# Patient Record
Sex: Female | Born: 1965 | Race: White | Hispanic: No | Marital: Single | Smoking: Former smoker
Health system: Southern US, Community
[De-identification: ages and names within clinical notes are randomized; demographics above are authoritative.]

## PROBLEM LIST (undated history)

## (undated) DIAGNOSIS — F329 Major depressive disorder, single episode, unspecified: Secondary | ICD-10-CM

## (undated) DIAGNOSIS — K589 Irritable bowel syndrome without diarrhea: Secondary | ICD-10-CM

## (undated) DIAGNOSIS — E7800 Pure hypercholesterolemia, unspecified: Secondary | ICD-10-CM

## (undated) DIAGNOSIS — I219 Acute myocardial infarction, unspecified: Secondary | ICD-10-CM

## (undated) DIAGNOSIS — F410 Panic disorder [episodic paroxysmal anxiety] without agoraphobia: Secondary | ICD-10-CM

## (undated) DIAGNOSIS — G43909 Migraine, unspecified, not intractable, without status migrainosus: Secondary | ICD-10-CM

## (undated) DIAGNOSIS — F419 Anxiety disorder, unspecified: Secondary | ICD-10-CM

## (undated) DIAGNOSIS — K219 Gastro-esophageal reflux disease without esophagitis: Secondary | ICD-10-CM

## (undated) HISTORY — PX: TONSILLECTOMY: SUR1361

## (undated) HISTORY — DX: Gastro-esophageal reflux disease without esophagitis: K21.9

## (undated) HISTORY — DX: Irritable bowel syndrome, unspecified: K58.9

## (undated) HISTORY — PX: CORONARY ANGIOPLASTY WITH STENT PLACEMENT: SHX49

---

## 1996-12-16 ENCOUNTER — Encounter: Payer: Self-pay | Admitting: Gastroenterology

## 2000-09-04 ENCOUNTER — Encounter: Payer: Self-pay | Admitting: Gastroenterology

## 2000-09-04 ENCOUNTER — Other Ambulatory Visit: Admission: RE | Admit: 2000-09-04 | Discharge: 2000-09-04 | Payer: Self-pay | Admitting: Gastroenterology

## 2000-09-04 ENCOUNTER — Encounter (INDEPENDENT_AMBULATORY_CARE_PROVIDER_SITE_OTHER): Payer: Self-pay | Admitting: Specialist

## 2005-09-26 ENCOUNTER — Ambulatory Visit: Payer: Self-pay | Admitting: Gastroenterology

## 2006-11-03 ENCOUNTER — Ambulatory Visit: Payer: Self-pay | Admitting: Gastroenterology

## 2007-12-30 ENCOUNTER — Ambulatory Visit: Payer: Self-pay | Admitting: Gastroenterology

## 2008-07-17 ENCOUNTER — Emergency Department (HOSPITAL_BASED_OUTPATIENT_CLINIC_OR_DEPARTMENT_OTHER): Admission: EM | Admit: 2008-07-17 | Discharge: 2008-07-17 | Payer: Self-pay | Admitting: Emergency Medicine

## 2008-10-28 ENCOUNTER — Emergency Department (HOSPITAL_BASED_OUTPATIENT_CLINIC_OR_DEPARTMENT_OTHER): Admission: EM | Admit: 2008-10-28 | Discharge: 2008-10-28 | Payer: Self-pay | Admitting: Emergency Medicine

## 2008-12-19 ENCOUNTER — Emergency Department (HOSPITAL_BASED_OUTPATIENT_CLINIC_OR_DEPARTMENT_OTHER): Admission: EM | Admit: 2008-12-19 | Discharge: 2008-12-19 | Payer: Self-pay | Admitting: Emergency Medicine

## 2009-02-20 ENCOUNTER — Ambulatory Visit: Payer: Self-pay | Admitting: Gastroenterology

## 2009-02-20 DIAGNOSIS — K219 Gastro-esophageal reflux disease without esophagitis: Secondary | ICD-10-CM | POA: Insufficient documentation

## 2009-02-20 DIAGNOSIS — K589 Irritable bowel syndrome without diarrhea: Secondary | ICD-10-CM

## 2009-03-11 ENCOUNTER — Emergency Department (HOSPITAL_BASED_OUTPATIENT_CLINIC_OR_DEPARTMENT_OTHER): Admission: EM | Admit: 2009-03-11 | Discharge: 2009-03-11 | Payer: Self-pay | Admitting: Emergency Medicine

## 2009-06-18 ENCOUNTER — Emergency Department (HOSPITAL_BASED_OUTPATIENT_CLINIC_OR_DEPARTMENT_OTHER): Admission: EM | Admit: 2009-06-18 | Discharge: 2009-06-18 | Payer: Self-pay | Admitting: Emergency Medicine

## 2009-10-15 ENCOUNTER — Emergency Department (HOSPITAL_BASED_OUTPATIENT_CLINIC_OR_DEPARTMENT_OTHER): Admission: EM | Admit: 2009-10-15 | Discharge: 2009-10-15 | Payer: Self-pay | Admitting: Emergency Medicine

## 2010-03-10 ENCOUNTER — Emergency Department (HOSPITAL_BASED_OUTPATIENT_CLINIC_OR_DEPARTMENT_OTHER): Admission: EM | Admit: 2010-03-10 | Discharge: 2010-03-10 | Payer: Self-pay | Admitting: Emergency Medicine

## 2010-09-10 ENCOUNTER — Emergency Department (HOSPITAL_BASED_OUTPATIENT_CLINIC_OR_DEPARTMENT_OTHER): Admission: EM | Admit: 2010-09-10 | Discharge: 2010-09-10 | Payer: Self-pay | Admitting: Emergency Medicine

## 2010-10-04 ENCOUNTER — Emergency Department (HOSPITAL_BASED_OUTPATIENT_CLINIC_OR_DEPARTMENT_OTHER): Admission: EM | Admit: 2010-10-04 | Discharge: 2010-10-04 | Payer: Self-pay | Admitting: Emergency Medicine

## 2010-11-02 ENCOUNTER — Ambulatory Visit (HOSPITAL_COMMUNITY)
Admission: RE | Admit: 2010-11-02 | Discharge: 2010-11-02 | Payer: Self-pay | Source: Home / Self Care | Attending: Obstetrics and Gynecology | Admitting: Obstetrics and Gynecology

## 2010-12-09 ENCOUNTER — Encounter: Payer: Self-pay | Admitting: Obstetrics and Gynecology

## 2011-01-10 ENCOUNTER — Emergency Department (HOSPITAL_BASED_OUTPATIENT_CLINIC_OR_DEPARTMENT_OTHER)
Admission: EM | Admit: 2011-01-10 | Discharge: 2011-01-10 | Disposition: A | Discharge status: Home / Self Care | Payer: PRIVATE HEALTH INSURANCE | Attending: Emergency Medicine | Admitting: Emergency Medicine

## 2011-01-10 DIAGNOSIS — G43909 Migraine, unspecified, not intractable, without status migrainosus: Secondary | ICD-10-CM | POA: Insufficient documentation

## 2011-01-10 DIAGNOSIS — K219 Gastro-esophageal reflux disease without esophagitis: Secondary | ICD-10-CM | POA: Insufficient documentation

## 2011-01-10 DIAGNOSIS — R110 Nausea: Secondary | ICD-10-CM | POA: Insufficient documentation

## 2011-01-10 DIAGNOSIS — E860 Dehydration: Secondary | ICD-10-CM | POA: Insufficient documentation

## 2011-02-02 ENCOUNTER — Emergency Department (HOSPITAL_BASED_OUTPATIENT_CLINIC_OR_DEPARTMENT_OTHER)
Admission: EM | Admit: 2011-02-02 | Discharge: 2011-02-02 | Disposition: A | Discharge status: Home / Self Care | Payer: PRIVATE HEALTH INSURANCE | Attending: Emergency Medicine | Admitting: Emergency Medicine

## 2011-02-02 DIAGNOSIS — K219 Gastro-esophageal reflux disease without esophagitis: Secondary | ICD-10-CM | POA: Insufficient documentation

## 2011-02-02 DIAGNOSIS — G43909 Migraine, unspecified, not intractable, without status migrainosus: Secondary | ICD-10-CM | POA: Insufficient documentation

## 2011-03-11 ENCOUNTER — Emergency Department (HOSPITAL_BASED_OUTPATIENT_CLINIC_OR_DEPARTMENT_OTHER)
Admission: EM | Admit: 2011-03-11 | Discharge: 2011-03-11 | Disposition: A | Discharge status: Home / Self Care | Payer: PRIVATE HEALTH INSURANCE | Attending: Emergency Medicine | Admitting: Emergency Medicine

## 2011-03-11 DIAGNOSIS — G43809 Other migraine, not intractable, without status migrainosus: Secondary | ICD-10-CM | POA: Insufficient documentation

## 2011-03-11 DIAGNOSIS — Z79899 Other long term (current) drug therapy: Secondary | ICD-10-CM | POA: Insufficient documentation

## 2011-03-11 DIAGNOSIS — K219 Gastro-esophageal reflux disease without esophagitis: Secondary | ICD-10-CM | POA: Insufficient documentation

## 2011-04-02 NOTE — Assessment & Plan Note (Signed)
[facility]                         GASTROENTEROLOGY OFFICE NOTE   Cassidy Miles, Cassidy Miles                       MRN:          [id number]  DATE:12/30/2007                            DOB:          1966/07/30    This is a return office visit for GERD and irritable bowel syndrome.  She recently ran out of Nexium and began taking Prilosec 20 mg OTC,  which has not been effective in controlling her symptoms.  She relates  no dysphagia, odynophagia or night-time reflux symptoms.  Her irritable  bowel syndrome has been relatively inactive.  She uses Librax on the  order of two to three times a month as needed.  She notes no change in  bowel habits, melena, hematochezia or change in stool caliber.  There is  no family history of colon cancer, colon polyps or inflammatory bowel  disease.   CURRENT MEDICATIONS:  Listed on the chart, updated and reviewed.   MEDICATION ALLERGIES:  BIAXIN, KEFLEX, PENICILLIN.   EXAM:  No acute distress.  Weight 134.6 pounds, blood pressure is 98/62, pulse 84 and regular.  CHEST:  Clear to auscultation bilaterally.  CARDIAC:  Regular rate and rhythm without murmurs appreciated.  ABDOMEN:  Soft and nontender with normoactive bowel sounds, no palpable  organomegaly, masses or hernias.   ASSESSMENT AND PLAN:  1. GERD.  Renew Nexium 40 mg p.o. q.a.m. for one year.  Continue      standard anti-reflux measures.  Return office visit one year.  2. Irritable bowel syndrome.  Minimal activity.  Continue Librax      b.i.d. p.r.n.  Return office visit one year.     Venita Lick. Russella Dar, MD, [facility]  Electronically Signed    MTS/MedQ  DD: 12/30/2007  DT: 12/31/2007  Job #: [id number]   cc:   S. Kyra Manges, M.D.

## 2011-04-05 NOTE — Assessment & Plan Note (Signed)
[facility]                         GASTROENTEROLOGY OFFICE NOTE   Cassidy Miles, Cassidy Miles                       MRN:          [id number]  DATE:11/03/2006                            DOB:          [birth year]    REASON FOR VISIT:  This is a return office visit for GERD and irritable  bowel syndrome.  She also notes two small lesions near her anus that  have intermittently been irritated with diarrhea.  She states the  lesions have persisted for several years without any significant  changes. She had her annual pelvic exam by Dr. Elana Alm in September and  he noted these areas as well but she was unsure as to what he found.  She continues to use Nexium on a daily basis to control her reflux  symptoms.  When she misses a day or two of medication, her symptoms  recur.  She has no dysphagia, odynophagia, change in bowel habits,  melena, hematochezia or change in stool caliber.  She has had problems  with alternating diarrhea and constipation.  These symptoms have been  present for many years and are relatively stable.  She does note that  her diarrhea increases when she is under more stress.   CURRENT MEDICATIONS:  Current medications listed on the chart-updated  and reviewed.   ALLERGIES:  BIAXIN, KEFLEX, PENICILLIN.   PHYSICAL EXAMINATION:  GENERAL APPEARANCE:  No acute distress.  VITAL SIGNS:  Weight 139 pounds,  blood pressure 110/66, pulse is 64 and  regular.  CHEST:  Clear to auscultation bilaterally.  CARDIAC:  Regular rate and rhythm without murmurs.  ABDOMEN:  Soft, nontender with normal active bowel sounds, no palpable  organomegaly, masses or hernias.  RECTAL:  Examination reveals two very small external skin tags.  No  internal lesions.  Hemoccult negative stool in the vault.   ASSESSMENT/PLAN:  1. Gastroesophageal reflux disease.  Continue Nexium 40 mg p.o. q.a.m.      along with standard antireflux measures.  Return office visit in      one  year.  2. Irritable bowel syndrome.  Dicyclomine 10 mg t.i.d. p.r.n.      abdominal pain, bloating or diarrhea.  3. External hemorrhoidal tags.  4. Routine colorectal cancer screening at age 45.     Venita Lick. Russella Dar, MD, [facility]  Electronically Signed    MTS/MedQ  DD: 11/03/2006  DT: 11/03/2006  Job #: [id number]

## 2011-04-09 ENCOUNTER — Emergency Department (HOSPITAL_BASED_OUTPATIENT_CLINIC_OR_DEPARTMENT_OTHER)
Admission: EM | Admit: 2011-04-09 | Discharge: 2011-04-09 | Disposition: A | Discharge status: Home / Self Care | Payer: PRIVATE HEALTH INSURANCE | Attending: Emergency Medicine | Admitting: Emergency Medicine

## 2011-04-09 DIAGNOSIS — G43909 Migraine, unspecified, not intractable, without status migrainosus: Secondary | ICD-10-CM | POA: Insufficient documentation

## 2011-04-09 DIAGNOSIS — K219 Gastro-esophageal reflux disease without esophagitis: Secondary | ICD-10-CM | POA: Insufficient documentation

## 2011-04-29 ENCOUNTER — Emergency Department (HOSPITAL_BASED_OUTPATIENT_CLINIC_OR_DEPARTMENT_OTHER)
Admission: EM | Admit: 2011-04-29 | Discharge: 2011-04-29 | Disposition: A | Discharge status: Home / Self Care | Payer: PRIVATE HEALTH INSURANCE | Attending: Emergency Medicine | Admitting: Emergency Medicine

## 2011-04-29 DIAGNOSIS — R51 Headache: Secondary | ICD-10-CM | POA: Insufficient documentation

## 2011-04-29 DIAGNOSIS — K219 Gastro-esophageal reflux disease without esophagitis: Secondary | ICD-10-CM | POA: Insufficient documentation

## 2011-06-20 ENCOUNTER — Encounter: Payer: Self-pay | Admitting: Family Medicine

## 2011-07-21 ENCOUNTER — Emergency Department (HOSPITAL_BASED_OUTPATIENT_CLINIC_OR_DEPARTMENT_OTHER)
Admission: EM | Admit: 2011-07-21 | Discharge: 2011-07-21 | Disposition: A | Discharge status: Home / Self Care | Payer: BC Managed Care – PPO | Attending: Emergency Medicine | Admitting: Emergency Medicine

## 2011-07-21 ENCOUNTER — Encounter (HOSPITAL_BASED_OUTPATIENT_CLINIC_OR_DEPARTMENT_OTHER): Payer: Self-pay | Admitting: *Deleted

## 2011-07-21 DIAGNOSIS — G43909 Migraine, unspecified, not intractable, without status migrainosus: Secondary | ICD-10-CM | POA: Insufficient documentation

## 2011-07-21 DIAGNOSIS — K219 Gastro-esophageal reflux disease without esophagitis: Secondary | ICD-10-CM | POA: Insufficient documentation

## 2011-07-21 DIAGNOSIS — E7800 Pure hypercholesterolemia, unspecified: Secondary | ICD-10-CM | POA: Insufficient documentation

## 2011-07-21 DIAGNOSIS — K589 Irritable bowel syndrome without diarrhea: Secondary | ICD-10-CM | POA: Insufficient documentation

## 2011-07-21 HISTORY — DX: Pure hypercholesterolemia, unspecified: E78.00

## 2011-07-21 HISTORY — DX: Migraine, unspecified, not intractable, without status migrainosus: G43.909

## 2011-07-21 MED ORDER — DIPHENHYDRAMINE HCL 50 MG/ML IJ SOLN
25.0000 mg | Freq: Once | INTRAMUSCULAR | Status: AC
  Administered 2011-07-21: 25 mg via INTRAVENOUS
  Filled 2011-07-21: qty 1

## 2011-07-21 MED ORDER — DEXAMETHASONE SODIUM PHOSPHATE 10 MG/ML IJ SOLN
10.0000 mg | Freq: Once | INTRAMUSCULAR | Status: AC
  Administered 2011-07-21: 10 mg via INTRAVENOUS
  Filled 2011-07-21: qty 1

## 2011-07-21 MED ORDER — HYDROMORPHONE HCL 1 MG/ML IJ SOLN
1.0000 mg | Freq: Once | INTRAMUSCULAR | Status: AC
  Administered 2011-07-21: 1 mg via INTRAVENOUS
  Filled 2011-07-21: qty 1

## 2011-07-21 MED ORDER — SODIUM CHLORIDE 0.9 % IV SOLN: 1000.0000 mL | INTRAVENOUS | Status: DC

## 2011-07-21 MED ORDER — METOCLOPRAMIDE HCL 5 MG/ML IJ SOLN
10.0000 mg | Freq: Once | INTRAMUSCULAR | Status: AC
  Administered 2011-07-21: 10 mg via INTRAVENOUS
  Filled 2011-07-21: qty 2

## 2011-07-21 MED ORDER — SODIUM CHLORIDE 0.9 % IV BOLUS (SEPSIS)
1000.0000 mL | Freq: Once | INTRAVENOUS | Status: AC
  Administered 2011-07-21: 1000 mL via INTRAVENOUS

## 2011-07-21 MED ORDER — KETOROLAC TROMETHAMINE 30 MG/ML IJ SOLN
30.0000 mg | Freq: Once | INTRAMUSCULAR | Status: AC
  Administered 2011-07-21: 30 mg via INTRAVENOUS
  Filled 2011-07-21: qty 1

## 2011-07-21 NOTE — ED Provider Notes (Signed)
Scribed for Dr. Fonnie Jarvis, the patient was seen in room 10. This chart was scribed by Hillery Hunter. This patient's care was started at 20:20.   History   CSN: [id number] Arrival date & time: 07/21/2011  7:54 PM  Chief Complaint  Patient presents with  . Migraine   The history is provided by the patient.    Cassidy Miles is a 45 y.o. female who presents to the Emergency Department complaining of headache similar to those she associates with chronic migraine history. She describes this headache as starting yesterday and gradually worsening, feeling "like a jackhammer", had visual aura of flashing lights today but no decreased vision on right side as she has had with some previous symptoms. She has had concurrent nausea and vomiting today, and has not been able to keep down medications at home. She denies weakness, numbness, coordination, dizziness, fever, rash, trauma, shortness of breath, abdominal pain, hallucinations.    Past Medical History  Diagnosis Date  . GERD (gastroesophageal reflux disease)   . IBS (irritable bowel syndrome)   . Migraines   . Hypercholesteremia     History reviewed. No pertinent past surgical history.  History reviewed. No pertinent family history.  History  Substance Use Topics  . Smoking status: Never Smoker   . Smokeless tobacco: Not on file  . Alcohol Use: No     Review of Systems  Constitutional: Negative for fever.  HENT: Negative for neck pain.   Eyes: Positive for photophobia and visual disturbance (visual aura but no change in vision). Negative for pain.  Respiratory: Negative for cough and shortness of breath.   Cardiovascular: Negative for chest pain.  Gastrointestinal: Positive for nausea and vomiting. Negative for abdominal pain and diarrhea.  Skin: Negative for rash.  Neurological: Positive for headaches. Negative for dizziness, speech difficulty, weakness, light-headedness and numbness.  Psychiatric/Behavioral: Negative  for hallucinations and confusion.  All other systems reviewed and are negative.    Physical Exam  BP 143/89  Pulse 132  Temp(Src) 99.1 F (37.3 C) (Oral)  Resp 20  Ht 5\' 6"  (1.676 m)  Wt 160 lb (72.576 kg)  BMI 25.82 kg/m2  SpO2 100%  LMP 07/21/2011  Physical Exam  Nursing note and vitals reviewed. Constitutional:       Awake, alert, nontoxic appearance with baseline speech for patient.  HENT:  Head: Atraumatic.  Mouth/Throat: Oropharynx is clear and moist.  Eyes: EOM are normal. Pupils are equal, round, and reactive to light. Right eye exhibits no discharge. Left eye exhibits no discharge.  Neck: Neck supple.       Mild posterior neck tenderness typical for patient  Cardiovascular: Normal rate and regular rhythm.   No murmur heard. Pulmonary/Chest: Effort normal and breath sounds normal. No respiratory distress.  Abdominal: Soft. There is no tenderness.  Musculoskeletal:       Baseline ROM, moves extremities with no obvious new focal weakness.  Neurological:       Awake, alert, cooperative and aware of situation; motor strength bilaterally; sensation normal to light touch bilaterally; major cranial nerves appear intact; romberg negative  Skin: No rash noted.  Psychiatric: She has a normal mood and affect.  Neuro normal F-N, periph fields intact, normal gait  ED Course  Procedures  OTHER DATA REVIEWED: Nursing notes, vital signs, and past medical records reviewed. Patient has been seen for similar symptoms about once per month. Patient has given conflicting histories of narcotic use during prior visits.  DIAGNOSTIC STUDIES: Oxygen Saturation is 100%  on room air, normal by my interpretation.     ED COURSE / COORDINATION OF CARE: 20:26. Ordered: IV NS 1L bolus, Reglan 10mg , Dilaudid 1mg , Benadryl 25mg , Toradol 30mg , Decadron 10mg  for pain control. 21:35. Patient feels much improved and is resting comfortably. Her spouse is present to provide her a ride  home.   MDM: I doubt any other EMC precluding discharge at this time including, but not necessarily limited to the following:SAH, CVA, SBI.  IMPRESSION: Diagnoses that have been ruled out:  Diagnoses that are still under consideration:  Final diagnoses:  Migraine, unspecified, without mention of intractable migraine without mention of status migrainosus    PLAN:  Discharge home in care of spouse I have reviewed the discharge instructions with the patient  CONDITION ON DISCHARGE: Stable and improved  MEDICATIONS GIVEN IN THE E.D.  Medications  sodium chloride 0.9 % bolus 1,000 mL (1000 mL Intravenous Given 07/21/11 2103)  metoCLOPramide (REGLAN) injection 10 mg (10 mg Intravenous Given 07/21/11 2104)  HYDROmorphone (DILAUDID) injection 1 mg (1 mg Intravenous Given 07/21/11 2105)  diphenhydrAMINE (BENADRYL) injection 25 mg (25 mg Intravenous Given 07/21/11 2104)  ketorolac (TORADOL) injection 30 mg (30 mg Intravenous Given 07/21/11 2104)  dexamethasone (DECADRON) injection 10 mg (10 mg Intravenous Given 07/21/11 2104)    SCRIBE ATTESTATION: I personally performed the services described in this documentation, which was scribed in my presence. The recorded information has been reviewed and considered. No att. providers found    Hurman Horn, MD 07/22/11 1311

## 2011-07-21 NOTE — ED Notes (Signed)
Pt states she has a hx of migraines and has had this one for 3 days. Also c/o N/V. Sensitivity to light.

## 2011-09-01 ENCOUNTER — Encounter (HOSPITAL_BASED_OUTPATIENT_CLINIC_OR_DEPARTMENT_OTHER): Payer: Self-pay | Admitting: *Deleted

## 2011-09-01 ENCOUNTER — Emergency Department (HOSPITAL_BASED_OUTPATIENT_CLINIC_OR_DEPARTMENT_OTHER)
Admission: EM | Admit: 2011-09-01 | Discharge: 2011-09-01 | Disposition: A | Discharge status: Home / Self Care | Payer: BC Managed Care – PPO | Attending: Emergency Medicine | Admitting: Emergency Medicine

## 2011-09-01 DIAGNOSIS — K589 Irritable bowel syndrome without diarrhea: Secondary | ICD-10-CM | POA: Insufficient documentation

## 2011-09-01 DIAGNOSIS — F17200 Nicotine dependence, unspecified, uncomplicated: Secondary | ICD-10-CM | POA: Insufficient documentation

## 2011-09-01 DIAGNOSIS — R51 Headache: Secondary | ICD-10-CM

## 2011-09-01 DIAGNOSIS — W108XXA Fall (on) (from) other stairs and steps, initial encounter: Secondary | ICD-10-CM | POA: Insufficient documentation

## 2011-09-01 DIAGNOSIS — E7800 Pure hypercholesterolemia, unspecified: Secondary | ICD-10-CM | POA: Insufficient documentation

## 2011-09-01 DIAGNOSIS — R112 Nausea with vomiting, unspecified: Secondary | ICD-10-CM

## 2011-09-01 DIAGNOSIS — K219 Gastro-esophageal reflux disease without esophagitis: Secondary | ICD-10-CM | POA: Insufficient documentation

## 2011-09-01 MED ORDER — HYDROMORPHONE HCL 1 MG/ML IJ SOLN
INTRAMUSCULAR | Status: AC
  Administered 2011-09-01: 1 mg via INTRAVENOUS
  Filled 2011-09-01: qty 1

## 2011-09-01 MED ORDER — DIPHENHYDRAMINE HCL 50 MG/ML IJ SOLN
12.5000 mg | Freq: Once | INTRAMUSCULAR | Status: AC
  Administered 2011-09-01: 12.5 mg via INTRAVENOUS
  Filled 2011-09-01: qty 1

## 2011-09-01 MED ORDER — SODIUM CHLORIDE 0.9 % IV BOLUS (SEPSIS)
1000.0000 mL | Freq: Once | INTRAVENOUS | Status: AC
  Administered 2011-09-01: 1000 mL via INTRAVENOUS

## 2011-09-01 MED ORDER — HYDROMORPHONE HCL 1 MG/ML IJ SOLN
1.0000 mg | Freq: Once | INTRAMUSCULAR | Status: AC
  Administered 2011-09-01: 1 mg via INTRAVENOUS

## 2011-09-01 MED ORDER — KETOROLAC TROMETHAMINE 30 MG/ML IJ SOLN
30.0000 mg | Freq: Once | INTRAMUSCULAR | Status: AC
  Administered 2011-09-01: 30 mg via INTRAVENOUS
  Filled 2011-09-01: qty 1

## 2011-09-01 MED ORDER — METHYLPREDNISOLONE SODIUM SUCC 125 MG IJ SOLR
250.0000 mg | Freq: Once | INTRAMUSCULAR | Status: AC
  Administered 2011-09-01: 250 mg via INTRAVENOUS
  Filled 2011-09-01: qty 4

## 2011-09-01 MED ORDER — METOCLOPRAMIDE HCL 5 MG/ML IJ SOLN
10.0000 mg | Freq: Once | INTRAMUSCULAR | Status: AC
  Administered 2011-09-01: 10 mg via INTRAVENOUS
  Filled 2011-09-01: qty 2

## 2011-09-01 NOTE — ED Notes (Signed)
Migraine since Friday, states that she fell on steps and started hurting soon after fall, took ibuprofen and vicodin yesterday, but no relief

## 2011-09-01 NOTE — ED Notes (Signed)
Patient stated that HA has been resolved and plans to go home to sleep

## 2011-09-01 NOTE — ED Provider Notes (Signed)
History     CSN: [id number] Arrival date & time: 09/01/2011 10:27 AM  Chief Complaint  Patient presents with  . Migraine    (Consider location/radiation/quality/duration/timing/severity/associated sxs/prior treatment) HPI Comments: Patient presents with 3 days of migraine headache. She believes it was started when she slipped going down the stairs in her neck twisted. She did not actually impact her head on the stairs or ground. There was no loss of consciousness. She initially had neck pain which is resolved itself and now has been left with her typical migraine headache. She has associated nausea and vomiting and photophobia with it. No fevers. No weakness, numbness, changes in speech or other neurologic deficits. She normally takes ibuprofen at home but that has not relieved his headache.  Patient is a 45 y.o. female presenting with migraine. The history is provided by the patient. No language interpreter was used.  Migraine This is a recurrent problem. The current episode started more than 2 days ago. The problem occurs constantly. The problem has not changed since onset.Associated symptoms include headaches. Pertinent negatives include no chest pain, no abdominal pain and no shortness of breath.    Past Medical History  Diagnosis Date  . GERD (gastroesophageal reflux disease)   . IBS (irritable bowel syndrome)   . Migraines   . Hypercholesteremia     History reviewed. No pertinent past surgical history.  No family history on file.  History  Substance Use Topics  . Smoking status: Current Some Day Smoker  . Smokeless tobacco: Not on file  . Alcohol Use: Yes    OB History    Grav Para Term Preterm Abortions TAB SAB Ect Mult Living                  Review of Systems  Constitutional: Negative.  Negative for fever and chills.  Eyes: Positive for photophobia. Negative for discharge and redness.  Respiratory: Negative.  Negative for cough and shortness of breath.     Cardiovascular: Negative.  Negative for chest pain.  Gastrointestinal: Positive for nausea and vomiting. Negative for abdominal pain and diarrhea.  Genitourinary: Negative.  Negative for dysuria and vaginal discharge.  Musculoskeletal: Negative.  Negative for back pain.  Skin: Negative.  Negative for color change and rash.  Neurological: Positive for headaches. Negative for syncope.  Hematological: Negative.  Negative for adenopathy.  Psychiatric/Behavioral: Negative.  Negative for confusion.  All other systems reviewed and are negative.    Allergies  Cephalexin; Clarithromycin; and Penicillins  Home Medications   Current Outpatient Rx  Name Route Sig Dispense Refill  . ATENOLOL 25 MG PO TABS Oral Take 25 mg by mouth daily.      Marland Kitchen VITAMIN D 1000 UNITS PO CAPS Oral Take 1,000 Units by mouth daily.      . DULOXETINE HCL 20 MG PO CPEP Oral Take 20 mg by mouth daily.      . DULOXETINE HCL 60 MG PO CPEP Oral Take 60 mg by mouth daily.      Marland Kitchen ESOMEPRAZOLE MAGNESIUM 40 MG PO CPDR Oral Take 40 mg by mouth daily before breakfast.      . HYDROCHLOROTHIAZIDE 25 MG PO TABS Oral Take 25 mg by mouth daily.      . IBUPROFEN 200 MG PO TABS Oral Take 800 mg by mouth once.        BP 113/65  Pulse 79  Temp(Src) 99.2 F (37.3 C) (Oral)  Resp 17  SpO2 99%  Physical Exam  Constitutional: She  is oriented to person, place, and time. She appears well-developed and well-nourished.  Non-toxic appearance. She does not have a sickly appearance.  HENT:  Head: Normocephalic and atraumatic.  Eyes: Conjunctivae, EOM and lids are normal. Pupils are equal, round, and reactive to light. No scleral icterus.  Neck: Trachea normal and normal range of motion. Neck supple.  Cardiovascular: Normal rate, regular rhythm and normal heart sounds.   Pulmonary/Chest: Effort normal and breath sounds normal. No respiratory distress. She has no wheezes. She has no rales. She exhibits no tenderness.  Abdominal: Soft.  Normal appearance. There is no tenderness. There is no rebound, no guarding and no CVA tenderness.  Musculoskeletal: Normal range of motion.  Neurological: She is alert and oriented to person, place, and time. She has normal strength.  Skin: Skin is warm, dry and intact. No rash noted.  Psychiatric: She has a normal mood and affect. Her behavior is normal. Thought content normal.    ED Course  Procedures (including critical care time)  Labs Reviewed - No data to display No results found.   No diagnosis found.    MDM  Patient with headache that is similar to her past migraines. This is not her worst headache of life. Given that this is been constant over the last 3 days and like her past headaches I believe subarachnoid hemorrhage is unlikely. She has no fevers or specific neck stiffness to expect that this is meningitis. Patient's symptoms did improve with the medications here and she wishes to go home at this time. No neurologic deficits.        Nat Christen, MD 09/01/11 [phone]

## 2011-09-22 ENCOUNTER — Emergency Department (HOSPITAL_BASED_OUTPATIENT_CLINIC_OR_DEPARTMENT_OTHER)
Admission: EM | Admit: 2011-09-22 | Discharge: 2011-09-22 | Disposition: A | Discharge status: Home / Self Care | Payer: BC Managed Care – PPO | Attending: Emergency Medicine | Admitting: Emergency Medicine

## 2011-09-22 ENCOUNTER — Encounter (HOSPITAL_BASED_OUTPATIENT_CLINIC_OR_DEPARTMENT_OTHER): Payer: Self-pay

## 2011-09-22 DIAGNOSIS — G43909 Migraine, unspecified, not intractable, without status migrainosus: Secondary | ICD-10-CM | POA: Insufficient documentation

## 2011-09-22 DIAGNOSIS — Z79899 Other long term (current) drug therapy: Secondary | ICD-10-CM | POA: Insufficient documentation

## 2011-09-22 DIAGNOSIS — E7800 Pure hypercholesterolemia, unspecified: Secondary | ICD-10-CM | POA: Insufficient documentation

## 2011-09-22 DIAGNOSIS — K589 Irritable bowel syndrome without diarrhea: Secondary | ICD-10-CM | POA: Insufficient documentation

## 2011-09-22 DIAGNOSIS — K219 Gastro-esophageal reflux disease without esophagitis: Secondary | ICD-10-CM | POA: Insufficient documentation

## 2011-09-22 MED ORDER — HYDROMORPHONE HCL PF 1 MG/ML IJ SOLN
1.0000 mg | Freq: Once | INTRAMUSCULAR | Status: AC
  Administered 2011-09-22: 1 mg via INTRAVENOUS
  Filled 2011-09-22: qty 1

## 2011-09-22 MED ORDER — METOCLOPRAMIDE HCL 5 MG/ML IJ SOLN
10.0000 mg | Freq: Once | INTRAMUSCULAR | Status: AC
  Administered 2011-09-22: 10 mg via INTRAVENOUS
  Filled 2011-09-22: qty 2

## 2011-09-22 MED ORDER — DIPHENHYDRAMINE HCL 50 MG/ML IJ SOLN
25.0000 mg | Freq: Once | INTRAMUSCULAR | Status: AC
  Administered 2011-09-22: 25 mg via INTRAVENOUS
  Filled 2011-09-22: qty 1

## 2011-09-22 MED ORDER — HYDROMORPHONE HCL PF 2 MG/ML IJ SOLN
2.0000 mg | Freq: Once | INTRAMUSCULAR | Status: AC
  Administered 2011-09-22: 2 mg via INTRAVENOUS
  Filled 2011-09-22: qty 1

## 2011-09-22 MED ORDER — SODIUM CHLORIDE 0.9 % IV BOLUS (SEPSIS)
1000.0000 mL | Freq: Once | INTRAVENOUS | Status: AC
  Administered 2011-09-22: 1000 mL via INTRAVENOUS

## 2011-09-22 MED ORDER — KETOROLAC TROMETHAMINE 30 MG/ML IJ SOLN
30.0000 mg | Freq: Once | INTRAMUSCULAR | Status: AC
  Administered 2011-09-22: 30 mg via INTRAVENOUS
  Filled 2011-09-22: qty 1

## 2011-09-22 NOTE — ED Notes (Signed)
Pt states that she has had migraine since Thursday night.  Pt states that she was taking Topamax for prophylaxis but she has been experiencing side effects and stopped taking the medication.  Pt c/o photo/phonophobia.  Pt states that toradol/dilaudid/decadron/benadryl works for her migraines.

## 2011-09-22 NOTE — ED Provider Notes (Signed)
History     CSN: [id number] Arrival date & time: 09/22/2011  6:36 AM   First MD Initiated Contact with Patient 09/22/11 [phone]      Chief Complaint  Patient presents with  . Migraine    (Consider location/radiation/quality/duration/timing/severity/associated sxs/prior treatment) HPI  45 year old female history of migraines presents with headache. The patient complains of 2 days of throbbing right-sided headache. She complained of photophobia and phonophobia. She denies fever, chills, neck stiffness. She states the headache is Cassidy Miles 10. She states there is nothing atypical about her pain and that her severe migraines are usually this intense. She is having some nausea and she vomited yesterday. This is typical of her migraines also. She had been taking Topamax for prophylaxis but stopped approximately 3 months ago secondary to perioral and distal extremity paresthesias. She states that her headaches have increased in frequency since that time. She has been seen by neurology previously and is followed by headache Center in [place] for management. She's taken her ibuprofen at home without relief of her symptoms. States that her symptoms are usually controlled in the emergency department with IV fluids, Benadryl, Toradol, Dilaudid +/1 decadron.   Maryruth Hancock, RN 09/22/2011 06:31    Pt states that she has had migraine since Thursday night. Pt states that she was taking Topamax for prophylaxis but she has been experiencing side effects and stopped taking the medication. Pt c/o photo/phonophobia. Pt states that toradol/dilaudid/decadron/benadryl works for her migraines.       Past Medical History  Diagnosis Date  . GERD (gastroesophageal reflux disease)   . IBS (irritable bowel syndrome)   . Migraines   . Hypercholesteremia     History reviewed. No pertinent past surgical history.  History reviewed. No pertinent family history.  History  Substance Use Topics  . Smoking status:  Current Some Day Smoker    Types: Cigarettes  . Smokeless tobacco: Not on file  . Alcohol Use: Yes     occasional use, last drink about 1 month ago    OB History    Grav Para Term Preterm Abortions TAB SAB Ect Mult Living                  Review of Systems  All other systems reviewed and are negative.  except as noted HPI   Allergies  Cephalexin; Clarithromycin; and Penicillins  Home Medications   Current Outpatient Rx  Name Route Sig Dispense Refill  . ATENOLOL 25 MG PO TABS Oral Take 25 mg by mouth daily.      . DULOXETINE HCL 20 MG PO CPEP Oral Take 20 mg by mouth daily.      Marland Kitchen ESOMEPRAZOLE MAGNESIUM 40 MG PO CPDR Oral Take 40 mg by mouth daily before breakfast.      . HYDROCHLOROTHIAZIDE 25 MG PO TABS Oral Take 25 mg by mouth daily.      . IBUPROFEN 200 MG PO TABS Oral Take 800 mg by mouth once.      Marland Kitchen VITAMIN D 1000 UNITS PO CAPS Oral Take 1,000 Units by mouth daily.      . DULOXETINE HCL 60 MG PO CPEP Oral Take 60 mg by mouth daily.        BP 135/88  Pulse 90  Temp(Src) 98.2 F (36.8 C) (Oral)  Resp 16  Ht 5\' 6"  (1.676 m)  Wt 150 lb (68.04 kg)  BMI 24.21 kg/m2  SpO2 100%  LMP 09/08/2011  Physical Exam  Nursing note and vitals reviewed. Constitutional:  She is oriented to person, place, and time. She appears well-developed.       Patient sitting in dark room with sunglasses on  HENT:  Head: Atraumatic.  Mouth/Throat: Oropharynx is clear and moist.  Eyes: Conjunctivae and EOM are normal. Pupils are equal, round, and reactive to light.       Photophobia  Neck: Normal range of motion. Neck supple.       No meningismus  Cardiovascular: Normal rate, regular rhythm, normal heart sounds and intact distal pulses.   Pulmonary/Chest: Effort normal and breath sounds normal. No respiratory distress. She has no wheezes. She has no rales.  Abdominal: Soft. She exhibits no distension. There is no tenderness. There is no rebound and no guarding.  Musculoskeletal:  Normal range of motion.  Neurological: She is alert and oriented to person, place, and time. No cranial nerve deficit. Coordination normal.       strength 5 out of 5 all extremities  Skin: Skin is warm and dry. No rash noted.  Psychiatric: She has a normal mood and affect.    ED Course  Procedures (including critical care time)  Labs Reviewed - No data to display No results found.   No diagnosis found.    MDM  45 year old female history of migraine presents with two-day headache consistent with her typical migraine. There is no suspicion for subarachnoid hemorrhage, meningitis. Will treat initially with IVF, reglan, benadryl, toradol. Hold narcotics for now, dilaudid if headache not improved.   Stefano Gaul, MD     7:36 AM Pt continues with headache in right parietal region with associated photophobia and phonophobia, not relieved by initial migraine cocktail.  Dilaudid 2 mg IV ordered.    8:35 AM Pain now a 7. Will give 1 mg IV Dilaudid and then release pt.    Carleene Cooper III, MD 09/22/11 [phone]

## 2011-10-30 ENCOUNTER — Encounter (HOSPITAL_BASED_OUTPATIENT_CLINIC_OR_DEPARTMENT_OTHER): Payer: Self-pay | Admitting: *Deleted

## 2011-10-30 ENCOUNTER — Emergency Department (HOSPITAL_BASED_OUTPATIENT_CLINIC_OR_DEPARTMENT_OTHER)
Admission: EM | Admit: 2011-10-30 | Discharge: 2011-10-30 | Disposition: A | Discharge status: Other Acute Inpatient Hospital | Payer: BC Managed Care – PPO | Attending: Emergency Medicine | Admitting: Emergency Medicine

## 2011-10-30 DIAGNOSIS — F329 Major depressive disorder, single episode, unspecified: Secondary | ICD-10-CM

## 2011-10-30 DIAGNOSIS — K219 Gastro-esophageal reflux disease without esophagitis: Secondary | ICD-10-CM | POA: Insufficient documentation

## 2011-10-30 DIAGNOSIS — Z79899 Other long term (current) drug therapy: Secondary | ICD-10-CM | POA: Insufficient documentation

## 2011-10-30 DIAGNOSIS — E7800 Pure hypercholesterolemia, unspecified: Secondary | ICD-10-CM | POA: Insufficient documentation

## 2011-10-30 DIAGNOSIS — K589 Irritable bowel syndrome without diarrhea: Secondary | ICD-10-CM | POA: Insufficient documentation

## 2011-10-30 DIAGNOSIS — F411 Generalized anxiety disorder: Secondary | ICD-10-CM | POA: Insufficient documentation

## 2011-10-30 DIAGNOSIS — F3289 Other specified depressive episodes: Secondary | ICD-10-CM | POA: Insufficient documentation

## 2011-10-30 DIAGNOSIS — F17200 Nicotine dependence, unspecified, uncomplicated: Secondary | ICD-10-CM | POA: Insufficient documentation

## 2011-10-30 DIAGNOSIS — R45851 Suicidal ideations: Secondary | ICD-10-CM

## 2011-10-30 LAB — CBC
MCH: 30.8 pg (ref 26.0–34.0)
MCV: 89 fL (ref 78.0–100.0)
Platelets: 460 10*3/uL — ABNORMAL HIGH (ref 150–400)
RBC: 4.45 MIL/uL (ref 3.87–5.11)

## 2011-10-30 LAB — RAPID URINE DRUG SCREEN, HOSP PERFORMED
Barbiturates: NOT DETECTED
Cocaine: NOT DETECTED
Tetrahydrocannabinol: NOT DETECTED

## 2011-10-30 LAB — URINALYSIS, ROUTINE W REFLEX MICROSCOPIC
Bilirubin Urine: NEGATIVE
Ketones, ur: NEGATIVE mg/dL
Protein, ur: NEGATIVE mg/dL
Urobilinogen, UA: 0.2 mg/dL (ref 0.0–1.0)

## 2011-10-30 LAB — URINE MICROSCOPIC-ADD ON

## 2011-10-30 LAB — BASIC METABOLIC PANEL
BUN: 14 mg/dL (ref 6–23)
Calcium: 9.9 mg/dL (ref 8.4–10.5)
Creatinine, Ser: 0.6 mg/dL (ref 0.50–1.10)
GFR calc non Af Amer: >90 mL/min (ref >90)
Glucose, Bld: 147 mg/dL — ABNORMAL HIGH (ref 70–99)
Sodium: 136 mEq/L (ref 135–145)

## 2011-10-30 LAB — DIFFERENTIAL
Eosinophils Absolute: 0 10*3/uL (ref 0.0–0.7)
Eosinophils Relative: 0 % (ref 0–5)
Lymphs Abs: 1.1 10*3/uL (ref 0.7–4.0)
Monocytes Absolute: 0.5 10*3/uL (ref 0.1–1.0)

## 2011-10-30 MED ORDER — LORAZEPAM 2 MG/ML IJ SOLN
1.0000 mg | Freq: Once | INTRAMUSCULAR | Status: AC
  Administered 2011-10-30: 2 mg via INTRAMUSCULAR

## 2011-10-30 MED ORDER — LORAZEPAM 1 MG PO TABS
1.0000 mg | ORAL_TABLET | Freq: Three times a day (TID) | ORAL | Status: DC | PRN
  Administered 2011-10-30: 1 mg via ORAL
  Filled 2011-10-30: qty 1

## 2011-10-30 MED ORDER — IBUPROFEN 800 MG PO TABS
ORAL_TABLET | ORAL | Status: AC
  Administered 2011-10-30: 800 mg
  Filled 2011-10-30: qty 1

## 2011-10-30 MED ORDER — ONDANSETRON HCL 8 MG PO TABS: 4.0000 mg | ORAL_TABLET | Freq: Three times a day (TID) | ORAL | Status: DC | PRN

## 2011-10-30 MED ORDER — LORAZEPAM 2 MG/ML IJ SOLN
INTRAMUSCULAR | Status: AC
  Administered 2011-10-30: 2 mg via INTRAMUSCULAR
  Filled 2011-10-30: qty 1

## 2011-10-30 MED ORDER — IBUPROFEN 400 MG PO TABS: 600.0000 mg | ORAL_TABLET | Freq: Three times a day (TID) | ORAL | Status: DC | PRN

## 2011-10-30 MED ORDER — TETANUS-DIPHTH-ACELL PERTUSSIS 5-2.5-18.5 LF-MCG/0.5 IM SUSP
0.5000 mL | Freq: Once | INTRAMUSCULAR | Status: AC
  Administered 2011-10-30: 0.5 mL via INTRAMUSCULAR
  Filled 2011-10-30: qty 0.5

## 2011-10-30 MED ORDER — ACETAMINOPHEN 325 MG PO TABS
650.0000 mg | ORAL_TABLET | ORAL | Status: DC | PRN
  Filled 2011-10-30: qty 2

## 2011-10-30 MED ORDER — NICOTINE 21 MG/24HR TD PT24
21.0000 mg | MEDICATED_PATCH | Freq: Every day | TRANSDERMAL | Status: DC
  Filled 2011-10-30: qty 1

## 2011-10-30 MED ORDER — LORAZEPAM 1 MG PO TABS
2.0000 mg | ORAL_TABLET | Freq: Once | ORAL | Status: AC
  Administered 2011-10-30: 2 mg via ORAL
  Filled 2011-10-30: qty 2

## 2011-10-30 NOTE — ED Notes (Signed)
MD at bedside. 

## 2011-10-30 NOTE — ED Notes (Signed)
Called [employer] to verify transport, and they stated they would send an officer from third shift out. States the next shift starts in the next half hour, and they will try to send someone out shortly after that.

## 2011-10-30 NOTE — ED Notes (Signed)
Dawn, [facility] at bedside.

## 2011-10-30 NOTE — ED Notes (Signed)
Pt. came  in ED with complaint of severe anxiety , claimed that she  attempted to cut right anterior wrist with regular kitchen knife , site noted with a non penetration laceration , no active bleeding at this time. Pt. Claimed of severe depression for the past months and is being medicated with anti-anxiety medications. Anxiety and depression worsen today with  With fa,ily  Issues as pt. also reported.

## 2011-10-30 NOTE — ED Notes (Signed)
Pt sleeping and awakens to voice.  Respiration unlabored and skin warm and dry.  Dawn with [facility] in ED and speaking with spouse.

## 2011-10-30 NOTE — ED Notes (Signed)
Pt transported by [employer] to [facility]. Pt leaving now. No change in pt condition.

## 2011-10-30 NOTE — ED Notes (Signed)
Sitter at bedside. Warm blanket provided.

## 2011-10-30 NOTE — ED Notes (Signed)
No change in pt assessment.  Awaiting transportation to [facility]. Sitter remains at bedside.

## 2011-10-30 NOTE — ED Notes (Signed)
Notified staffing , shauna, of need for sitter for this pt and spoke with candy AC at [place] to notified of need of sitter

## 2011-10-30 NOTE — ED Notes (Signed)
Pt did not have a IV in her left AC upon arrival.

## 2011-10-30 NOTE — ED Notes (Addendum)
Upon arrival to room pt removed her clothing.  Blue Jeans, black gap shirt, unc tarheel coat, white shoes and she removed her rings and ear rings which was placed in a cup and sealed. Pt placed in blue scrubs. I took the above belongings and pink purse to her mother n law in the lobby which was who the patient request I give them to.  Witnessed by Earnie Larsson, nursing security.

## 2011-10-30 NOTE — ED Notes (Signed)
[employer] at bedside to transport pt to [facility]

## 2011-10-30 NOTE — ED Notes (Signed)
Pt sleeping on left lateral side.  Respirations unlabored, skin warm and dry.  Pt declines Nicotine patch or tylenol.  Sitter remains at bedside.

## 2011-10-30 NOTE — ED Notes (Signed)
Pt remains anxious, rocking back and forth in bed and hyperventilating.  Reassurance provided and spouse at bedside.

## 2011-10-30 NOTE — ED Notes (Signed)
Demographics and H&P provided to [facility].

## 2011-10-30 NOTE — ED Provider Notes (Addendum)
History     CSN: [id number] Arrival date & time: 10/30/2011  9:25 AM   First MD Initiated Contact with Patient 10/30/11 [phone]      Chief Complaint  Patient presents with  . Panic Attack  . Suicidal    (Consider location/radiation/quality/duration/timing/severity/associated sxs/prior treatment) HPI Comments: Patiently recently saw her doctor last week and was switched from her Cymbalta to Paxil due to worsening depression anxiety. However since starting the Paxil she has gotten worsening panic attacks. Today the panic attacks got so bad that she was out of Clonopin and she felt she could not go on so she started to cut her wrist however she decided with a bad idea and only superficially scratched her wrist. She denies ever being suicidal before but has been abusing the Klonopin and Percocet and Vicodin.  Patient is a 45 y.o. female presenting with anxiety. The history is provided by the patient.  Anxiety This is a recurrent problem. Episode onset: Worsened over the last week. The problem occurs constantly. The problem has been gradually worsening. Pertinent negatives include no chest pain, no abdominal pain, no headaches and no shortness of breath. The symptoms are aggravated by stress. The symptoms are relieved by nothing. Treatments tried: Antidepressants. The treatment provided no relief.    Past Medical History  Diagnosis Date  . GERD (gastroesophageal reflux disease)   . IBS (irritable bowel syndrome)   . Migraines   . Hypercholesteremia     No past surgical history on file.  No family history on file.  History  Substance Use Topics  . Smoking status: Current Some Day Smoker    Types: Cigarettes  . Smokeless tobacco: Not on file  . Alcohol Use: Yes     occasional use, last drink about 1 month ago    OB History    Grav Para Term Preterm Abortions TAB SAB Ect Mult Living                  Review of Systems  Respiratory: Negative for shortness of breath.     Cardiovascular: Negative for chest pain.  Gastrointestinal: Negative for abdominal pain.  Neurological: Negative for headaches.  All other systems reviewed and are negative.    Allergies  Cephalexin; Clarithromycin; and Penicillins  Home Medications   Current Outpatient Rx  Name Route Sig Dispense Refill  . ATENOLOL 25 MG PO TABS Oral Take 25 mg by mouth daily.      Marland Kitchen VITAMIN D 1000 UNITS PO CAPS Oral Take 1,000 Units by mouth daily.      . DULOXETINE HCL 20 MG PO CPEP Oral Take 20 mg by mouth daily.      . DULOXETINE HCL 60 MG PO CPEP Oral Take 60 mg by mouth daily.      Marland Kitchen ESOMEPRAZOLE MAGNESIUM 40 MG PO CPDR Oral Take 40 mg by mouth daily before breakfast.      . HYDROCHLOROTHIAZIDE 25 MG PO TABS Oral Take 25 mg by mouth daily.      . IBUPROFEN 200 MG PO TABS Oral Take 800 mg by mouth once.        There were no vitals taken for this visit.  Physical Exam  Nursing note and vitals reviewed. Constitutional: She is oriented to person, place, and time. She appears well-developed and well-nourished. She appears distressed.       Anxious and trembling  HENT:  Head: Normocephalic and atraumatic.  Eyes: EOM are normal. Pupils are equal, round, and reactive to light.  Cardiovascular: Normal rate, regular rhythm, normal heart sounds and intact distal pulses.  Exam reveals no friction rub.   No murmur heard. Pulmonary/Chest: Effort normal and breath sounds normal. She has no wheezes. She has no rales.  Abdominal: Soft. Bowel sounds are normal. She exhibits no distension. There is no tenderness. There is no rebound and no guarding.  Musculoskeletal: Normal range of motion. She exhibits no tenderness.       Arms:      No edema. Superficial abrasion to the right volar wrist  Neurological: She is alert and oriented to person, place, and time. No cranial nerve deficit.  Skin: Skin is warm and dry. No rash noted.  Psychiatric: Her behavior is normal. Her mood appears anxious. She exhibits  a depressed mood. She expresses suicidal ideation.       Trembling on exam    ED Course  Procedures (including critical care time)  Results for orders placed during the hospital encounter of 10/30/11  CBC      Component Value Range   WBC 13.8 (*) 4.0 - 10.5 (K/uL)   RBC 4.45  3.87 - 5.11 (MIL/uL)   Hemoglobin 13.7  12.0 - 15.0 (g/dL)   HCT 46.9  62.9 - 52.8 (%)   MCV 89.0  78.0 - 100.0 (fL)   MCH 30.8  26.0 - 34.0 (pg)   MCHC 34.6  30.0 - 36.0 (g/dL)   RDW 41.3 (*) 24.4 - 15.5 (%)   Platelets 460 (*) 150 - 400 (K/uL)  DIFFERENTIAL      Component Value Range   Neutrophils Relative 88 (*) 43 - 77 (%)   Neutro Abs 12.2 (*) 1.7 - 7.7 (K/uL)   Lymphocytes Relative 8 (*) 12 - 46 (%)   Lymphs Abs 1.1  0.7 - 4.0 (K/uL)   Monocytes Relative 3  3 - 12 (%)   Monocytes Absolute 0.5  0.1 - 1.0 (K/uL)   Eosinophils Relative 0  0 - 5 (%)   Eosinophils Absolute 0.0  0.0 - 0.7 (K/uL)   Basophils Relative 0  0 - 1 (%)   Basophils Absolute 0.0  0.0 - 0.1 (K/uL)  BASIC METABOLIC PANEL      Component Value Range   Sodium 136  135 - 145 (mEq/L)   Potassium 3.7  3.5 - 5.1 (mEq/L)   Chloride 98  96 - 112 (mEq/L)   CO2 25  19 - 32 (mEq/L)   Glucose, Bld 147 (*) 70 - 99 (mg/dL)   BUN 14  6 - 23 (mg/dL)   Creatinine, Ser 0.10  0.50 - 1.10 (mg/dL)   Calcium 9.9  8.4 - 27.2 (mg/dL)   GFR calc non Af Amer >90  >90 (mL/min)   GFR calc Af Amer >90  >90 (mL/min)  URINALYSIS, ROUTINE W REFLEX MICROSCOPIC      Component Value Range   Color, Urine YELLOW  YELLOW    APPearance CLEAR  CLEAR    Specific Gravity, Urine 1.031 (*) 1.005 - 1.030    pH 5.5  5.0 - 8.0    Glucose, UA NEGATIVE  NEGATIVE (mg/dL)   Hgb urine dipstick TRACE (*) NEGATIVE    Bilirubin Urine NEGATIVE  NEGATIVE    Ketones, ur NEGATIVE  NEGATIVE (mg/dL)   Protein, ur NEGATIVE  NEGATIVE (mg/dL)   Urobilinogen, UA 0.2  0.0 - 1.0 (mg/dL)   Nitrite NEGATIVE  NEGATIVE    Leukocytes, UA NEGATIVE  NEGATIVE   ETHANOL      Component Value  Range   Alcohol, Ethyl (B) <11  0 - 11 (mg/dL)  URINE RAPID DRUG SCREEN (HOSP PERFORMED)      Component Value Range   Opiates POSITIVE (*) NONE DETECTED    Cocaine NONE DETECTED  NONE DETECTED    Benzodiazepines POSITIVE (*) NONE DETECTED    Amphetamines NONE DETECTED  NONE DETECTED    Tetrahydrocannabinol NONE DETECTED  NONE DETECTED    Barbiturates NONE DETECTED  NONE DETECTED   URINE MICROSCOPIC-ADD ON      Component Value Range   Squamous Epithelial / LPF RARE  RARE    WBC, UA 0-2  <3 (WBC/hpf)   RBC / HPF 0-2  <3 (RBC/hpf)   Bacteria, UA RARE  RARE   PREGNANCY, URINE      Component Value Range   Preg Test, Ur NEGATIVE     No results found.    No diagnosis found.    MDM     patient with a history of anxiety and recently was changed from her Cymbalta to Paxil due to worsening symptoms on the Cymbalta and over the last week has had worsening panic attacks and has used all the Klonopin she was given. Today she developed a severe panic attack and got to the point where she thought that there is nothing that she could do other than kill herself and she superficially cut her wrist. On exam here she is shaky and anxious but denies other symptoms. She states she just can't on my chest. She denies ever being hospitalized for anxiety or depression in the past and denies ever attempting suicide in the past.  Medically clear with normal labs patient given by mouth Ativan here to help her calm down and will have a marked, and assess patient for further recommendations.  [facility] evaluated and felt the patient needed hospitalization which I agree. Will IVC patient and currently looking for placement.  Gwyneth Sprout, MD 10/30/11 1331  Gwyneth Sprout, MD 10/30/11 1538

## 2011-10-30 NOTE — ED Notes (Signed)
Spoke with Annabelle Harman from [facility] and she will be in ED within the hour.  Pt is resting quietly, anxiety is improving however she reports a headache.

## 2011-10-30 NOTE — ED Notes (Signed)
Labs collected and sent to lab

## 2011-10-30 NOTE — ED Notes (Signed)
Dawn with [facility] remains at beside.

## 2011-10-30 NOTE — ED Notes (Signed)
Bed acceptance to [facility] received. [facility] department to be called for transport.

## 2011-11-21 ENCOUNTER — Emergency Department (HOSPITAL_BASED_OUTPATIENT_CLINIC_OR_DEPARTMENT_OTHER)
Admission: EM | Admit: 2011-11-21 | Discharge: 2011-11-22 | Disposition: A | Discharge status: Home / Self Care | Payer: BC Managed Care – PPO | Attending: Emergency Medicine | Admitting: Emergency Medicine

## 2011-11-21 ENCOUNTER — Encounter (HOSPITAL_BASED_OUTPATIENT_CLINIC_OR_DEPARTMENT_OTHER): Payer: Self-pay | Admitting: *Deleted

## 2011-11-21 DIAGNOSIS — R51 Headache: Secondary | ICD-10-CM

## 2011-11-21 DIAGNOSIS — K589 Irritable bowel syndrome without diarrhea: Secondary | ICD-10-CM | POA: Insufficient documentation

## 2011-11-21 DIAGNOSIS — K219 Gastro-esophageal reflux disease without esophagitis: Secondary | ICD-10-CM | POA: Insufficient documentation

## 2011-11-21 DIAGNOSIS — F341 Dysthymic disorder: Secondary | ICD-10-CM | POA: Insufficient documentation

## 2011-11-21 DIAGNOSIS — E7800 Pure hypercholesterolemia, unspecified: Secondary | ICD-10-CM | POA: Insufficient documentation

## 2011-11-21 HISTORY — DX: Anxiety disorder, unspecified: F41.9

## 2011-11-21 HISTORY — DX: Major depressive disorder, single episode, unspecified: F32.9

## 2011-11-21 NOTE — ED Notes (Signed)
Pt. Reports she was seen 6 wks ago here at med center and received Dilaudid, decadron, phenergan for pain.

## 2011-11-21 NOTE — ED Notes (Signed)
C/o severe migraine since Tuesday. Photosensitivity. N/V. Hx of same.

## 2011-11-22 ENCOUNTER — Encounter (HOSPITAL_COMMUNITY): Payer: Self-pay

## 2011-11-22 ENCOUNTER — Emergency Department (INDEPENDENT_AMBULATORY_CARE_PROVIDER_SITE_OTHER)
Admission: EM | Admit: 2011-11-22 | Discharge: 2011-11-22 | Discharge status: Against Medical Advice | Payer: BC Managed Care – PPO | Source: Home / Self Care | Attending: Family Medicine | Admitting: Family Medicine

## 2011-11-22 DIAGNOSIS — S139XXA Sprain of joints and ligaments of unspecified parts of neck, initial encounter: Secondary | ICD-10-CM

## 2011-11-22 MED ORDER — HYDROMORPHONE HCL PF 1 MG/ML IJ SOLN
1.0000 mg | Freq: Once | INTRAMUSCULAR | Status: AC
  Administered 2011-11-22: 1 mg via INTRAVENOUS
  Filled 2011-11-22: qty 1

## 2011-11-22 MED ORDER — DIPHENHYDRAMINE HCL 50 MG/ML IJ SOLN
25.0000 mg | Freq: Once | INTRAMUSCULAR | Status: AC
  Administered 2011-11-22: 25 mg via INTRAVENOUS
  Filled 2011-11-22: qty 1

## 2011-11-22 MED ORDER — SODIUM CHLORIDE 0.9 % IV BOLUS (SEPSIS)
1000.0000 mL | Freq: Once | INTRAVENOUS | Status: AC
  Administered 2011-11-22: 1000 mL via INTRAVENOUS

## 2011-11-22 MED ORDER — PROMETHAZINE HCL 25 MG/ML IJ SOLN
25.0000 mg | INTRAMUSCULAR | Status: AC
  Administered 2011-11-22: 25 mg via INTRAVENOUS
  Filled 2011-11-22: qty 1

## 2011-11-22 MED ORDER — NAPROXEN 500 MG PO TABS: 500.0000 mg | ORAL_TABLET | Freq: Two times a day (BID) | ORAL | Status: AC

## 2011-11-22 NOTE — ED Notes (Signed)
When I called this pt from the waiting area, she got out of the wheelchair in which she was sitting, and ambulated to the treatment area/exam room.

## 2011-11-22 NOTE — ED Notes (Signed)
Family at bedside. 

## 2011-11-22 NOTE — ED Notes (Signed)
MD at bedside. 

## 2011-11-22 NOTE — ED Provider Notes (Signed)
History     CSN: [id number]  Arrival date & time 11/21/11  2236   First MD Initiated Contact with Patient 11/22/11 0002      Chief Complaint  Patient presents with  . Migraine    (Consider location/radiation/quality/duration/timing/severity/associated sxs/prior treatment) HPI Comments: Patient is a 46 year old female with a history of anxiety and depression and migraine disorder. She presents with a complaint of a headache. Onset was Tuesday, symptoms are constant, feels like a "jackhammer" on the right parietal part of the scalp. She states that this is a classic migraine for her, associated with nausea vomiting and photophobia. She denies recent antibiotics, fevers chills cough or diarrhea. She endorses similar headaches almost weekly and usually clears with ibuprofen and rest. Today she had no improvement with ibuprofen. She denies fever, stiff neck, rash, weakness, numbness, change in vision.  Patient admits to being treated at a headache wellness [facility] in the past, was given narcotic medications with some improvement but denies current narcotic prescriptions at home.  Husband presents after patient received medications and states that she has had a severe headache today, she did not want to come to the hospital but he insisted.  Husband and endorses that she takes only Tylenol and ibuprofen at this time  Patient is a 46 y.o. female presenting with migraine. The history is provided by the patient and medical records.  Migraine    Past Medical History  Diagnosis Date  . GERD (gastroesophageal reflux disease)   . IBS (irritable bowel syndrome)   . Migraines   . Hypercholesteremia   . Anxiety and depression     History reviewed. No pertinent past surgical history.  No family history on file.  History  Substance Use Topics  . Smoking status: Current Some Day Smoker    Types: Cigarettes  . Smokeless tobacco: Not on file  . Alcohol Use: Yes     occasional use, last drink  about 1 month ago    OB History    Grav Para Term Preterm Abortions TAB SAB Ect Mult Living                  Review of Systems  All other systems reviewed and are negative.    Allergies  Cephalexin; Clarithromycin; Dilaudid; and Penicillins  Home Medications   Current Outpatient Rx  Name Route Sig Dispense Refill  . ATENOLOL 25 MG PO TABS Oral Take 25 mg by mouth 2 (two) times daily.     Marland Kitchen CLONAZEPAM 2 MG PO TABS Oral Take 0.5 mg by mouth 3 (three) times daily.     Marland Kitchen ESOMEPRAZOLE MAGNESIUM 40 MG PO CPDR Oral Take 40 mg by mouth daily as needed. For acid reflux    . HYDROCHLOROTHIAZIDE 25 MG PO TABS Oral Take 25 mg by mouth daily.      . IBUPROFEN 200 MG PO TABS Oral Take 800 mg by mouth every 6 (six) hours as needed. For pain    . PAROXETINE HCL ER 37.5 MG PO TB24 Oral Take 50 mg by mouth every morning.     Marland Kitchen NAPROXEN 500 MG PO TABS Oral Take 1 tablet (500 mg total) by mouth 2 (two) times daily with a meal. 30 tablet 0    BP 101/67  Pulse 78  Temp(Src) 98.3 F (36.8 C) (Oral)  Resp 18  Ht 5\' 6"  (1.676 m)  Wt 150 lb (68.04 kg)  BMI 24.21 kg/m2  SpO2 100%  LMP 11/07/2011  Physical Exam  Nursing note  and vitals reviewed. Constitutional: She appears well-developed and well-nourished. No distress.  HENT:  Head: Normocephalic and atraumatic.  Mouth/Throat: Oropharynx is clear and moist. No oropharyngeal exudate.  Eyes: Conjunctivae and EOM are normal. Pupils are equal, round, and reactive to light. Right eye exhibits no discharge. Left eye exhibits no discharge. No scleral icterus.  Neck: Normal range of motion. Neck supple. No JVD present. No thyromegaly present.  Cardiovascular: Normal rate, regular rhythm, normal heart sounds and intact distal pulses.  Exam reveals no gallop and no friction rub.   No murmur heard. Pulmonary/Chest: Effort normal and breath sounds normal. No respiratory distress. She has no wheezes. She has no rales.  Abdominal: Soft. Bowel sounds are  normal. She exhibits no distension and no mass. There is no tenderness.  Musculoskeletal: Normal range of motion. She exhibits no edema and no tenderness.  Lymphadenopathy:    She has no cervical adenopathy.  Neurological: She is alert. Coordination normal.       Neurologic exam:  Speech clear, pupils equal round reactive to light, extraocular movements intact  Normal peripheral visual fields Cranial nerves III through XII normal including no facial droop Follows commands, moves all extremities x4, normal strength to bilateral upper and lower extremities at all major muscle groups including grip Sensation normal to light touch and pinprick Coordination intact, no limb ataxia, finger-nose-finger normal Rapid alternating movements normal No pronator drift Gait normal   Skin: Skin is warm and dry. No rash noted. No erythema.  Psychiatric: She has a normal mood and affect. Her behavior is normal.    ED Course  Procedures (including critical care time)  Labs Reviewed - No data to display No results found.   1. Headache       MDM  On initial exam, patient appeared to be in mild discomfort with headache, normal neurologic exam and states that migraine is similar to past headaches. No focal neurologic deficits seen, medications including hydromorphone, Phenergan, Benadryl given as patient states this has worked for her in the past.  1:00 AM, patient reevaluated and found to have some abnormal behavior, had undressed and was thinking that the computer on the wall was her computer and was trying to plug it in. She has since improved and now that the husband is here he states that she has had similar reactions to hydromorphone in the past. I will add this to her allergy list, although her mental status to clear so that she is safe for discharge. Her headache has completely cleared  2:10 AM, patient reevaluated and found to have normal behavior, headache still remains gone, vital signs  normal. Patient's spouse states that he feels comfortable taking her home with observation through the night.  Discharge prescriptions  #1 Naprosyn   Vida Roller, MD 11/22/11 [phone]
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
## 2011-11-22 NOTE — ED Notes (Signed)
Pt. Asking when she can leave after meds were given to her.  Explained to pt. She needs to wait till she is discharged by EDP.  Pt. In no distress and has become very sleepy after meds given to her.
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
## 2011-11-22 NOTE — ED Notes (Signed)
Patient was examined by MD, and after exam and discussion of plan of care, patient  And spouse came to desk asking to sign out of the clinic. Form was completed, and although pt was offered chance to stay and compete tests, she declined to do so, she wanted to go home and lay down and rest. Agreeable, NAD, ambulating w/o observable difficulty. Was inst to return if she changes her mind and wishes to be evaluated, and can return at any time.  Was observed to be exiting the clinic parking lot at a brisk, unencumbered pace
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
## 2011-11-22 NOTE — ED Notes (Signed)
Into Pt. Room to reassess Pt.   The Pt. Was out of the bed at the wall computer with her clothes off and the gown off.  Pt. Did have her pants on.  Pt. Reported to RN "I am trying to get the lap top off the wall."  Pt. Husband called to room to watch the pt.   Pt. Also grabbing in the air.  Pt. Pulled out the IV she had in place.  Pt. Placed back into bed by RNs.  No distress noted in Pt.

## 2011-11-22 NOTE — ED Notes (Signed)
RN went back into Pt. Room approx. 10 mins ago to reassess Pt. And let her know the Dr. Laury Axon to see her before she will be discharged.  Pt. Husband at bedside

## 2012-12-19 ENCOUNTER — Emergency Department (HOSPITAL_COMMUNITY)
Admission: EM | Admit: 2012-12-19 | Discharge: 2012-12-19 | Disposition: A | Discharge status: Home / Self Care | Payer: Medicaid Other | Attending: Emergency Medicine | Admitting: Emergency Medicine

## 2012-12-19 ENCOUNTER — Encounter (HOSPITAL_COMMUNITY): Payer: Self-pay | Admitting: Emergency Medicine

## 2012-12-19 DIAGNOSIS — G43909 Migraine, unspecified, not intractable, without status migrainosus: Secondary | ICD-10-CM | POA: Insufficient documentation

## 2012-12-19 DIAGNOSIS — Y929 Unspecified place or not applicable: Secondary | ICD-10-CM | POA: Insufficient documentation

## 2012-12-19 DIAGNOSIS — K589 Irritable bowel syndrome without diarrhea: Secondary | ICD-10-CM | POA: Insufficient documentation

## 2012-12-19 DIAGNOSIS — F341 Dysthymic disorder: Secondary | ICD-10-CM | POA: Insufficient documentation

## 2012-12-19 DIAGNOSIS — E7800 Pure hypercholesterolemia, unspecified: Secondary | ICD-10-CM | POA: Insufficient documentation

## 2012-12-19 DIAGNOSIS — Y9389 Activity, other specified: Secondary | ICD-10-CM | POA: Insufficient documentation

## 2012-12-19 DIAGNOSIS — H5500 Unspecified nystagmus: Secondary | ICD-10-CM | POA: Insufficient documentation

## 2012-12-19 DIAGNOSIS — R269 Unspecified abnormalities of gait and mobility: Secondary | ICD-10-CM | POA: Insufficient documentation

## 2012-12-19 DIAGNOSIS — Z79899 Other long term (current) drug therapy: Secondary | ICD-10-CM | POA: Insufficient documentation

## 2012-12-19 DIAGNOSIS — F17200 Nicotine dependence, unspecified, uncomplicated: Secondary | ICD-10-CM | POA: Insufficient documentation

## 2012-12-19 DIAGNOSIS — R51 Headache: Secondary | ICD-10-CM

## 2012-12-19 DIAGNOSIS — K219 Gastro-esophageal reflux disease without esophagitis: Secondary | ICD-10-CM | POA: Insufficient documentation

## 2012-12-19 DIAGNOSIS — R112 Nausea with vomiting, unspecified: Secondary | ICD-10-CM | POA: Insufficient documentation

## 2012-12-19 MED ORDER — DEXAMETHASONE SODIUM PHOSPHATE 10 MG/ML IJ SOLN
10.0000 mg | Freq: Once | INTRAMUSCULAR | Status: AC
  Administered 2012-12-19: 10 mg via INTRAVENOUS
  Filled 2012-12-19: qty 1

## 2012-12-19 MED ORDER — SODIUM CHLORIDE 0.9 % IV SOLN
INTRAVENOUS | Status: DC
  Administered 2012-12-19: 18:00:00 via INTRAVENOUS

## 2012-12-19 MED ORDER — ONDANSETRON 8 MG PO TBDP: ORAL_TABLET | ORAL | Status: DC

## 2012-12-19 MED ORDER — HYDROMORPHONE HCL PF 1 MG/ML IJ SOLN
1.0000 mg | Freq: Once | INTRAMUSCULAR | Status: AC
  Administered 2012-12-19: 1 mg via INTRAVENOUS
  Filled 2012-12-19: qty 1

## 2012-12-19 MED ORDER — METOCLOPRAMIDE HCL 5 MG/ML IJ SOLN
10.0000 mg | Freq: Once | INTRAMUSCULAR | Status: AC
  Administered 2012-12-19: 10 mg via INTRAVENOUS
  Filled 2012-12-19: qty 2

## 2012-12-19 MED ORDER — SODIUM CHLORIDE 0.9 % IV BOLUS (SEPSIS)
1000.0000 mL | Freq: Once | INTRAVENOUS | Status: AC
  Administered 2012-12-19: 1000 mL via INTRAVENOUS

## 2012-12-19 MED ORDER — METOCLOPRAMIDE HCL 10 MG PO TABS
10.0000 mg | ORAL_TABLET | Freq: Four times a day (QID) | ORAL | Status: DC | PRN
Start: 2012-12-19 — End: 2013-01-09

## 2012-12-19 MED ORDER — DIPHENHYDRAMINE HCL 50 MG/ML IJ SOLN
25.0000 mg | Freq: Once | INTRAMUSCULAR | Status: AC
  Administered 2012-12-19: 25 mg via INTRAVENOUS
  Filled 2012-12-19: qty 1
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
## 2012-12-19 NOTE — ED Notes (Signed)
HQI:ON62<XB> Expected date:<BR> Expected time:<BR> Means of arrival:<BR> Comments:<BR> [facility] triage 4
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
## 2012-12-19 NOTE — ED Notes (Signed)
Dr. Fonnie Jarvis in to assess pt for d/c home

## 2013-01-09 ENCOUNTER — Encounter (HOSPITAL_COMMUNITY): Payer: Self-pay | Admitting: Emergency Medicine

## 2013-01-09 ENCOUNTER — Emergency Department (HOSPITAL_COMMUNITY)
Admission: EM | Admit: 2013-01-09 | Discharge: 2013-01-09 | Disposition: A | Discharge status: Home / Self Care | Payer: Medicaid Other | Attending: Emergency Medicine | Admitting: Emergency Medicine

## 2013-01-09 ENCOUNTER — Emergency Department (HOSPITAL_COMMUNITY): Payer: Medicaid Other

## 2013-01-09 DIAGNOSIS — Z79899 Other long term (current) drug therapy: Secondary | ICD-10-CM | POA: Insufficient documentation

## 2013-01-09 DIAGNOSIS — Z8719 Personal history of other diseases of the digestive system: Secondary | ICD-10-CM | POA: Insufficient documentation

## 2013-01-09 DIAGNOSIS — G43909 Migraine, unspecified, not intractable, without status migrainosus: Secondary | ICD-10-CM | POA: Insufficient documentation

## 2013-01-09 DIAGNOSIS — F329 Major depressive disorder, single episode, unspecified: Secondary | ICD-10-CM | POA: Insufficient documentation

## 2013-01-09 DIAGNOSIS — R0789 Other chest pain: Secondary | ICD-10-CM | POA: Insufficient documentation

## 2013-01-09 DIAGNOSIS — F17200 Nicotine dependence, unspecified, uncomplicated: Secondary | ICD-10-CM | POA: Insufficient documentation

## 2013-01-09 DIAGNOSIS — Z3202 Encounter for pregnancy test, result negative: Secondary | ICD-10-CM | POA: Insufficient documentation

## 2013-01-09 DIAGNOSIS — K219 Gastro-esophageal reflux disease without esophagitis: Secondary | ICD-10-CM | POA: Insufficient documentation

## 2013-01-09 DIAGNOSIS — F410 Panic disorder [episodic paroxysmal anxiety] without agoraphobia: Secondary | ICD-10-CM | POA: Insufficient documentation

## 2013-01-09 DIAGNOSIS — R110 Nausea: Secondary | ICD-10-CM | POA: Insufficient documentation

## 2013-01-09 DIAGNOSIS — F3289 Other specified depressive episodes: Secondary | ICD-10-CM | POA: Insufficient documentation

## 2013-01-09 DIAGNOSIS — E7800 Pure hypercholesterolemia, unspecified: Secondary | ICD-10-CM | POA: Insufficient documentation

## 2013-01-09 DIAGNOSIS — R0602 Shortness of breath: Secondary | ICD-10-CM | POA: Insufficient documentation

## 2013-01-09 LAB — URINALYSIS, ROUTINE W REFLEX MICROSCOPIC
Leukocytes, UA: NEGATIVE
Nitrite: NEGATIVE
Specific Gravity, Urine: 1.035 — ABNORMAL HIGH (ref 1.005–1.030)
Urobilinogen, UA: 0.2 mg/dL (ref 0.0–1.0)
pH: 6 (ref 5.0–8.0)

## 2013-01-09 LAB — CBC
Hemoglobin: 13.5 g/dL (ref 12.0–15.0)
MCH: 30.2 pg (ref 26.0–34.0)
MCHC: 35.2 g/dL (ref 30.0–36.0)
MCV: 85.9 fL (ref 78.0–100.0)
RBC: 4.47 MIL/uL (ref 3.87–5.11)

## 2013-01-09 LAB — BASIC METABOLIC PANEL
BUN: 13 mg/dL (ref 6–23)
CO2: 23 mEq/L (ref 19–32)
Calcium: 8.8 mg/dL (ref 8.4–10.5)
Creatinine, Ser: 0.66 mg/dL (ref 0.50–1.10)
GFR calc non Af Amer: >90 mL/min (ref >90)
Glucose, Bld: 126 mg/dL — ABNORMAL HIGH (ref 70–99)

## 2013-01-09 LAB — POCT PREGNANCY, URINE: Preg Test, Ur: NEGATIVE

## 2013-01-09 LAB — URINE MICROSCOPIC-ADD ON

## 2013-01-09 LAB — POCT I-STAT TROPONIN I: Troponin i, poc: 0.01 ng/mL (ref 0.00–0.08)

## 2013-01-09 LAB — POCT I-STAT, CHEM 8
BUN: 12 mg/dL (ref 6–23)
Calcium, Ion: 1.12 mmol/L (ref 1.12–1.23)
Glucose, Bld: 108 mg/dL — ABNORMAL HIGH (ref 70–99)
HCT: 39 % (ref 36.0–46.0)
TCO2: 22 mmol/L (ref 0–100)

## 2013-01-09 MED ORDER — DEXAMETHASONE SODIUM PHOSPHATE 10 MG/ML IJ SOLN
10.0000 mg | Freq: Once | INTRAMUSCULAR | Status: AC
  Administered 2013-01-09: 10 mg via INTRAVENOUS
  Filled 2013-01-09: qty 1

## 2013-01-09 MED ORDER — DIPHENHYDRAMINE HCL 50 MG/ML IJ SOLN
25.0000 mg | Freq: Once | INTRAMUSCULAR | Status: AC
  Administered 2013-01-09: 25 mg via INTRAVENOUS
  Filled 2013-01-09: qty 1

## 2013-01-09 MED ORDER — LORAZEPAM 1 MG PO TABS: 1.0000 mg | ORAL_TABLET | Freq: Three times a day (TID) | ORAL | Status: DC | PRN

## 2013-01-09 MED ORDER — METOCLOPRAMIDE HCL 5 MG/ML IJ SOLN
10.0000 mg | Freq: Once | INTRAMUSCULAR | Status: AC
  Administered 2013-01-09: 10 mg via INTRAVENOUS
  Filled 2013-01-09: qty 2

## 2013-01-09 MED ORDER — HYDROMORPHONE HCL PF 1 MG/ML IJ SOLN
1.0000 mg | Freq: Once | INTRAMUSCULAR | Status: AC
  Administered 2013-01-09: 1 mg via INTRAVENOUS
  Filled 2013-01-09: qty 1

## 2013-01-09 MED ORDER — PROMETHAZINE HCL 25 MG PO TABS: 25.0000 mg | ORAL_TABLET | Freq: Four times a day (QID) | ORAL | Status: DC | PRN

## 2013-01-09 MED ORDER — LORAZEPAM 2 MG/ML IJ SOLN
1.0000 mg | Freq: Once | INTRAMUSCULAR | Status: AC
  Administered 2013-01-09: 1 mg via INTRAVENOUS
  Filled 2013-01-09: qty 1

## 2013-01-09 NOTE — ED Provider Notes (Addendum)
History     CSN: [id number]  Arrival date & time 01/09/13  4098   First MD Initiated Contact with Patient 01/09/13 [phone]      Chief Complaint  Patient presents with  . Chest Pain    (Consider location/radiation/quality/duration/timing/severity/associated sxs/prior treatment) Patient is a 47 y.o. female presenting with chest pain. The history is provided by the patient.  Chest Pain Pain location:  Substernal area Pain quality: pressure, radiating and tightness   Pain radiates to:  L arm Pain radiates to the back: no   Pain severity:  Moderate Onset quality:  Sudden (woke her up at 2am) Duration:  5 hours Timing:  Constant Progression:  Unchanged Chronicity:  Recurrent Context: breathing and stress   Context comment:  States this occured in the past with panic attack but worse today than normal.  Pt states also out of clonipin Relieved by:  Nothing Worsened by:  Nothing tried Ineffective treatments:  None tried Associated symptoms: anxiety, headache, nausea and shortness of breath   Associated symptoms: no abdominal pain, no cough, no fever, no numbness, no syncope, not vomiting and no weakness   Risk factors: smoking   Risk factors: no birth control, no coronary artery disease, no diabetes mellitus, no high cholesterol, no hypertension and not pregnant     Past Medical History  Diagnosis Date  . GERD (gastroesophageal reflux disease)   . IBS (irritable bowel syndrome)   . Migraines   . Hypercholesteremia   . Anxiety and depression     History reviewed. No pertinent past surgical history.  No family history on file.  History  Substance Use Topics  . Smoking status: Current Some Day Smoker    Types: Cigarettes  . Smokeless tobacco: Not on file  . Alcohol Use: Yes     Comment: occasional use, last drink about 1 month ago    OB History   Grav Para Term Preterm Abortions TAB SAB Ect Mult Living                  Review of Systems  Constitutional: Negative  for fever.  Respiratory: Positive for shortness of breath. Negative for cough.   Cardiovascular: Positive for chest pain. Negative for syncope.  Gastrointestinal: Positive for nausea. Negative for vomiting and abdominal pain.  Genitourinary: Negative for dysuria and vaginal discharge.  Neurological: Positive for headaches. Negative for weakness and numbness.  All other systems reviewed and are negative.    Allergies  Cephalexin; Clarithromycin; and Penicillins  Home Medications   Current Outpatient Rx  Name  Route  Sig  Dispense  Refill  . atenolol (TENORMIN) 25 MG tablet   Oral   Take 25 mg by mouth 2 (two) times daily.          . clonazePAM (KLONOPIN) 2 MG tablet   Oral   Take 0.5 mg by mouth 3 (three) times daily.          Marland Kitchen esomeprazole (NEXIUM) 40 MG capsule   Oral   Take 40 mg by mouth daily as needed. For acid reflux         . hydrochlorothiazide 25 MG tablet   Oral   Take 25 mg by mouth daily.           Marland Kitchen ibuprofen (ADVIL,MOTRIN) 200 MG tablet   Oral   Take 800 mg by mouth every 6 (six) hours as needed. For pain         . metoCLOPramide (REGLAN) 10 MG tablet  Oral   Take 1 tablet (10 mg total) by mouth every 6 (six) hours as needed (nausea/headache).   6 tablet   0   . ondansetron (ZOFRAN ODT) 8 MG disintegrating tablet      8mg  ODT q4 hours prn nausea   4 tablet   0   . PARoxetine (PAXIL-CR) 37.5 MG 24 hr tablet   Oral   Take 50 mg by mouth every morning.            BP 126/48  Pulse 101  Resp 24  SpO2 97%  Physical Exam  Nursing note and vitals reviewed. Constitutional: She is oriented to person, place, and time. She appears well-developed and well-nourished. She appears distressed.  HENT:  Head: Normocephalic and atraumatic.  Mouth/Throat: Oropharynx is clear and moist.  Eyes: Conjunctivae and EOM are normal. Pupils are equal, round, and reactive to light.  Neck: Normal range of motion. Neck supple.  Cardiovascular: Normal  rate, regular rhythm and intact distal pulses.   No murmur heard. Pulmonary/Chest: Breath sounds normal. Tachypnea noted. No respiratory distress. She has no wheezes. She has no rales. She exhibits no tenderness.  hyperventilating  Abdominal: Soft. She exhibits no distension. There is no tenderness. There is no rebound and no guarding.  Musculoskeletal: Normal range of motion. She exhibits no edema and no tenderness.  Neurological: She is alert and oriented to person, place, and time.  Skin: Skin is warm and dry. No rash noted. No erythema.  Psychiatric: Her behavior is normal. Her mood appears anxious.    ED Course  Procedures (including critical care time)  Labs Reviewed  CBC - Abnormal; Notable for the following:    WBC 11.4 (*)    Platelets 473 (*)    All other components within normal limits  BASIC METABOLIC PANEL - Abnormal; Notable for the following:    Glucose, Bld 126 (*)    All other components within normal limits  URINALYSIS, ROUTINE W REFLEX MICROSCOPIC - Abnormal; Notable for the following:    APPearance CLOUDY (*)    Specific Gravity, Urine 1.035 (*)    Hgb urine dipstick SMALL (*)    Bilirubin Urine SMALL (*)    Ketones, ur 15 (*)    Protein, ur 30 (*)    All other components within normal limits  URINE MICROSCOPIC-ADD ON - Abnormal; Notable for the following:    Squamous Epithelial / LPF MANY (*)    Bacteria, UA FEW (*)    All other components within normal limits  POCT I-STAT, CHEM 8 - Abnormal; Notable for the following:    Glucose, Bld 108 (*)    All other components within normal limits  POCT I-STAT TROPONIN I  POCT PREGNANCY, URINE  POCT I-STAT TROPONIN I   Dg Chest 2 View  01/09/2013  *RADIOLOGY REPORT*  Clinical Data:  Chest pain and shortness of breath.  CHEST - 2 VIEW  Comparison: None  Findings: The heart size and mediastinal contours are within normal limits.  Both lungs are clear.  The visualized skeletal structures are unremarkable.  IMPRESSION:  No active disease.   Original Report Authenticated By: Irish Lack, M.D.      Date: 01/09/2013  Rate: 96  Rhythm: normal sinus rhythm  QRS Axis: normal  Intervals: normal  ST/T Wave abnormalities: normal  Conduction Disutrbances:none  Narrative Interpretation:   Old EKG Reviewed: none available    1. Migraine   2. Panic attack       MDM  Pt with atypical story for CP that started at 2 am.  States feels like anxiety but worse than normal.  Out of her clonipin.  Also states having her typical migraine HA for the last 2 days.  TIMI 0 and no risk factors.  PERC neg.  EKG wnl.  CXR, CBC, i-stat, trop pending.  7:50 AM Labs and CXR wnl.  EKG wnl.  AFter migraine cocktail HA persists and pt requesting dilaudid.  10:38 AM HA improved and d/ced home.     Gwyneth Sprout, MD 01/09/13 1038  Gwyneth Sprout, MD 01/09/13 1040

## 2013-01-09 NOTE — ED Notes (Signed)
Pt. Ambulated to the bathroom,  Gait steady. Urine specimen obtained

## 2013-01-09 NOTE — ED Notes (Signed)
Pt complaining of headache during nurse assessment. Pt has hx of migraines. States this feels like on.

## 2013-01-09 NOTE — ED Notes (Signed)
Per EMS, pt started having Left sided CP this AM approx 02000. Pt received 325asa and nitro x3. Pt cp went from 9 to 8. Pt is reporting SOB, but seems to be hyperventilating. Pt does have a hx of anxiety, and states that the pain is similar, but hurts more. Pt is a/o x4. Pt is having nausea, but no reports of vomiting.

## 2013-01-14 ENCOUNTER — Encounter (HOSPITAL_COMMUNITY): Payer: Self-pay

## 2013-01-14 ENCOUNTER — Emergency Department (HOSPITAL_COMMUNITY)
Admission: EM | Admit: 2013-01-14 | Discharge: 2013-01-14 | Disposition: A | Discharge status: Home / Self Care | Payer: Medicaid Other | Attending: Emergency Medicine | Admitting: Emergency Medicine

## 2013-01-14 DIAGNOSIS — Z79899 Other long term (current) drug therapy: Secondary | ICD-10-CM | POA: Insufficient documentation

## 2013-01-14 DIAGNOSIS — F419 Anxiety disorder, unspecified: Secondary | ICD-10-CM

## 2013-01-14 DIAGNOSIS — F411 Generalized anxiety disorder: Secondary | ICD-10-CM | POA: Insufficient documentation

## 2013-01-14 DIAGNOSIS — Z8719 Personal history of other diseases of the digestive system: Secondary | ICD-10-CM | POA: Insufficient documentation

## 2013-01-14 DIAGNOSIS — R0602 Shortness of breath: Secondary | ICD-10-CM | POA: Insufficient documentation

## 2013-01-14 DIAGNOSIS — F3289 Other specified depressive episodes: Secondary | ICD-10-CM | POA: Insufficient documentation

## 2013-01-14 DIAGNOSIS — F17200 Nicotine dependence, unspecified, uncomplicated: Secondary | ICD-10-CM | POA: Insufficient documentation

## 2013-01-14 DIAGNOSIS — R110 Nausea: Secondary | ICD-10-CM | POA: Insufficient documentation

## 2013-01-14 DIAGNOSIS — R51 Headache: Secondary | ICD-10-CM | POA: Insufficient documentation

## 2013-01-14 DIAGNOSIS — E7800 Pure hypercholesterolemia, unspecified: Secondary | ICD-10-CM | POA: Insufficient documentation

## 2013-01-14 HISTORY — DX: Panic disorder (episodic paroxysmal anxiety): F41.0

## 2013-01-14 MED ORDER — SODIUM CHLORIDE 0.9 % IV BOLUS (SEPSIS)
1000.0000 mL | Freq: Once | INTRAVENOUS | Status: AC
  Administered 2013-01-14: 1000 mL via INTRAVENOUS

## 2013-01-14 MED ORDER — CLONAZEPAM 0.5 MG PO TABS: 1.0000 mg | ORAL_TABLET | Freq: Three times a day (TID) | ORAL | Status: DC | PRN

## 2013-01-14 MED ORDER — HYDROMORPHONE HCL PF 1 MG/ML IJ SOLN
1.0000 mg | Freq: Once | INTRAMUSCULAR | Status: AC
  Administered 2013-01-14: 1 mg via INTRAVENOUS
  Filled 2013-01-14: qty 1

## 2013-01-14 MED ORDER — ONDANSETRON HCL 4 MG/2ML IJ SOLN
4.0000 mg | Freq: Once | INTRAMUSCULAR | Status: AC
  Administered 2013-01-14: 4 mg via INTRAVENOUS
  Filled 2013-01-14: qty 2

## 2013-01-14 MED ORDER — MELOXICAM 7.5 MG PO TABS: 7.5000 mg | ORAL_TABLET | Freq: Every day | ORAL | Status: DC

## 2013-01-14 MED ORDER — ONDANSETRON 4 MG PO TBDP: 8.0000 mg | ORAL_TABLET | Freq: Once | ORAL | Status: DC

## 2013-01-14 MED ORDER — LORAZEPAM 1 MG PO TABS: 2.0000 mg | ORAL_TABLET | Freq: Once | ORAL | Status: DC

## 2013-01-14 MED ORDER — KETOROLAC TROMETHAMINE 30 MG/ML IJ SOLN
30.0000 mg | Freq: Once | INTRAMUSCULAR | Status: AC
  Administered 2013-01-14: 30 mg via INTRAVENOUS
  Filled 2013-01-14: qty 1

## 2013-01-14 MED ORDER — HYDROMORPHONE HCL PF 1 MG/ML IJ SOLN: 1.0000 mg | Freq: Once | INTRAMUSCULAR | Status: DC

## 2013-01-14 MED ORDER — LORAZEPAM 2 MG/ML IJ SOLN
1.0000 mg | Freq: Once | INTRAMUSCULAR | Status: AC
  Administered 2013-01-14: 1 mg via INTRAVENOUS
  Filled 2013-01-14: qty 1

## 2013-01-14 NOTE — ED Notes (Signed)
The patient states that she has been unable to sleep since she woke up at 0100.  She says that she advanced to a full-blown panic attack at 0400.  The patient was seen earlier this week for the same complaint, and she was prescribed Xanax 1 mg, TID, prn for anxiety.  She has been taking this medicine, but she says it is not working.

## 2013-01-14 NOTE — ED Provider Notes (Signed)
History     CSN: [id number]  Arrival date & time 01/14/13  4098   First MD Initiated Contact with Patient 01/14/13 [phone]      Chief Complaint  Patient presents with  . Panic Attack    (Consider location/radiation/quality/duration/timing/severity/associated sxs/prior treatment) HPI Comments: 47 yo F cc anxiety. Patient has long history of generalized anxiety and depression. Was seen here earlier given Ativan, had been on Klonopin, is not working at home. She is having no suicidal ideations and has no attempts. States also has headache from 3 days ago. Long history of migraines and this is a typical migraine. Right side, throbbing. Denies any neurologic deficits, and no fever, chills, neck stiffness. She has lost her job and her house and is now living with a friend which has triggered her anxiety. She also reports her migraine headache is making her anxiety worse. She wishes for symptomatic treatment.  The history is provided by the patient. No language interpreter was used.    Past Medical History  Diagnosis Date  . GERD (gastroesophageal reflux disease)   . IBS (irritable bowel syndrome)   . Migraines   . Hypercholesteremia   . Anxiety and depression   . Panic attack     No past surgical history on file.  No family history on file.  History  Substance Use Topics  . Smoking status: Current Some Day Smoker    Types: Cigarettes  . Smokeless tobacco: Not on file  . Alcohol Use: Yes     Comment: occasional use, last drink about 1 month ago    OB History   Grav Para Term Preterm Abortions TAB SAB Ect Mult Living                  Review of Systems  Constitutional: Negative for fever and chills.  HENT: Negative for sore throat and neck pain.   Eyes: Negative for visual disturbance.  Respiratory: Positive for shortness of breath. Negative for cough.        States has had some hyperventilation.  Cardiovascular: Negative for chest pain.  Gastrointestinal: Positive for  nausea. Negative for vomiting, abdominal pain and diarrhea.  Genitourinary: Negative for dysuria and frequency.  Musculoskeletal: Negative for back pain.  Skin: Negative for rash.  Neurological: Positive for headaches. Negative for weakness and numbness.  Hematological: Negative for adenopathy.  Psychiatric/Behavioral: Negative for suicidal ideas, behavioral problems and self-injury. The patient is nervous/anxious.     Allergies  Cephalexin; Clarithromycin; and Penicillins  Home Medications   Current Outpatient Rx  Name  Route  Sig  Dispense  Refill  . aspirin 325 MG tablet   Oral   Take 325 mg by mouth daily.         . clonazePAM (KLONOPIN) 2 MG tablet   Oral   Take 1 mg by mouth 3 (three) times daily as needed.          Marland Kitchen FLUoxetine (PROZAC) 40 MG capsule   Oral   Take 40 mg by mouth daily.         Marland Kitchen gabapentin (NEURONTIN) 400 MG capsule   Oral   Take 400 mg by mouth 4 (four) times daily.         Marland Kitchen ibuprofen (ADVIL,MOTRIN) 200 MG tablet   Oral   Take 800 mg by mouth every 6 (six) hours as needed. For pain         . LORazepam (ATIVAN) 1 MG tablet   Oral   Take 1 tablet (  1 mg total) by mouth 3 (three) times daily as needed for anxiety.   15 tablet   0   . ondansetron (ZOFRAN ODT) 8 MG disintegrating tablet      8mg  ODT q4 hours prn nausea   4 tablet   0   . promethazine (PHENERGAN) 25 MG tablet   Oral   Take 1 tablet (25 mg total) by mouth every 6 (six) hours as needed for nausea.   30 tablet   0   . atenolol (TENORMIN) 25 MG tablet   Oral   Take 25 mg by mouth 2 (two) times daily.          . clonazePAM (KLONOPIN) 0.5 MG tablet   Oral   Take 2 tablets (1 mg total) by mouth 3 (three) times daily as needed for anxiety.   21 tablet   0   . meloxicam (MOBIC) 7.5 MG tablet   Oral   Take 1 tablet (7.5 mg total) by mouth daily.   30 tablet   0     BP 120/76  Pulse 79  Temp(Src) 98.5 F (36.9 C) (Oral)  Resp 30  Ht 5' 5.5" (1.664 m)   Wt 150 lb (68.04 kg)  BMI 24.57 kg/m2  SpO2 93%  LMP 12/10/2012  Physical Exam  Nursing note and vitals reviewed. Constitutional: She appears well-developed and well-nourished. No distress.  WD/WN female in no distress. Pulse for me was 70's.  HENT:  Head: Normocephalic and atraumatic.  Mouth/Throat: Oropharynx is clear and moist. No oropharyngeal exudate.  Eyes: Conjunctivae and EOM are normal. Pupils are equal, round, and reactive to light. Right eye exhibits no discharge. Left eye exhibits no discharge. No scleral icterus.  Neck: Normal range of motion. Neck supple. No JVD present. No thyromegaly present.  Cardiovascular: Normal rate, regular rhythm, normal heart sounds and intact distal pulses.  Exam reveals no gallop and no friction rub.   No murmur heard. Pulmonary/Chest: Effort normal and breath sounds normal. No respiratory distress. She has no wheezes. She has no rales.  Abdominal: Soft. Bowel sounds are normal. She exhibits no distension and no mass. There is no tenderness.  Musculoskeletal: Normal range of motion. She exhibits no edema and no tenderness.  Lymphadenopathy:    She has no cervical adenopathy.  Neurological: She is alert. Coordination normal.  GCS 15, A/O x 3 CN intact Motor 5/5 in 4 Sensation to light touch intact in 4 FTN nml Gait nml, no ataxia Patient has generalized shakiness  Skin: Skin is warm and dry. No rash noted. No erythema.  Psychiatric:  Mood: dysphoric Affect: anxious     ED Course  Procedures (including critical care time)  Labs Reviewed - No data to display No results found.   1. Anxiety       MDM  47 yo female with anxiety, long history of such, has triggers, requesting symptomatic relief. Headache is typical migraine, long history again of such, and no red flag signs/symptoms to suggest need for neuroimaging or lumbar puncture. Patient wishes for symptomatic relief in ED. She is requesting Dilaudid as that normally helps her  headache. Will give Hydromorphone, Ativan, and reassess patient.  Pt improved after meds - told she needs f/u with PMD / Lucita Ferrara / Psych for her       Vida Roller, MD 01/14/13 1432

## 2013-01-21 ENCOUNTER — Emergency Department (HOSPITAL_COMMUNITY): Payer: Medicaid Other

## 2013-01-21 ENCOUNTER — Emergency Department (HOSPITAL_COMMUNITY)
Admission: EM | Admit: 2013-01-21 | Discharge: 2013-01-21 | Disposition: A | Discharge status: Home / Self Care | Payer: Medicaid Other | Attending: Emergency Medicine | Admitting: Emergency Medicine

## 2013-01-21 ENCOUNTER — Encounter (HOSPITAL_COMMUNITY): Payer: Self-pay | Admitting: Emergency Medicine

## 2013-01-21 DIAGNOSIS — R112 Nausea with vomiting, unspecified: Secondary | ICD-10-CM | POA: Insufficient documentation

## 2013-01-21 DIAGNOSIS — F341 Dysthymic disorder: Secondary | ICD-10-CM | POA: Insufficient documentation

## 2013-01-21 DIAGNOSIS — R079 Chest pain, unspecified: Secondary | ICD-10-CM

## 2013-01-21 DIAGNOSIS — Z8719 Personal history of other diseases of the digestive system: Secondary | ICD-10-CM | POA: Insufficient documentation

## 2013-01-21 DIAGNOSIS — F419 Anxiety disorder, unspecified: Secondary | ICD-10-CM

## 2013-01-21 DIAGNOSIS — Z862 Personal history of diseases of the blood and blood-forming organs and certain disorders involving the immune mechanism: Secondary | ICD-10-CM | POA: Insufficient documentation

## 2013-01-21 DIAGNOSIS — R9431 Abnormal electrocardiogram [ECG] [EKG]: Secondary | ICD-10-CM

## 2013-01-21 DIAGNOSIS — R0789 Other chest pain: Secondary | ICD-10-CM | POA: Insufficient documentation

## 2013-01-21 DIAGNOSIS — Z8639 Personal history of other endocrine, nutritional and metabolic disease: Secondary | ICD-10-CM | POA: Insufficient documentation

## 2013-01-21 DIAGNOSIS — Z79899 Other long term (current) drug therapy: Secondary | ICD-10-CM | POA: Insufficient documentation

## 2013-01-21 DIAGNOSIS — F410 Panic disorder [episodic paroxysmal anxiety] without agoraphobia: Secondary | ICD-10-CM | POA: Insufficient documentation

## 2013-01-21 DIAGNOSIS — R51 Headache: Secondary | ICD-10-CM | POA: Insufficient documentation

## 2013-01-21 DIAGNOSIS — Z7982 Long term (current) use of aspirin: Secondary | ICD-10-CM | POA: Insufficient documentation

## 2013-01-21 DIAGNOSIS — F411 Generalized anxiety disorder: Secondary | ICD-10-CM | POA: Insufficient documentation

## 2013-01-21 DIAGNOSIS — F17200 Nicotine dependence, unspecified, uncomplicated: Secondary | ICD-10-CM | POA: Insufficient documentation

## 2013-01-21 DIAGNOSIS — R42 Dizziness and giddiness: Secondary | ICD-10-CM | POA: Insufficient documentation

## 2013-01-21 LAB — POCT I-STAT TROPONIN I
Troponin i, poc: 0 ng/mL (ref 0.00–0.08)
Troponin i, poc: 0.01 ng/mL (ref 0.00–0.08)

## 2013-01-21 LAB — CBC WITH DIFFERENTIAL/PLATELET
Basophils Absolute: 0 10*3/uL (ref 0.0–0.1)
Basophils Relative: 0 % (ref 0–1)
Hemoglobin: 13.6 g/dL (ref 12.0–15.0)
Lymphocytes Relative: 20 % (ref 12–46)
MCHC: 35.1 g/dL (ref 30.0–36.0)
Monocytes Relative: 4 % (ref 3–12)
Neutro Abs: 7.3 10*3/uL (ref 1.7–7.7)
Neutrophils Relative %: 75 % (ref 43–77)
RBC: 4.53 MIL/uL (ref 3.87–5.11)
WBC: 9.7 10*3/uL (ref 4.0–10.5)

## 2013-01-21 LAB — BASIC METABOLIC PANEL
BUN: 14 mg/dL (ref 6–23)
CO2: 25 mEq/L (ref 19–32)
Chloride: 105 mEq/L (ref 96–112)
GFR calc Af Amer: >90 mL/min (ref >90)
Potassium: 3.7 mEq/L (ref 3.5–5.1)

## 2013-01-21 MED ORDER — DIPHENHYDRAMINE HCL 50 MG/ML IJ SOLN
25.0000 mg | Freq: Once | INTRAMUSCULAR | Status: AC
  Administered 2013-01-21: 25 mg via INTRAVENOUS
  Filled 2013-01-21: qty 1

## 2013-01-21 MED ORDER — LORAZEPAM 2 MG/ML IJ SOLN
1.0000 mg | Freq: Once | INTRAMUSCULAR | Status: AC
  Administered 2013-01-21: 1 mg via INTRAVENOUS
  Filled 2013-01-21: qty 1

## 2013-01-21 MED ORDER — METOCLOPRAMIDE HCL 5 MG/ML IJ SOLN
10.0000 mg | Freq: Once | INTRAMUSCULAR | Status: AC
  Administered 2013-01-21: 10 mg via INTRAVENOUS
  Filled 2013-01-21: qty 2

## 2013-01-21 MED ORDER — HYDROMORPHONE HCL PF 1 MG/ML IJ SOLN
1.0000 mg | Freq: Once | INTRAMUSCULAR | Status: AC
  Administered 2013-01-21: 1 mg via INTRAVENOUS
  Filled 2013-01-21: qty 1

## 2013-01-21 MED ORDER — KETOROLAC TROMETHAMINE 30 MG/ML IJ SOLN
30.0000 mg | Freq: Once | INTRAMUSCULAR | Status: AC
  Administered 2013-01-21: 30 mg via INTRAVENOUS
  Filled 2013-01-21: qty 1

## 2013-01-21 MED ORDER — ONDANSETRON HCL 4 MG/2ML IJ SOLN
4.0000 mg | Freq: Once | INTRAMUSCULAR | Status: AC
  Administered 2013-01-21: 4 mg via INTRAVENOUS
  Filled 2013-01-21: qty 2

## 2013-01-21 NOTE — ED Provider Notes (Signed)
Medical screening examination/treatment/procedure(s) were performed by non-physician practitioner and as supervising physician I was immediately available for consultation/collaboration.  Hurman Horn, MD 01/21/13 2153

## 2013-01-21 NOTE — ED Notes (Signed)
Pt BIB EMS with c/o migraine that started this AM followed by CP. Has been seen for same recently. Has cardiologist. Pt reports hx of anxiety. 12 lead EKG by EMS unremarkable.

## 2013-01-21 NOTE — ED Notes (Signed)
PA informed of pt's leaving without getting discharge instructions.

## 2013-01-21 NOTE — ED Notes (Signed)
States having chest pain-- squeezing felling-- and migraine still present-- no pain on palpation, no change in pain with resp. No resp distress

## 2013-01-21 NOTE — ED Notes (Signed)
When discharge papers were brought to room-- pt was gone.
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
## 2013-01-21 NOTE — ED Provider Notes (Signed)
History     CSN: [id number]  Arrival date & time 01/21/13  7829   First MD Initiated Contact with Patient 01/21/13 [phone]      Chief Complaint  Patient presents with  . Chest Pain  . Migraine    (Consider location/radiation/quality/duration/timing/severity/associated sxs/prior treatment) HPI Cassidy Miles is a 47 y.o. female who presents to ED with complaint of migraine headache for last 3 days, and waking up this morning with chest pressure and panic attack. Pt states she has hx of migraines, followed by neurologist. States this headache is typical of her common migraines, with pain on the right side of the head, aura, and sensitivity to light and sound. States took imitrex and another migraine medication as well as klonopin yesterday with no relief. States nausea, vomiting x1 this morning. States pressure in the chest was there when she woke up, it has been constant since then. Denies SOB, however states she feels anxious, tearful. States hx of anxiety. States going through a lot of stress which she believes is what causing her migraines and panic attacks. States her house and car was reposessed, and she got divorce papers yesterday. Pt denies prior cardiac hx. Took 325mg  asa.   Past Medical History  Diagnosis Date  . GERD (gastroesophageal reflux disease)   . IBS (irritable bowel syndrome)   . Migraines   . Hypercholesteremia   . Anxiety and depression   . Panic attack     History reviewed. No pertinent past surgical history.  No family history on file.  History  Substance Use Topics  . Smoking status: Current Some Day Smoker    Types: Cigarettes  . Smokeless tobacco: Not on file  . Alcohol Use: Yes     Comment: occasional use, last drink about 1 month ago    OB History   Grav Para Term Preterm Abortions TAB SAB Ect Mult Living                  Review of Systems  Constitutional: Negative for fever and chills.  Respiratory: Positive for chest tightness. Negative for  cough and shortness of breath.   Cardiovascular: Positive for chest pain. Negative for palpitations and leg swelling.  Gastrointestinal: Positive for nausea and vomiting.  Genitourinary: Negative for flank pain.  Neurological: Positive for dizziness.  Psychiatric/Behavioral: The patient is nervous/anxious.     Allergies  Cephalexin; Clarithromycin; and Penicillins  Home Medications   Current Outpatient Rx  Name  Route  Sig  Dispense  Refill  . aspirin 325 MG tablet   Oral   Take 325 mg by mouth daily.         Marland Kitchen atenolol (TENORMIN) 25 MG tablet   Oral   Take 25 mg by mouth 2 (two) times daily.          . clonazePAM (KLONOPIN) 0.5 MG tablet   Oral   Take 2 tablets (1 mg total) by mouth 3 (three) times daily as needed for anxiety.   21 tablet   0   . clonazePAM (KLONOPIN) 2 MG tablet   Oral   Take 1 mg by mouth 3 (three) times daily as needed.          Marland Kitchen FLUoxetine (PROZAC) 40 MG capsule   Oral   Take 40 mg by mouth daily.         Marland Kitchen gabapentin (NEURONTIN) 400 MG capsule   Oral   Take 400 mg by mouth 4 (four) times daily.         Marland Kitchen  ibuprofen (ADVIL,MOTRIN) 200 MG tablet   Oral   Take 800 mg by mouth every 6 (six) hours as needed. For pain         . LORazepam (ATIVAN) 1 MG tablet   Oral   Take 1 tablet (1 mg total) by mouth 3 (three) times daily as needed for anxiety.   15 tablet   0   . meloxicam (MOBIC) 7.5 MG tablet   Oral   Take 1 tablet (7.5 mg total) by mouth daily.   30 tablet   0   . ondansetron (ZOFRAN ODT) 8 MG disintegrating tablet      8mg  ODT q4 hours prn nausea   4 tablet   0   . promethazine (PHENERGAN) 25 MG tablet   Oral   Take 1 tablet (25 mg total) by mouth every 6 (six) hours as needed for nausea.   30 tablet   0     BP 131/71  Pulse 83  Temp(Src) 98.8 F (37.1 C) (Oral)  SpO2 100%  LMP 01/10/2013  Physical Exam  Nursing note and vitals reviewed. Constitutional: She is oriented to person, place, and time. She  appears well-developed and well-nourished.  Tearful, anxious  Eyes: Conjunctivae are normal.  Cardiovascular: Normal rate, regular rhythm and normal heart sounds.   Pulmonary/Chest: Effort normal and breath sounds normal. No respiratory distress. She has no wheezes. She has no rales.  Abdominal: Soft. Bowel sounds are normal. She exhibits no distension. There is no tenderness. There is no rebound.  Musculoskeletal: She exhibits no edema.  Neurological: She is alert and oriented to person, place, and time.  Skin: Skin is warm and dry.    ED Course  Procedures (including critical care time)   Date: 01/21/2013  Rate: 76  Rhythm: normal sinus rhythm  QRS Axis: normal  Intervals: PR shortened  ST/T Wave abnormalities: normal  Conduction Disutrbances:none  Narrative Interpretation:   Old EKG Reviewed: unchanged  Pt with atypical chest pain, migraine, anxious, crying. Will get labs.   Results for orders placed during the hospital encounter of 01/21/13  CBC WITH DIFFERENTIAL      Result Value Range   WBC 9.7  4.0 - 10.5 K/uL   RBC 4.53  3.87 - 5.11 MIL/uL   Hemoglobin 13.6  12.0 - 15.0 g/dL   HCT 40.9  81.1 - 91.4 %   MCV 85.4  78.0 - 100.0 fL   MCH 30.0  26.0 - 34.0 pg   MCHC 35.1  30.0 - 36.0 g/dL   RDW 78.2  95.6 - 21.3 %   Platelets 343  150 - 400 K/uL   Neutrophils Relative 75  43 - 77 %   Neutro Abs 7.3  1.7 - 7.7 K/uL   Lymphocytes Relative 20  12 - 46 %   Lymphs Abs 1.9  0.7 - 4.0 K/uL   Monocytes Relative 4  3 - 12 %   Monocytes Absolute 0.4  0.1 - 1.0 K/uL   Eosinophils Relative 0  0 - 5 %   Eosinophils Absolute 0.0  0.0 - 0.7 K/uL   Basophils Relative 0  0 - 1 %   Basophils Absolute 0.0  0.0 - 0.1 K/uL  BASIC METABOLIC PANEL      Result Value Range   Sodium 141  135 - 145 mEq/L   Potassium 3.7  3.5 - 5.1 mEq/L   Chloride 105  96 - 112 mEq/L   CO2 25  19 - 32 mEq/L  Glucose, Bld 90  70 - 99 mg/dL   BUN 14  6 - 23 mg/dL   Creatinine, Ser 9.56  0.50 - 1.10  mg/dL   Calcium 9.4  8.4 - 21.3 mg/dL   GFR calc non Af Amer >90  >90 mL/min   GFR calc Af Amer >90  >90 mL/min  POCT I-STAT TROPONIN I      Result Value Range   Troponin i, poc 0.00  0.00 - 0.08 ng/mL   Comment 3           POCT I-STAT TROPONIN I      Result Value Range   Troponin i, poc 0.01  0.00 - 0.08 ng/mL   Comment 3            Dg Chest 2 View  01/21/2013  *RADIOLOGY REPORT*  Clinical Data:  Chest pain, severe migraine, slight shortness of breath, smoker  CHEST - 2 VIEW  Comparison: 01/09/2039  Findings: Upper normal heart size. Normal mediastinal contours and pulmonary vascularity. Lungs clear. No pleural effusion or pneumothorax. Irregular calcification within the proximal right humerus, 3.1 x 1.9 cm in size, question enchondroma versus old bone infarct, unchanged. No definite fractures identified.  IMPRESSION: No radiographic evidence of acute injury.   Original Report Authenticated By: Ulyses Southward, M.D.       1. Headache   2. Anxiety   3. Chest pain   4. Abnormal ECG       MDM  Pt's labs, ecg, CXR, troponin x2 negative. She was hydrated with IVF, she was given originally ativan, toradol for her headache, which did not help. She was then given reglan and benadryl IV, with no improvement as well. Pt then stated "dilaudid usually works well." given pt dilaudid IV, which improved her headache. Pt's CP resolved at this time, she is no longer anxious. She has no risk factors for CAD. VS normal, doubt PE. pt was told she was going to be going home, with outpatient follow up and referal to cardiology due to her short PR interval, which she had on prior ECG as well. Pt left without informing staff before papers were given to her.         Lottie Mussel, PA-C 01/21/13 1639

## 2013-01-21 NOTE — ED Notes (Signed)
Pt states "that cocktail won't help, I need Dilaudid"

## 2013-02-11 ENCOUNTER — Telehealth: Payer: Self-pay | Admitting: Neurology

## 2013-02-11 NOTE — Telephone Encounter (Addendum)
Pt called after hours, and Dr. Pearlean Brownie brought message to me.  I called pt who stated she has called several time yesterday for level 10 headache, knife pain, lightning flashes, vision loss, nausea.   These are typical migraine headache pains for her.  She has taken her meds and these have not helped.   Offered urgent care and she stated they use toradol and these have not helped in the past.  I tried to get Dr.Yan, not at desk this evening.  Will send message and may need to contact tomorrow.  She verbalized understanding.  Had seen C. Daphine Deutscher NP last ofv visit.  She asked about nasal medication.  I told her that had been stopped 12/13.

## 2013-02-12 ENCOUNTER — Other Ambulatory Visit: Payer: Self-pay

## 2013-02-12 MED ORDER — OXYCODONE-ACETAMINOPHEN 5-325 MG PO TABS: 1.0000 | ORAL_TABLET | Freq: Every day | ORAL | Status: DC | PRN

## 2013-02-12 MED ORDER — BUTORPHANOL TARTRATE 10 MG/ML NA SOLN: NASAL | Status: DC

## 2013-02-12 NOTE — Telephone Encounter (Signed)
Patient left message with clinic asking for a refill on Percocet.

## 2013-02-12 NOTE — Telephone Encounter (Signed)
Spoke with patient will call in Stadol. Rx printed.

## 2013-02-17 ENCOUNTER — Telehealth: Payer: Self-pay

## 2013-02-17 NOTE — Telephone Encounter (Signed)
Patient requesting a drug change from Percocet to Vicodin.  Says she cannot pick up rx here and would like it to be called into [facility].

## 2013-02-17 NOTE — Telephone Encounter (Signed)
Patient calling and states she still has a headache and asking for vico din or percocet to be called in.. I have tried to call patient back three times to get more information voice mail is full and you can't  Leave message.

## 2013-02-17 NOTE — Telephone Encounter (Signed)
I could not find her chart in centricity please verify

## 2013-02-18 ENCOUNTER — Telehealth: Payer: Self-pay | Admitting: Neurology

## 2013-02-18 NOTE — Telephone Encounter (Signed)
Patient calling she had a migraine since last week please call in something else for pain. Patient states she can't come to office for apt. She does not have transpiration.

## 2013-02-19 ENCOUNTER — Encounter: Payer: Self-pay | Admitting: Neurology

## 2013-02-19 ENCOUNTER — Ambulatory Visit (INDEPENDENT_AMBULATORY_CARE_PROVIDER_SITE_OTHER): Payer: Medicaid Other | Admitting: Neurology

## 2013-02-19 VITALS — BP 103/71 | HR 78 | Ht 65.0 in | Wt 161.0 lb

## 2013-02-19 DIAGNOSIS — E7800 Pure hypercholesterolemia, unspecified: Secondary | ICD-10-CM

## 2013-02-19 DIAGNOSIS — K589 Irritable bowel syndrome without diarrhea: Secondary | ICD-10-CM

## 2013-02-19 DIAGNOSIS — F419 Anxiety disorder, unspecified: Secondary | ICD-10-CM

## 2013-02-19 DIAGNOSIS — F410 Panic disorder [episodic paroxysmal anxiety] without agoraphobia: Secondary | ICD-10-CM

## 2013-02-19 DIAGNOSIS — G43909 Migraine, unspecified, not intractable, without status migrainosus: Secondary | ICD-10-CM

## 2013-02-19 DIAGNOSIS — F329 Major depressive disorder, single episode, unspecified: Secondary | ICD-10-CM | POA: Insufficient documentation

## 2013-02-19 DIAGNOSIS — F341 Dysthymic disorder: Secondary | ICD-10-CM

## 2013-02-19 DIAGNOSIS — K219 Gastro-esophageal reflux disease without esophagitis: Secondary | ICD-10-CM

## 2013-02-19 MED ORDER — LAMOTRIGINE 100 MG PO TABS: 100.0000 mg | ORAL_TABLET | Freq: Two times a day (BID) | ORAL | Status: DC

## 2013-02-19 MED ORDER — ZOLMITRIPTAN 5 MG PO TABS: 5.0000 mg | ORAL_TABLET | ORAL | Status: DC | PRN

## 2013-02-19 MED ORDER — LAMOTRIGINE 25 MG PO TBDP: 25.0000 mg | ORAL_TABLET | Freq: Two times a day (BID) | ORAL | Status: DC

## 2013-02-19 MED ORDER — KETOROLAC TROMETHAMINE 30 MG/ML IM SOLN: 30.0000 mg | Freq: Once | INTRAMUSCULAR | Status: DC

## 2013-02-19 MED ORDER — PROCHLORPERAZINE EDISYLATE 5 MG/ML IJ SOLN
10.0000 mg | Freq: Once | INTRAMUSCULAR | Status: AC
  Administered 2013-02-19: 10 mg via INTRAMUSCULAR

## 2013-02-19 MED ORDER — KETOROLAC TROMETHAMINE 30 MG/ML IM SOLN
30.0000 mg | Freq: Once | INTRAMUSCULAR | Status: AC
  Administered 2013-02-19: 30 mg via INTRAMUSCULAR

## 2013-02-19 NOTE — Telephone Encounter (Signed)
Patient cam e to office for apt today for migraine.

## 2013-02-19 NOTE — Patient Instructions (Signed)
Pt had driver to take her home.

## 2013-02-19 NOTE — Progress Notes (Signed)
HPI:   47 year old left-handed white separated female with a past history of depression, anxiety, and migraine headaches.  She is followed by a psychiatrist, Dr. Loralie Champagne [place] [facility] for the last 6 months and states her depression is currently under good control.She is been hospitalized at old [facility] in [place] 06/2012 and [facility] 08/2012 for depression.   She has a many year history of headaches thought to be migraine treated with Imitrex, Maxalt,Relpax,Imitrex injections, ODT Maxalt, cyclobenzaprine, nonsteroidal anti-inflammatory medicines,   For preventive medications, she has tried topiramate, Zonegran, Depakote, amitriptyline, nortriptyline, and steroids. She has not been on verapamil, propranolol, Botox, Ergomar, or Migranal.  Her headaches begin in the right parietal region and migrate to her head. She did awaken at night with headaches. She sleeps on 2 pillows. Her headaches are associated with nausea 100% of the time and vomiting 70% of the time.   She takes oxycodone-acetaminophen 5/325 10 per week for headaches without benefit. She last received 20 tablets approximately one month ago. She was treated with IV Depacon in our office twice over the past 3 months without relief, and  received a course of by mouth prednisone without relief. She received Compazine and IM DHE 1 milligram without relief.     She states that she has had CAT scans and MRIs  In the past, that was normal.  UPDATE April 4th 2014:  She complains of headache x one week, right retrobital pounding severe headaches, with light, noise sensitivity, she has tried ibuprofen, stadol nasal spray which only helped her temporarily. She had run out of her Percocet, which only helped her temporarily, I will give her intramuscular Toradol, and Compazine today, she is taking propanolol ER 80 mg every day, tolerating it well, no significant improvement in her headaches,  however started lamotrigine, titrating dose,   Review of Systems  Out of a complete 14 system review, the patient complains of only the following symptoms, and all other reviewed systems are negative.   Constitutional:   N/A Cardiovascular:  N/A Ear/Nose/Throat:  N/A Skin: N/A Eyes: Loss of vision Respiratory: N/A Gastroitestinal: N/A    Hematology/Lymphatic:  N/A Endocrine:  N/A Musculoskeletal:N/A Allergy/Immunology: N/A Neurological: Headaches, insomnia Psychiatric:    Depression  PHYSICAL EXAMINATOINS:  Generalized: sitting in dark room, depressed looking. Neck: Supple, no carotid bruits   Cardiac: Regular rate rhythm  Pulmonary: Clear to auscultation bilaterally  Musculoskeletal: No deformity  Neurological examination  Mentation: Alert oriented to time, place, history taking, and causual conversation  Cranial nerve II-XII: Pupils were equal round reactive to light extraocular movements were full, visual field were full on confrontational test. facial sensation and strength were normal. hearing was intact to finger rubbing bilaterally. Uvula tongue midline.  head turning and shoulder shrug and were normal and symmetric.Tongue protrusion into cheek strength was normal.  Motor: normal tone, bulk and strength.  Sensory: Intact to fine touch, pinprick, preserved vibratory sensation, and proprioception at toes.  Coordination: Normal finger to nose, heel-to-shin bilaterally there was no truncal ataxia  Gait: Rising up from seated position without assistance, normal stance, without trunk ataxia, moderate stride, good arm swing, smooth turning, able to perform tiptoe, and heel walking without difficulty.   Romberg signs: Negative  Deep tendon reflexes: Brachioradialis 2/2, biceps 2/2, triceps 2/2, patellar 2/2, Achilles 2/2, plantar responses were flexor bilaterally.  Assessment and plan:  47 years old Caucasian female, with past medical history of depression, anxiety,  presenting with frequent migraine headaches,  failed multiple preventive medication, and abortive treatment in the past  1. Will add on lamotrigine gradually titrating dose, as preventive medications new prescription was provided 2. Zomig as nee for abortive treatment, 3. Narcortic medication is not a good choice for chronic daily headaches 4. RTC with Eber Jones in 4 months

## 2013-02-19 NOTE — Addendum Note (Signed)
Addended by: Guy Begin on: 02/19/2013 10:43 AM   Modules accepted: Orders, Medications

## 2013-02-19 NOTE — Telephone Encounter (Signed)
Patient came in for apt. Today to see Dr.yan for her migraines.

## 2013-02-26 ENCOUNTER — Emergency Department (HOSPITAL_COMMUNITY)
Admission: EM | Admit: 2013-02-26 | Discharge: 2013-02-26 | Disposition: A | Discharge status: Home / Self Care | Payer: Medicaid Other | Attending: Emergency Medicine | Admitting: Emergency Medicine

## 2013-02-26 ENCOUNTER — Encounter (HOSPITAL_COMMUNITY): Payer: Self-pay | Admitting: Cardiology

## 2013-02-26 DIAGNOSIS — Z8719 Personal history of other diseases of the digestive system: Secondary | ICD-10-CM | POA: Insufficient documentation

## 2013-02-26 DIAGNOSIS — R112 Nausea with vomiting, unspecified: Secondary | ICD-10-CM | POA: Insufficient documentation

## 2013-02-26 DIAGNOSIS — R197 Diarrhea, unspecified: Secondary | ICD-10-CM | POA: Insufficient documentation

## 2013-02-26 DIAGNOSIS — F17200 Nicotine dependence, unspecified, uncomplicated: Secondary | ICD-10-CM | POA: Insufficient documentation

## 2013-02-26 DIAGNOSIS — F419 Anxiety disorder, unspecified: Secondary | ICD-10-CM

## 2013-02-26 DIAGNOSIS — G43909 Migraine, unspecified, not intractable, without status migrainosus: Secondary | ICD-10-CM | POA: Insufficient documentation

## 2013-02-26 DIAGNOSIS — F329 Major depressive disorder, single episode, unspecified: Secondary | ICD-10-CM | POA: Insufficient documentation

## 2013-02-26 DIAGNOSIS — F411 Generalized anxiety disorder: Secondary | ICD-10-CM | POA: Insufficient documentation

## 2013-02-26 DIAGNOSIS — Z8659 Personal history of other mental and behavioral disorders: Secondary | ICD-10-CM | POA: Insufficient documentation

## 2013-02-26 DIAGNOSIS — F3289 Other specified depressive episodes: Secondary | ICD-10-CM | POA: Insufficient documentation

## 2013-02-26 DIAGNOSIS — E7800 Pure hypercholesterolemia, unspecified: Secondary | ICD-10-CM | POA: Insufficient documentation

## 2013-02-26 MED ORDER — METOCLOPRAMIDE HCL 5 MG/ML IJ SOLN
10.0000 mg | Freq: Once | INTRAMUSCULAR | Status: AC
  Administered 2013-02-26: 10 mg via INTRAVENOUS
  Filled 2013-02-26: qty 2

## 2013-02-26 MED ORDER — ONDANSETRON HCL 4 MG/2ML IJ SOLN
4.0000 mg | Freq: Once | INTRAMUSCULAR | Status: AC
  Administered 2013-02-26: 4 mg via INTRAVENOUS
  Filled 2013-02-26: qty 2

## 2013-02-26 MED ORDER — SODIUM CHLORIDE 0.9 % IV BOLUS (SEPSIS)
1000.0000 mL | Freq: Once | INTRAVENOUS | Status: AC
  Administered 2013-02-26: 1000 mL via INTRAVENOUS

## 2013-02-26 MED ORDER — DIPHENHYDRAMINE HCL 50 MG/ML IJ SOLN
25.0000 mg | Freq: Once | INTRAMUSCULAR | Status: AC
  Administered 2013-02-26: 25 mg via INTRAVENOUS
  Filled 2013-02-26: qty 1

## 2013-02-26 MED ORDER — DEXAMETHASONE SODIUM PHOSPHATE 10 MG/ML IJ SOLN
10.0000 mg | Freq: Once | INTRAMUSCULAR | Status: AC
  Administered 2013-02-26: 10 mg via INTRAMUSCULAR
  Filled 2013-02-26: qty 1

## 2013-02-26 MED ORDER — LORAZEPAM 1 MG PO TABS
1.0000 mg | ORAL_TABLET | Freq: Once | ORAL | Status: AC
Start: 2013-02-26 — End: 2013-02-26
  Administered 2013-02-26: 1 mg via ORAL
  Filled 2013-02-26: qty 1

## 2013-02-26 MED ORDER — KETOROLAC TROMETHAMINE 30 MG/ML IJ SOLN
30.0000 mg | Freq: Once | INTRAMUSCULAR | Status: AC
  Administered 2013-02-26: 30 mg via INTRAVENOUS
  Filled 2013-02-26: qty 1

## 2013-02-26 MED ORDER — ONDANSETRON HCL 4 MG PO TABS: 4.0000 mg | ORAL_TABLET | Freq: Three times a day (TID) | ORAL | Status: DC | PRN

## 2013-02-26 MED ORDER — MORPHINE SULFATE 4 MG/ML IJ SOLN
4.0000 mg | Freq: Once | INTRAMUSCULAR | Status: AC
  Administered 2013-02-26: 4 mg via INTRAVENOUS
  Filled 2013-02-26: qty 1

## 2013-02-26 MED ORDER — LORAZEPAM 1 MG PO TABS
1.0000 mg | ORAL_TABLET | Freq: Once | ORAL | Status: AC
  Administered 2013-02-26: 1 mg via ORAL
  Filled 2013-02-26: qty 1

## 2013-02-26 NOTE — ED Notes (Signed)
Primary RN unsuccessful with IV start. 2nd RN attempting access.

## 2013-02-26 NOTE — ED Notes (Signed)
Pt approached RN and asked RN for ativan because she is anxious and shaking. RN told pt he would inform provider of request.

## 2013-02-26 NOTE — ED Provider Notes (Signed)
11:11 PM BP 129/84  Pulse 94  Temp(Src) 97.9 F (36.6 C) (Oral)  Resp 16  SpO2 98% Patient pick up from shift change from PA [place].  Patient given Migraine cocktail with miniimal effect.  Redosed and improved. Pt HA treated and improved while in ED.  Presentation is like pts typical HA and non concerning for [facility], ICH, Meningitis, or temporal arteritis. Pt is afebrile with no focal neuro deficits, nuchal rigidity, or change in vision. Pt is to follow up with PCP to discuss prophylactic medication. Pt verbalizes understanding and is agreeable with plan to dc.    Arthor Captain, PA-C 02/26/13 2314

## 2013-02-26 NOTE — ED Notes (Signed)
2nd RN unsuccessful with IV start. IV team paged.

## 2013-02-26 NOTE — ED Notes (Signed)
Pt reports 3 day hx of migraine HA and nausea/ diarrhea at home. EMS reports anxiety on arrival. Reports light sensitivity and sounds. No vision changes. No neuro deficits noted. Bp-121/79 Hr-100

## 2013-02-26 NOTE — ED Notes (Signed)
Pt no longer nauseous.

## 2013-02-26 NOTE — ED Notes (Signed)
Pt has ride home.

## 2013-02-26 NOTE — ED Notes (Signed)
Pt continues to be nauseated at this time.

## 2013-02-26 NOTE — ED Notes (Signed)
IV team at bedside 

## 2013-02-26 NOTE — ED Provider Notes (Signed)
History     CSN: [id number]  Arrival date & time 02/26/13  0160   First MD Initiated Contact with Patient 02/26/13 1906      Chief Complaint  Patient presents with  . Headache  . Anxiety    (Consider location/radiation/quality/duration/timing/severity/associated sxs/prior treatment) HPI  47 year old female with history of chronic headache presents complaining of worsening headache. Patient reports for the past 3 days she has had persistent migraine headache. Described as a sharp and throbbing sensation to her forehead with associated light and sound sensitivity. She also endorsed nausea, vomiting, and diarrhea. She has tried taking her medication without any relief. She endorsed chills and anxiety which is typical for headache. She denies fever, vision changes, neck stiffness, chest pain, shortness of breath, back pain, abdominal pain, dysuria, or rash. She has followup with her neurologist on the fourth and was given several different types of medication which has tried but it has provided no relief. Patient is seeking for treatment of a headache. She reports her headache usually improves after receiving Dilaudid and antinausea medication in the past.  Past Medical History  Diagnosis Date  . GERD (gastroesophageal reflux disease)   . IBS (irritable bowel syndrome)   . Migraines   . Hypercholesteremia   . Anxiety and depression   . Panic attack     History reviewed. No pertinent past surgical history.  Family History  Problem Relation Age of Onset  . Alzheimer's disease Mother     History  Substance Use Topics  . Smoking status: Current Some Day Smoker    Types: Cigarettes  . Smokeless tobacco: Not on file  . Alcohol Use: 0.0 oz/week     Comment: occasional use, last drink about 1 month ago    OB History   Grav Para Term Preterm Abortions TAB SAB Ect Mult Living                  Review of Systems  Constitutional:       10 Systems reviewed and all are negative for  acute change except as noted in the HPI.     Allergies  Cephalexin; Clarithromycin; and Penicillins  Home Medications   Current Outpatient Rx  Name  Route  Sig  Dispense  Refill  . butorphanol (STADOL) 10 MG/ML nasal spray      1 spray each nostril every 6 hrs, no more than twice daily   2.5 mL   0   . FLUoxetine (PROZAC) 40 MG capsule   Oral   Take 40 mg by mouth daily.         Marland Kitchen ibuprofen (ADVIL,MOTRIN) 200 MG tablet   Oral   Take 800 mg by mouth every 6 (six) hours as needed. For pain         . lamoTRIgine (LAMICTAL) 100 MG tablet   Oral   Take 1 tablet (100 mg total) by mouth 2 (two) times daily.   60 tablet   12   . lamotrigine (LAMICTAL) 25 MG disintegrating tablet   Oral   Take 1 tablet (25 mg total) by mouth 2 (two) times daily. One po bid xone week, then 2 tabs bid xone week, then 3 tabs bid. Then new Rx 100mg  bid.   200 tablet   0   . ondansetron (ZOFRAN-ODT) 8 MG disintegrating tablet   Oral   Take 8 mg by mouth every 6 (six) hours as needed for nausea.         Marland Kitchen oxyCODONE-acetaminophen (PERCOCET/ROXICET)  5-325 MG per tablet   Oral   Take 1 tablet by mouth daily as needed (headache). P/U   20 tablet   0   . propranolol ER (INDERAL LA) 80 MG 24 hr capsule   Oral   Take 80 mg by mouth daily.         Marland Kitchen zolmitriptan (ZOMIG) 5 MG tablet   Oral   Take 1 tablet (5 mg total) by mouth as needed for migraine. May repeat one in 2 hours, maximum 2 tabs in 24 hours.   10 tablet   0     BP 133/98  Pulse 112  Temp(Src) 97.4 F (36.3 C) (Oral)  Resp 16  SpO2 99%  Physical Exam  Nursing note and vitals reviewed. Constitutional: She is oriented to person, place, and time. She appears well-developed and well-nourished. No distress.  Awake, alert, nontoxic appearance  HENT:  Head: Atraumatic.  Mouth/Throat: Oropharynx is clear and moist.  Eyes: Conjunctivae and EOM are normal. Pupils are equal, round, and reactive to light. Right eye exhibits no  discharge. Left eye exhibits no discharge.  Neck: Neck supple.  No nuchal rigidity, no meningismal sign.  Cardiovascular: Normal rate and regular rhythm.   Pulmonary/Chest: Effort normal. No respiratory distress. She exhibits no tenderness.  Abdominal: Soft. There is no tenderness. There is no rebound.  Musculoskeletal: She exhibits no tenderness.  ROM appears intact, no obvious focal weakness  Lymphadenopathy:    She has no cervical adenopathy.  Neurological: She is alert and oriented to person, place, and time. She has normal strength. No cranial nerve deficit or sensory deficit. She displays a negative Romberg sign. Coordination and gait normal. GCS eye subscore is 4. GCS verbal subscore is 5. GCS motor subscore is 6.  Mental status and motor strength appears intact  Skin: No rash noted.  Psychiatric: She has a normal mood and affect.    ED Course  Procedures (including critical care time)  7:20 PM  patient presents complaining of headache along with nausea, vomiting, and diarrhea. She has a nonsurgical abdomen. She has a significant history of migraine headache with and multiple different types of headache treatment. She has been evaluated by her neurologist and to be on the past. Per Dr. Terrace Arabia (neurologist) note patient should not be given narcotic pain medication for her chronic headache. I explained this with the patient and will offer her a migraine cocktail. Patient agrees. Medication given.  8:07 PM Care discussed with oncoming PA, who will continue further care.    Labs Reviewed - No data to display No results found.   1. Migraines   2. Anxiety       MDM          Fayrene Helper, PA-C 02/26/13 2008

## 2013-02-27 NOTE — ED Provider Notes (Signed)
Medical screening examination/treatment/procedure(s) were performed by non-physician practitioner and as supervising physician I was immediately available for consultation/collaboration.   Shelda Jakes, MD 02/27/13 2004

## 2013-02-27 NOTE — ED Provider Notes (Signed)
Medical screening examination/treatment/procedure(s) were performed by non-physician practitioner and as supervising physician I was immediately available for consultation/collaboration.   Charles B. Sheldon, MD 02/27/13 2239 

## 2013-03-06 ENCOUNTER — Other Ambulatory Visit: Payer: Self-pay | Admitting: Physician Assistant

## 2013-03-08 ENCOUNTER — Encounter: Payer: Self-pay | Admitting: Family Medicine

## 2013-03-08 NOTE — Telephone Encounter (Signed)
Last refill per pharmacy 12/12/12.  Clonazepam 0.5mg   BID prn  #60 Need approval for controlled medication.

## 2013-03-08 NOTE — Telephone Encounter (Signed)
Due for OV. Last OV was 09/07/12

## 2013-03-08 NOTE — Telephone Encounter (Signed)
Pt called follow up appt made for 4/28

## 2013-03-09 ENCOUNTER — Other Ambulatory Visit: Payer: Self-pay

## 2013-03-09 MED ORDER — LAMOTRIGINE 25 MG PO TABS: ORAL_TABLET | ORAL | Status: DC

## 2013-03-12 ENCOUNTER — Encounter: Payer: Self-pay | Admitting: Family Medicine

## 2013-03-12 DIAGNOSIS — F4001 Agoraphobia with panic disorder: Secondary | ICD-10-CM | POA: Insufficient documentation

## 2013-03-12 DIAGNOSIS — F609 Personality disorder, unspecified: Secondary | ICD-10-CM | POA: Insufficient documentation

## 2013-03-15 ENCOUNTER — Ambulatory Visit: Payer: Self-pay | Admitting: Physician Assistant

## 2013-03-19 ENCOUNTER — Ambulatory Visit (INDEPENDENT_AMBULATORY_CARE_PROVIDER_SITE_OTHER): Payer: Self-pay | Admitting: Physician Assistant

## 2013-03-19 ENCOUNTER — Encounter: Payer: Self-pay | Admitting: Physician Assistant

## 2013-03-19 VITALS — BP 96/60 | HR 88 | Temp 97.8°F | Resp 18 | Ht 64.25 in | Wt 158.0 lb

## 2013-03-19 DIAGNOSIS — F609 Personality disorder, unspecified: Secondary | ICD-10-CM

## 2013-03-19 DIAGNOSIS — F329 Major depressive disorder, single episode, unspecified: Secondary | ICD-10-CM

## 2013-03-19 DIAGNOSIS — F341 Dysthymic disorder: Secondary | ICD-10-CM

## 2013-03-19 DIAGNOSIS — F410 Panic disorder [episodic paroxysmal anxiety] without agoraphobia: Secondary | ICD-10-CM

## 2013-03-19 DIAGNOSIS — G43909 Migraine, unspecified, not intractable, without status migrainosus: Secondary | ICD-10-CM

## 2013-03-19 MED ORDER — CLONAZEPAM 0.5 MG PO TABS: 0.5000 mg | ORAL_TABLET | Freq: Every day | ORAL | Status: DC | PRN

## 2013-03-19 NOTE — Progress Notes (Signed)
Patient ID: Cassidy Miles MRN: [id number], DOB: 1966-06-17, 47 y.o. Date of Encounter: 03/19/2013, 2:43 PM    Chief Complaint:  Chief Complaint  Patient presents with  . routine follow up  . Headache    has had migraine for past three days     HPI: 47 y.o. year old female here for f/u.   Needs refill on clonazepam to have available to use as needed. Says last bottle I gave her lasted a long time.   Says "Psych sees her for Prazac and that's all they follow her for." Goes to [facility]. LOV there was around December.   Went to [facility] for multiple OVs for her migraines. They added Lamictal. They changed Atenolol to Propranolol. Is on Neurontin. She has noticed no differenc in h/as. Also gave Zomig but this does not help. They "told her there was nothing else they could do". "They offered to refer to H/A center but she had already gone there in past-did trigger point injections, etc-no relief."  House was foreclosed. Car was reposessed. She has no car so cannot get a job b/c cannot get there. Living in a hotel.    Home Meds: Current Outpatient Prescriptions on File Prior to Visit  Medication Sig Dispense Refill  . FLUoxetine (PROZAC) 40 MG capsule Take 40 mg by mouth daily.      Marland Kitchen ibuprofen (ADVIL,MOTRIN) 200 MG tablet Take 800 mg by mouth every 6 (six) hours as needed. For pain      . lamoTRIgine (LAMICTAL) 100 MG tablet Take 1 tablet (100 mg total) by mouth 2 (two) times daily.  60 tablet  12  . lamoTRIgine (LAMICTAL) 25 MG tablet One po bid xone week, then 2 tabs bid xone week, then 3 tabs bid x one week . Then new Rx 100mg  bid.  200 tablet  0  . ondansetron (ZOFRAN) 4 MG tablet Take 1 tablet (4 mg total) by mouth every 8 (eight) hours as needed for nausea.  10 tablet  0  . propranolol ER (INDERAL LA) 80 MG 24 hr capsule Take 80 mg by mouth daily.      . butorphanol (STADOL) 10 MG/ML nasal spray 1 spray each nostril every 6 hrs, no more than twice daily  2.5 mL  0  .  zolmitriptan (ZOMIG) 5 MG tablet Take 1 tablet (5 mg total) by mouth as needed for migraine. May repeat one in 2 hours, maximum 2 tabs in 24 hours.  10 tablet  0   No current facility-administered medications on file prior to visit.    Allergies:  Allergies  Allergen Reactions  . Cephalexin Hives  . Clarithromycin Hives  . Penicillins Hives  . Tramadol Nausea Only      Review of Systems: See HPI. Other ROS negative. No suicidal/homicidal ideation.   Physical Exam: Blood pressure 96/60, pulse 88, temperature 97.8 F (36.6 C), temperature source Oral, resp. rate 18, height 5' 4.25" (1.632 m), weight 158 lb (71.668 kg), last menstrual period 03/12/2013., Body mass index is 26.91 kg/(m^2). General: Well developed, well nourished,WF in no acute distress. Neck: Supple. No thyromegaly. Full ROM. No lymphadenopathy. Lungs: Clear bilaterally to auscultation without wheezes, rales, or rhonchi. Breathing is unlabored. Heart: Regular rhythm. No murmurs, rubs, or gallops. Msk:  Strength and tone normal for age. Extremities/Skin: Warm and dry. No clubbing or cyanosis. No edema. No rashes or suspicious lesions. Neuro: Alert and oriented X 3. Moves all extremities spontaneously. Gait is normal. CNII-XII grossly in tact.  Psych:  Responds to questions appropriately with a normal affect.Very calm, appropriate.     ASSESSMENT AND PLAN:  47 y.o. year old female with  1. Migraines Cont current treatment, per [facility]  2. Anxiety and depression - clonazePAM (KLONOPIN) 0.5 MG tablet; Take 1 tablet (0.5 mg total) by mouth daily as needed for anxiety.  Dispense: 30 tablet; Refill: 0  3. Panic attack - clonazePAM (KLONOPIN) 0.5 MG tablet; Take 1 tablet (0.5 mg total) by mouth daily as needed for anxiety.  Dispense: 30 tablet; Refill: 0  4. Personality disorder   Signed, Shon Hale [place], [place], [facility] 03/19/2013 2:43 PM

## 2013-03-28 ENCOUNTER — Encounter (HOSPITAL_BASED_OUTPATIENT_CLINIC_OR_DEPARTMENT_OTHER): Payer: Self-pay

## 2013-03-28 ENCOUNTER — Emergency Department (HOSPITAL_BASED_OUTPATIENT_CLINIC_OR_DEPARTMENT_OTHER)
Admission: EM | Admit: 2013-03-28 | Discharge: 2013-03-28 | Disposition: A | Discharge status: Home / Self Care | Payer: Medicaid Other | Attending: Emergency Medicine | Admitting: Emergency Medicine

## 2013-03-28 DIAGNOSIS — R110 Nausea: Secondary | ICD-10-CM | POA: Insufficient documentation

## 2013-03-28 DIAGNOSIS — F341 Dysthymic disorder: Secondary | ICD-10-CM | POA: Insufficient documentation

## 2013-03-28 DIAGNOSIS — Z862 Personal history of diseases of the blood and blood-forming organs and certain disorders involving the immune mechanism: Secondary | ICD-10-CM | POA: Insufficient documentation

## 2013-03-28 DIAGNOSIS — F17200 Nicotine dependence, unspecified, uncomplicated: Secondary | ICD-10-CM | POA: Insufficient documentation

## 2013-03-28 DIAGNOSIS — Z8639 Personal history of other endocrine, nutritional and metabolic disease: Secondary | ICD-10-CM | POA: Insufficient documentation

## 2013-03-28 DIAGNOSIS — Z79899 Other long term (current) drug therapy: Secondary | ICD-10-CM | POA: Insufficient documentation

## 2013-03-28 DIAGNOSIS — G43909 Migraine, unspecified, not intractable, without status migrainosus: Secondary | ICD-10-CM | POA: Insufficient documentation

## 2013-03-28 DIAGNOSIS — Z8719 Personal history of other diseases of the digestive system: Secondary | ICD-10-CM | POA: Insufficient documentation

## 2013-03-28 DIAGNOSIS — Z880 Allergy status to penicillin: Secondary | ICD-10-CM | POA: Insufficient documentation

## 2013-03-28 MED ORDER — LORAZEPAM 2 MG/ML IJ SOLN
1.0000 mg | Freq: Once | INTRAMUSCULAR | Status: AC
  Administered 2013-03-28: 1 mg via INTRAVENOUS
  Filled 2013-03-28: qty 1

## 2013-03-28 MED ORDER — METOCLOPRAMIDE HCL 5 MG/ML IJ SOLN
10.0000 mg | Freq: Once | INTRAMUSCULAR | Status: AC
  Administered 2013-03-28: 10 mg via INTRAVENOUS
  Filled 2013-03-28: qty 2

## 2013-03-28 MED ORDER — DEXAMETHASONE SODIUM PHOSPHATE 10 MG/ML IJ SOLN
10.0000 mg | Freq: Once | INTRAMUSCULAR | Status: AC
  Administered 2013-03-28: 10 mg via INTRAVENOUS
  Filled 2013-03-28: qty 1

## 2013-03-28 MED ORDER — DIPHENHYDRAMINE HCL 50 MG/ML IJ SOLN
25.0000 mg | Freq: Once | INTRAMUSCULAR | Status: AC
  Administered 2013-03-28: 25 mg via INTRAVENOUS
  Filled 2013-03-28: qty 1

## 2013-03-28 MED ORDER — SODIUM CHLORIDE 0.9 % IV BOLUS (SEPSIS)
1000.0000 mL | Freq: Once | INTRAVENOUS | Status: AC
  Administered 2013-03-28: 1000 mL via INTRAVENOUS

## 2013-03-28 MED ORDER — KETOROLAC TROMETHAMINE 30 MG/ML IJ SOLN
30.0000 mg | Freq: Once | INTRAMUSCULAR | Status: AC
  Administered 2013-03-28: 30 mg via INTRAVENOUS
  Filled 2013-03-28: qty 1

## 2013-03-28 MED ORDER — MORPHINE SULFATE 4 MG/ML IJ SOLN
4.0000 mg | Freq: Once | INTRAMUSCULAR | Status: AC
  Administered 2013-03-28: 4 mg via INTRAVENOUS
  Filled 2013-03-28: qty 1

## 2013-03-28 NOTE — ED Notes (Signed)
Pt states that she has had migraine x4 days, went to clinic and was given shot of toradol with no relief.  Pt states that she has photo/phonophobia and n/v.  No other symptoms.  Presents to triage in sunglasses.

## 2013-03-28 NOTE — ED Provider Notes (Signed)
History     CSN: [id number]  Arrival date & time 03/28/13  0944   First MD Initiated Contact with Patient 03/28/13 1129      Chief Complaint  Patient presents with  . Migraine    (Consider location/radiation/quality/duration/timing/severity/associated sxs/prior treatment) HPI Pt presents with c/o migraine x 4 days. She has a hx of migraines and this feels similar to her prior migraines.  Pain is right sided and then spreads to be generalized headache.  No fever, no neck pain.  No vomiting, but has been nauseated. Pt tried zomig at home without much relief.  No changes in vision, but states she did have an aura when headache began.  Pain is constant and throbbing.  There are no other associated systemic symptoms, there are no other alleviating or modifying factors.   Past Medical History  Diagnosis Date  . GERD (gastroesophageal reflux disease)   . IBS (irritable bowel syndrome)   . Migraines   . Hypercholesteremia   . Anxiety and depression   . Panic attack     History reviewed. No pertinent past surgical history.  Family History  Problem Relation Age of Onset  . Alzheimer's disease Mother     History  Substance Use Topics  . Smoking status: Current Some Day Smoker    Types: Cigarettes  . Smokeless tobacco: Not on file  . Alcohol Use: 0.0 oz/week     Comment: occasional use, last drink about 1 month ago    OB History   Grav Para Term Preterm Abortions TAB SAB Ect Mult Living                  Review of Systems ROS reviewed and all otherwise negative except for mentioned in HPI  Allergies  Cephalexin; Clarithromycin; Penicillins; and Tramadol  Home Medications   Current Outpatient Rx  Name  Route  Sig  Dispense  Refill  . clonazePAM (KLONOPIN) 0.5 MG tablet   Oral   Take 1 tablet (0.5 mg total) by mouth daily as needed for anxiety.   30 tablet   0   . FLUoxetine (PROZAC) 40 MG capsule   Oral   Take 40 mg by mouth daily.         Marland Kitchen gabapentin  (NEURONTIN) 100 MG capsule   Oral   Take 100 mg by mouth 4 (four) times daily.         Marland Kitchen ibuprofen (ADVIL,MOTRIN) 200 MG tablet   Oral   Take 800 mg by mouth every 6 (six) hours as needed. For pain         . lamoTRIgine (LAMICTAL) 100 MG tablet   Oral   Take 1 tablet (100 mg total) by mouth 2 (two) times daily.   60 tablet   12   . lamoTRIgine (LAMICTAL) 25 MG tablet      One po bid xone week, then 2 tabs bid xone week, then 3 tabs bid x one week . Then new Rx 100mg  bid.   200 tablet   0   . ondansetron (ZOFRAN) 4 MG tablet   Oral   Take 1 tablet (4 mg total) by mouth every 8 (eight) hours as needed for nausea.   10 tablet   0   . propranolol ER (INDERAL LA) 80 MG 24 hr capsule   Oral   Take 80 mg by mouth daily.         . butorphanol (STADOL) 10 MG/ML nasal spray      1  spray each nostril every 6 hrs, no more than twice daily   2.5 mL   0   . zolmitriptan (ZOMIG) 5 MG tablet   Oral   Take 1 tablet (5 mg total) by mouth as needed for migraine. May repeat one in 2 hours, maximum 2 tabs in 24 hours.   10 tablet   0     BP 112/68  Pulse 100  Temp(Src) 98.2 F (36.8 C) (Oral)  Resp 18  Ht 5\' 5"  (1.651 m)  Wt 150 lb (68.04 kg)  BMI 24.96 kg/m2  SpO2 98%  LMP 03/12/2013 Vitals reviewed Physical Exam Physical Examination: General appearance - alert, uncomfortable appearing, and in no distress Mental status - alert, oriented to person, place, and time Eyes - pupils equal and reactive, extraocular eye movements intact Mouth - mucous membranes moist, pharynx normal without lesions Neck - supple, no significant adenopathy Chest - clear to auscultation, no wheezes, rales or rhonchi, symmetric air entry Heart - normal rate, regular rhythm, normal S1, S2, no murmurs, rubs, clicks or gallops Abdomen - soft, nontender, nondistended, no masses or organomegaly Neurological - alert, oriented x 3, cranial nerves 2-12 tested and intact, strength 5/5 in extremities x  4, sensation intact Extremities - peripheral pulses normal, no pedal edema, no clubbing or cyanosis Skin - normal coloration and turgor, no rashes  ED Course  Procedures (including critical care time)  1:11 PM pts heart rate has improved, however she states no relief from meds/migraine cocktail.  Further meds ordered.   Labs Reviewed - No data to display No results found.   1. Migraine       MDM  Pt with hx of migraines presenting with headache similar to her prior migraines.  No signs or sx of SAH or other emergent cause of headache.  Pt tachycardic upon arrival.  Treated with IV meds, IV hydration, meds re-dosed after no relief- then she felt some improvement and was requesting discharge.  Discharged with strict return precautions.  Pt agreeable with plan.        Ethelda Chick, MD 03/28/13 [phone]

## 2013-03-28 NOTE — ED Notes (Signed)
States that she is feeling a little better and wants to be discharged.  Dr. Karma Ganja notified.

## 2013-04-07 ENCOUNTER — Telehealth: Payer: Self-pay | Admitting: Physician Assistant

## 2013-04-07 MED ORDER — NAPROXEN 500 MG PO TABS: 500.0000 mg | ORAL_TABLET | Freq: Two times a day (BID) | ORAL | Status: DC

## 2013-04-07 MED ORDER — CETIRIZINE HCL 10 MG PO TABS: 10.0000 mg | ORAL_TABLET | Freq: Every day | ORAL | Status: DC

## 2013-04-07 MED ORDER — RANITIDINE HCL 150 MG PO TABS: 150.0000 mg | ORAL_TABLET | Freq: Every day | ORAL | Status: DC

## 2013-04-07 NOTE — Telephone Encounter (Signed)
Zantac 150 mg one po QD prn # 30/ 11 Zyrtec 10 mg one po QD prn # 30/ 5 Naprosyn 500 mg one po BID with food # 60 / 1 --Tell her not to take this for long time-will irritate stomach.

## 2013-04-07 NOTE — Telephone Encounter (Signed)
Meds ordered

## 2013-04-09 ENCOUNTER — Telehealth: Payer: Self-pay | Admitting: Physician Assistant

## 2013-04-09 MED ORDER — IBUPROFEN 200 MG PO TABS: 800.0000 mg | ORAL_TABLET | Freq: Four times a day (QID) | ORAL | Status: DC | PRN

## 2013-04-09 NOTE — Telephone Encounter (Signed)
Med called out to pharmacy,pt aware

## 2013-04-09 NOTE — Telephone Encounter (Signed)
Approved. Medicaid cover naprosyn. Naprosyn 500 mg one po bid with food-prn pain. # 60 / 5 refills

## 2013-04-09 NOTE — Telephone Encounter (Signed)
Pt called and wanted to know if you can prescribe her some ibuprofen 800mg ?

## 2013-04-19 ENCOUNTER — Other Ambulatory Visit: Payer: Self-pay | Admitting: Physician Assistant

## 2013-04-20 NOTE — Telephone Encounter (Signed)
Ok. Approved/Agree.

## 2013-04-20 NOTE — Telephone Encounter (Signed)
Pt just seen.  Refill appropriate.  #30 called in 0 refills

## 2013-04-30 ENCOUNTER — Emergency Department (HOSPITAL_BASED_OUTPATIENT_CLINIC_OR_DEPARTMENT_OTHER)
Admission: EM | Admit: 2013-04-30 | Discharge: 2013-04-30 | Disposition: A | Discharge status: Home / Self Care | Payer: Self-pay | Attending: Emergency Medicine | Admitting: Emergency Medicine

## 2013-04-30 ENCOUNTER — Encounter (HOSPITAL_BASED_OUTPATIENT_CLINIC_OR_DEPARTMENT_OTHER): Payer: Self-pay

## 2013-04-30 DIAGNOSIS — Z791 Long term (current) use of non-steroidal anti-inflammatories (NSAID): Secondary | ICD-10-CM | POA: Insufficient documentation

## 2013-04-30 DIAGNOSIS — G43909 Migraine, unspecified, not intractable, without status migrainosus: Secondary | ICD-10-CM | POA: Insufficient documentation

## 2013-04-30 DIAGNOSIS — Z880 Allergy status to penicillin: Secondary | ICD-10-CM | POA: Insufficient documentation

## 2013-04-30 DIAGNOSIS — F341 Dysthymic disorder: Secondary | ICD-10-CM | POA: Insufficient documentation

## 2013-04-30 DIAGNOSIS — Z862 Personal history of diseases of the blood and blood-forming organs and certain disorders involving the immune mechanism: Secondary | ICD-10-CM | POA: Insufficient documentation

## 2013-04-30 DIAGNOSIS — Z8639 Personal history of other endocrine, nutritional and metabolic disease: Secondary | ICD-10-CM | POA: Insufficient documentation

## 2013-04-30 DIAGNOSIS — Z8719 Personal history of other diseases of the digestive system: Secondary | ICD-10-CM | POA: Insufficient documentation

## 2013-04-30 DIAGNOSIS — Z79899 Other long term (current) drug therapy: Secondary | ICD-10-CM | POA: Insufficient documentation

## 2013-04-30 DIAGNOSIS — F17200 Nicotine dependence, unspecified, uncomplicated: Secondary | ICD-10-CM | POA: Insufficient documentation

## 2013-04-30 DIAGNOSIS — R110 Nausea: Secondary | ICD-10-CM | POA: Insufficient documentation

## 2013-04-30 DIAGNOSIS — K219 Gastro-esophageal reflux disease without esophagitis: Secondary | ICD-10-CM | POA: Insufficient documentation

## 2013-04-30 MED ORDER — DEXAMETHASONE SODIUM PHOSPHATE 10 MG/ML IJ SOLN
10.0000 mg | Freq: Once | INTRAMUSCULAR | Status: AC
  Administered 2013-04-30: 10 mg via INTRAVENOUS
  Filled 2013-04-30: qty 1

## 2013-04-30 MED ORDER — SODIUM CHLORIDE 0.9 % IV BOLUS (SEPSIS)
1000.0000 mL | Freq: Once | INTRAVENOUS | Status: AC
  Administered 2013-04-30: 1000 mL via INTRAVENOUS

## 2013-04-30 MED ORDER — PROMETHAZINE HCL 25 MG/ML IJ SOLN
25.0000 mg | Freq: Once | INTRAMUSCULAR | Status: AC
  Administered 2013-04-30: 25 mg via INTRAVENOUS
  Filled 2013-04-30: qty 1

## 2013-04-30 MED ORDER — KETOROLAC TROMETHAMINE 30 MG/ML IJ SOLN
30.0000 mg | Freq: Once | INTRAMUSCULAR | Status: AC
  Administered 2013-04-30: 30 mg via INTRAVENOUS
  Filled 2013-04-30: qty 1

## 2013-04-30 MED ORDER — DIPHENHYDRAMINE HCL 50 MG/ML IJ SOLN
25.0000 mg | Freq: Once | INTRAMUSCULAR | Status: AC
  Administered 2013-04-30: 25 mg via INTRAVENOUS
  Filled 2013-04-30: qty 1

## 2013-04-30 MED ORDER — METOCLOPRAMIDE HCL 5 MG/ML IJ SOLN
10.0000 mg | Freq: Once | INTRAMUSCULAR | Status: AC
  Administered 2013-04-30: 10 mg via INTRAVENOUS
  Filled 2013-04-30: qty 2

## 2013-04-30 MED ORDER — HYDROMORPHONE HCL PF 1 MG/ML IJ SOLN
1.0000 mg | Freq: Once | INTRAMUSCULAR | Status: AC
  Administered 2013-04-30: 1 mg via INTRAVENOUS
  Filled 2013-04-30: qty 1

## 2013-04-30 MED ORDER — ONDANSETRON 4 MG PO TBDP: 4.0000 mg | ORAL_TABLET | Freq: Three times a day (TID) | ORAL | Status: DC | PRN

## 2013-04-30 MED ORDER — OXYCODONE-ACETAMINOPHEN 5-325 MG PO TABS: 2.0000 | ORAL_TABLET | ORAL | Status: DC | PRN

## 2013-04-30 NOTE — ED Provider Notes (Signed)
History/physical exam/procedure(s) were performed by non-physician practitioner and as supervising physician I was immediately available for consultation/collaboration. I have reviewed all notes and am in agreement with care and plan.   Lolamae Voisin S Eulalia Ellerman, MD 04/30/13 2201 

## 2013-04-30 NOTE — ED Provider Notes (Signed)
History     CSN: [id number]  Arrival date & time 04/30/13  [name]   First MD Initiated Contact with Patient 04/30/13 1905      Chief Complaint  Patient presents with  . Migraine    (Consider location/radiation/quality/duration/timing/severity/associated sxs/prior treatment) HPI Comments: Patient is a 47 year old female with a past medical history of chronic migraines who presents with a headache for 4 days. Patient reports a gradual onset and progressive worsening of the headache. The pain is sharp, constant and is located in generalized head without radiation. Patient has tried nothing for symptoms without relief. No alleviating/aggravating factors. Patient reports associated nausea and photophobia. Patient denies fever, vomiting, diarrhea, numbness/tingling, weakness, visual changes, congestion, chest pain, SOB, abdominal pain.     Patient is a 47 y.o. female presenting with migraines.  Migraine Associated symptoms include headaches and nausea.    Past Medical History  Diagnosis Date  . GERD (gastroesophageal reflux disease)   . IBS (irritable bowel syndrome)   . Migraines   . Hypercholesteremia   . Anxiety and depression   . Panic attack     History reviewed. No pertinent past surgical history.  Family History  Problem Relation Age of Onset  . Alzheimer's disease Mother     History  Substance Use Topics  . Smoking status: Current Some Day Smoker    Types: Cigarettes  . Smokeless tobacco: Not on file  . Alcohol Use: No    OB History   Grav Para Term Preterm Abortions TAB SAB Ect Mult Living                  Review of Systems  Gastrointestinal: Positive for nausea.  Neurological: Positive for headaches.  All other systems reviewed and are negative.    Allergies  Cephalexin; Clarithromycin; Penicillins; and Tramadol  Home Medications   Current Outpatient Rx  Name  Route  Sig  Dispense  Refill  . butorphanol (STADOL) 10 MG/ML nasal spray      1  spray each nostril every 6 hrs, no more than twice daily   2.5 mL   0   . cetirizine (ZYRTEC) 10 MG tablet   Oral   Take 1 tablet (10 mg total) by mouth daily.   30 tablet   11   . clonazePAM (KLONOPIN) 0.5 MG tablet      TAKE ONE TABLET BY MOUTH ONCE DAILY AS NEEDED FOR ANXIETY   30 tablet   0   . FLUoxetine (PROZAC) 40 MG capsule   Oral   Take 40 mg by mouth daily.         Marland Kitchen gabapentin (NEURONTIN) 100 MG capsule   Oral   Take 100 mg by mouth 4 (four) times daily.         Marland Kitchen ibuprofen (ADVIL,MOTRIN) 200 MG tablet   Oral   Take 4 tablets (800 mg total) by mouth every 6 (six) hours as needed for pain. For pain   30 tablet   0   . lamoTRIgine (LAMICTAL) 100 MG tablet   Oral   Take 1 tablet (100 mg total) by mouth 2 (two) times daily.   60 tablet   12   . lamoTRIgine (LAMICTAL) 25 MG tablet      One po bid xone week, then 2 tabs bid xone week, then 3 tabs bid x one week . Then new Rx 100mg  bid.   200 tablet   0   . naproxen (NAPROSYN) 500 MG tablet  Oral   Take 1 tablet (500 mg total) by mouth 2 (two) times daily with a meal.   60 tablet   1   . ondansetron (ZOFRAN) 4 MG tablet   Oral   Take 1 tablet (4 mg total) by mouth every 8 (eight) hours as needed for nausea.   10 tablet   0   . propranolol ER (INDERAL LA) 80 MG 24 hr capsule   Oral   Take 80 mg by mouth daily.         . ranitidine (ZANTAC) 150 MG tablet   Oral   Take 1 tablet (150 mg total) by mouth at bedtime.   30 tablet   11   . zolmitriptan (ZOMIG) 5 MG tablet   Oral   Take 1 tablet (5 mg total) by mouth as needed for migraine. May repeat one in 2 hours, maximum 2 tabs in 24 hours.   10 tablet   0     BP 135/71  Pulse 66  Temp(Src) 99.2 F (37.3 C) (Oral)  Resp 20  Ht 5\' 6"  (1.676 m)  Wt 150 lb (68.04 kg)  BMI 24.22 kg/m2  SpO2 100%  LMP 03/15/2013  Physical Exam  Nursing note and vitals reviewed. Constitutional: She appears well-developed and well-nourished. No  distress.  HENT:  Head: Normocephalic and atraumatic.  Eyes: Conjunctivae and EOM are normal.  Neck: Normal range of motion.  Cardiovascular: Normal rate and regular rhythm.  Exam reveals no gallop and no friction rub.   No murmur heard. Pulmonary/Chest: Effort normal and breath sounds normal. She has no wheezes. She has no rales. She exhibits no tenderness.  Abdominal: Soft. There is no tenderness.  Musculoskeletal: Normal range of motion.  Neurological: She is alert.  Speech is goal-oriented. Moves limbs without ataxia.   Skin: Skin is warm and dry.  Psychiatric: She has a normal mood and affect. Her behavior is normal.    ED Course  Procedures (including critical care time)  Labs Reviewed - No data to display No results found.   1. Migraine       MDM  8:19 PM Patient given migraine cocktail of toradol, reglan, and benadryl which did not provide much relief. Vitals stable and patient afebrile. Patient will have phenergan and decadron.   9:35 PM Patient will have dilaudid and be discharged. Vitals stable and patient afebrile. Patient instructed to return with worsening or concerning symptoms.       Emilia Beck, PA-C 04/30/13 2135

## 2013-04-30 NOTE — ED Notes (Signed)
Migraine HA, nausea x 4 days-pt with steady gait into triage with sunglasses-

## 2013-05-20 ENCOUNTER — Emergency Department (HOSPITAL_BASED_OUTPATIENT_CLINIC_OR_DEPARTMENT_OTHER)
Admission: EM | Admit: 2013-05-20 | Discharge: 2013-05-20 | Disposition: A | Discharge status: Home / Self Care | Payer: Self-pay | Attending: Emergency Medicine | Admitting: Emergency Medicine

## 2013-05-20 ENCOUNTER — Encounter (HOSPITAL_BASED_OUTPATIENT_CLINIC_OR_DEPARTMENT_OTHER): Payer: Self-pay

## 2013-05-20 DIAGNOSIS — Z8719 Personal history of other diseases of the digestive system: Secondary | ICD-10-CM | POA: Insufficient documentation

## 2013-05-20 DIAGNOSIS — G43909 Migraine, unspecified, not intractable, without status migrainosus: Secondary | ICD-10-CM | POA: Insufficient documentation

## 2013-05-20 DIAGNOSIS — Z79899 Other long term (current) drug therapy: Secondary | ICD-10-CM | POA: Insufficient documentation

## 2013-05-20 DIAGNOSIS — Z880 Allergy status to penicillin: Secondary | ICD-10-CM | POA: Insufficient documentation

## 2013-05-20 DIAGNOSIS — E7800 Pure hypercholesterolemia, unspecified: Secondary | ICD-10-CM | POA: Insufficient documentation

## 2013-05-20 DIAGNOSIS — K219 Gastro-esophageal reflux disease without esophagitis: Secondary | ICD-10-CM | POA: Insufficient documentation

## 2013-05-20 DIAGNOSIS — F341 Dysthymic disorder: Secondary | ICD-10-CM | POA: Insufficient documentation

## 2013-05-20 DIAGNOSIS — F17200 Nicotine dependence, unspecified, uncomplicated: Secondary | ICD-10-CM | POA: Insufficient documentation

## 2013-05-20 MED ORDER — KETOROLAC TROMETHAMINE 30 MG/ML IJ SOLN
30.0000 mg | Freq: Once | INTRAMUSCULAR | Status: AC
  Administered 2013-05-20: 30 mg via INTRAVENOUS
  Filled 2013-05-20: qty 1

## 2013-05-20 MED ORDER — DIPHENHYDRAMINE HCL 50 MG/ML IJ SOLN
12.5000 mg | Freq: Once | INTRAMUSCULAR | Status: AC
  Administered 2013-05-20: 12.5 mg via INTRAVENOUS
  Filled 2013-05-20: qty 1

## 2013-05-20 MED ORDER — HYDROMORPHONE HCL PF 1 MG/ML IJ SOLN
1.0000 mg | Freq: Once | INTRAMUSCULAR | Status: AC
  Administered 2013-05-20: 1 mg via INTRAVENOUS
  Filled 2013-05-20: qty 1

## 2013-05-20 MED ORDER — SODIUM CHLORIDE 0.9 % IV SOLN
Freq: Once | INTRAVENOUS | Status: AC
  Administered 2013-05-20: 18:00:00 via INTRAVENOUS

## 2013-05-20 MED ORDER — METOCLOPRAMIDE HCL 5 MG/ML IJ SOLN
10.0000 mg | Freq: Once | INTRAMUSCULAR | Status: AC
  Administered 2013-05-20: 10 mg via INTRAVENOUS
  Filled 2013-05-20 (×2): qty 2

## 2013-05-20 NOTE — ED Notes (Signed)
Pt ambulating independently w/ steady gait on d/c in no acute distress, A&Ox4.D/c instructions reviewed w/ pt and family - pt and family deny any further questions or concerns at present.  

## 2013-05-20 NOTE — ED Provider Notes (Signed)
History    CSN: [id number] Arrival date & time 05/20/13  1711  First MD Initiated Contact with Patient 05/20/13 1723     Chief Complaint  Patient presents with  . Migraine   (Consider location/radiation/quality/duration/timing/severity/associated sxs/prior Treatment) Patient is a 47 y.o. female presenting with migraines. The history is provided by the patient.  Migraine The current episode started in the past 7 days. The problem occurs constantly. The problem has been gradually worsening. Nothing aggravates the symptoms. She has tried nothing for the symptoms.  Pt reports she has had multiple migranes in the past.  Pt has been treated for similiar Past Medical History  Diagnosis Date  . GERD (gastroesophageal reflux disease)   . IBS (irritable bowel syndrome)   . Migraines   . Hypercholesteremia   . Anxiety and depression   . Panic attack    History reviewed. No pertinent past surgical history. Family History  Problem Relation Age of Onset  . Alzheimer's disease Mother    History  Substance Use Topics  . Smoking status: Current Some Day Smoker    Types: Cigarettes  . Smokeless tobacco: Not on file  . Alcohol Use: No   OB History   Grav Para Term Preterm Abortions TAB SAB Ect Mult Living                 Review of Systems  All other systems reviewed and are negative.    Allergies  Cephalexin; Clarithromycin; Penicillins; and Tramadol  Home Medications   Current Outpatient Rx  Name  Route  Sig  Dispense  Refill  . cetirizine (ZYRTEC) 10 MG tablet   Oral   Take 1 tablet (10 mg total) by mouth daily.   30 tablet   11   . clonazePAM (KLONOPIN) 0.5 MG tablet      TAKE ONE TABLET BY MOUTH ONCE DAILY AS NEEDED FOR ANXIETY   30 tablet   0   . FLUoxetine (PROZAC) 40 MG capsule   Oral   Take 40 mg by mouth daily.         Marland Kitchen gabapentin (NEURONTIN) 100 MG capsule   Oral   Take 100 mg by mouth 4 (four) times daily.         Marland Kitchen ibuprofen (ADVIL,MOTRIN) 200  MG tablet   Oral   Take 4 tablets (800 mg total) by mouth every 6 (six) hours as needed for pain. For pain   30 tablet   0   . lamoTRIgine (LAMICTAL) 100 MG tablet   Oral   Take 1 tablet (100 mg total) by mouth 2 (two) times daily.   60 tablet   12   . lamoTRIgine (LAMICTAL) 25 MG tablet      One po bid xone week, then 2 tabs bid xone week, then 3 tabs bid x one week . Then new Rx 100mg  bid.   200 tablet   0   . propranolol ER (INDERAL LA) 80 MG 24 hr capsule   Oral   Take 80 mg by mouth daily.         . ranitidine (ZANTAC) 150 MG tablet   Oral   Take 1 tablet (150 mg total) by mouth at bedtime.   30 tablet   11   . zolmitriptan (ZOMIG) 5 MG tablet   Oral   Take 1 tablet (5 mg total) by mouth as needed for migraine. May repeat one in 2 hours, maximum 2 tabs in 24 hours.   10 tablet  0    BP 140/86  Pulse 100  Temp(Src) 99.1 F (37.3 C) (Oral)  Resp 16  Ht 5\' 5"  (1.651 m)  Wt 150 lb (68.04 kg)  BMI 24.96 kg/m2  SpO2 100%  LMP 05/08/2013 Physical Exam  Nursing note and vitals reviewed. Constitutional: She is oriented to person, place, and time. She appears well-developed and well-nourished.  HENT:  Head: Normocephalic and atraumatic.  Eyes: Pupils are equal, round, and reactive to light.  Neck: Normal range of motion.  Cardiovascular: Normal rate.   Pulmonary/Chest: Effort normal.  Musculoskeletal: Normal range of motion.  Neurological: She is alert and oriented to person, place, and time. She has normal reflexes.  Skin: Skin is warm.  Psychiatric: She has a normal mood and affect.    ED Course  Procedures (including critical care time) Labs Reviewed - No data to display No results found. No diagnosis found.  MDM  Pt given Iv NS and migrane coccktail.  No relief  Pt given IV dilaudid.   Pt advised to see her MD ofr recheck  Elson Areas, PA-C 05/20/13 1910

## 2013-05-20 NOTE — ED Notes (Signed)
Karen Sofia, PA-C at bedside 

## 2013-05-20 NOTE — ED Notes (Signed)
Pt refused to take pants off. States "im here for a migraine"

## 2013-05-20 NOTE — ED Notes (Signed)
Pt reports a 4 day history of a migraine and nausea/vomiting

## 2013-05-21 NOTE — ED Provider Notes (Signed)
History/physical exam/procedure(s) were performed by non-physician practitioner and as supervising physician I was immediately available for consultation/collaboration. I have reviewed all notes and am in agreement with care and plan.   Hilario Quarry, MD 05/21/13 2231

## 2013-06-21 ENCOUNTER — Telehealth: Payer: Self-pay | Admitting: Family Medicine

## 2013-06-21 MED ORDER — CLONAZEPAM 0.5 MG PO TABS: 0.5000 mg | ORAL_TABLET | Freq: Every day | ORAL | Status: DC | PRN

## 2013-06-21 NOTE — Telephone Encounter (Signed)
OV 03/19/13  Last refill 04/19/13  Refill appropriate  #30 called no refills.

## 2013-06-28 ENCOUNTER — Encounter (HOSPITAL_BASED_OUTPATIENT_CLINIC_OR_DEPARTMENT_OTHER): Payer: Self-pay | Admitting: *Deleted

## 2013-06-28 ENCOUNTER — Emergency Department (HOSPITAL_BASED_OUTPATIENT_CLINIC_OR_DEPARTMENT_OTHER)
Admission: EM | Admit: 2013-06-28 | Discharge: 2013-06-28 | Disposition: A | Discharge status: Home / Self Care | Payer: Self-pay | Attending: Emergency Medicine | Admitting: Emergency Medicine

## 2013-06-28 DIAGNOSIS — H53149 Visual discomfort, unspecified: Secondary | ICD-10-CM | POA: Insufficient documentation

## 2013-06-28 DIAGNOSIS — F341 Dysthymic disorder: Secondary | ICD-10-CM | POA: Insufficient documentation

## 2013-06-28 DIAGNOSIS — Z880 Allergy status to penicillin: Secondary | ICD-10-CM | POA: Insufficient documentation

## 2013-06-28 DIAGNOSIS — F17200 Nicotine dependence, unspecified, uncomplicated: Secondary | ICD-10-CM | POA: Insufficient documentation

## 2013-06-28 DIAGNOSIS — R112 Nausea with vomiting, unspecified: Secondary | ICD-10-CM | POA: Insufficient documentation

## 2013-06-28 DIAGNOSIS — Z79899 Other long term (current) drug therapy: Secondary | ICD-10-CM | POA: Insufficient documentation

## 2013-06-28 DIAGNOSIS — K219 Gastro-esophageal reflux disease without esophagitis: Secondary | ICD-10-CM | POA: Insufficient documentation

## 2013-06-28 DIAGNOSIS — G43909 Migraine, unspecified, not intractable, without status migrainosus: Secondary | ICD-10-CM | POA: Insufficient documentation

## 2013-06-28 DIAGNOSIS — Z8719 Personal history of other diseases of the digestive system: Secondary | ICD-10-CM | POA: Insufficient documentation

## 2013-06-28 DIAGNOSIS — J3489 Other specified disorders of nose and nasal sinuses: Secondary | ICD-10-CM | POA: Insufficient documentation

## 2013-06-28 DIAGNOSIS — R059 Cough, unspecified: Secondary | ICD-10-CM | POA: Insufficient documentation

## 2013-06-28 DIAGNOSIS — R05 Cough: Secondary | ICD-10-CM | POA: Insufficient documentation

## 2013-06-28 DIAGNOSIS — Z8639 Personal history of other endocrine, nutritional and metabolic disease: Secondary | ICD-10-CM | POA: Insufficient documentation

## 2013-06-28 DIAGNOSIS — Z862 Personal history of diseases of the blood and blood-forming organs and certain disorders involving the immune mechanism: Secondary | ICD-10-CM | POA: Insufficient documentation

## 2013-06-28 MED ORDER — PROMETHAZINE HCL 25 MG/ML IJ SOLN
25.0000 mg | Freq: Once | INTRAMUSCULAR | Status: AC
  Administered 2013-06-28: 25 mg via INTRAVENOUS
  Filled 2013-06-28: qty 1

## 2013-06-28 MED ORDER — HYDROMORPHONE HCL PF 1 MG/ML IJ SOLN
1.0000 mg | Freq: Once | INTRAMUSCULAR | Status: AC
  Administered 2013-06-28: 1 mg via INTRAVENOUS
  Filled 2013-06-28: qty 1

## 2013-06-28 MED ORDER — KETOROLAC TROMETHAMINE 30 MG/ML IJ SOLN
30.0000 mg | Freq: Once | INTRAMUSCULAR | Status: AC
  Administered 2013-06-28: 30 mg via INTRAVENOUS
  Filled 2013-06-28: qty 1

## 2013-06-28 MED ORDER — SODIUM CHLORIDE 0.9 % IV BOLUS (SEPSIS)
1000.0000 mL | Freq: Once | INTRAVENOUS | Status: AC
  Administered 2013-06-28: 1000 mL via INTRAVENOUS

## 2013-06-28 MED ORDER — ONDANSETRON HCL 4 MG/2ML IJ SOLN
4.0000 mg | Freq: Once | INTRAMUSCULAR | Status: AC
  Administered 2013-06-28: 4 mg via INTRAVENOUS
  Filled 2013-06-28: qty 2

## 2013-06-28 MED ORDER — DEXAMETHASONE SODIUM PHOSPHATE 10 MG/ML IJ SOLN
10.0000 mg | Freq: Once | INTRAMUSCULAR | Status: AC
  Administered 2013-06-28: 10 mg via INTRAVENOUS
  Filled 2013-06-28: qty 1

## 2013-06-28 MED ORDER — METOCLOPRAMIDE HCL 5 MG/ML IJ SOLN
10.0000 mg | Freq: Once | INTRAMUSCULAR | Status: AC
  Administered 2013-06-28: 10 mg via INTRAVENOUS
  Filled 2013-06-28: qty 2

## 2013-06-28 MED ORDER — DIPHENHYDRAMINE HCL 50 MG/ML IJ SOLN
25.0000 mg | Freq: Once | INTRAMUSCULAR | Status: AC
  Administered 2013-06-28: 25 mg via INTRAVENOUS
  Filled 2013-06-28: qty 1

## 2013-06-28 NOTE — ED Notes (Signed)
Pt c/o migraine x4 days

## 2013-06-28 NOTE — ED Provider Notes (Signed)
CSN: [id number]     Arrival date & time 06/28/13  1847 History  This chart was scribed for Cassidy Octave, MD by Ladona Ridgel Day, ED scribe. This patient was seen in room MH03/MH03 and the patient's care was started at 1950.   First MD Initiated Contact with Patient 06/28/13 1950     Chief Complaint  Patient presents with  . Migraine   The history is provided by the patient. No language interpreter was used.   HPI Comments: Cassidy Miles is a 47 y.o. female who presents to the Emergency Department w/hx of migraines complaining of constant, moderate to severe migraine diffusely throughout her head, onset 5 days ago and constant since onset. She states it feels similar to previous migraines without any different symptoms. She states associated photophobia, nausea, emesis (3 episodes today, 3 yesterday). She denies dizziness, light headiness. She states tried taking zomig which she usually takes for her migraines but had no relief. She states last previous migraine was 8 weeks ago.   She states also w/cough and congestion since onset of her migraine which she states precipitated her migraine. She denies fever/chills.   Neurologist - Dr. Debarah Crape She denies any other medical problems. She is allergic to PCN and keflex.  Past Medical History  Diagnosis Date  . GERD (gastroesophageal reflux disease)   . IBS (irritable bowel syndrome)   . Migraines   . Hypercholesteremia   . Anxiety and depression   . Panic attack    History reviewed. No pertinent past surgical history. Family History  Problem Relation Age of Onset  . Alzheimer's disease Mother    History  Substance Use Topics  . Smoking status: Current Some Day Smoker    Types: Cigarettes  . Smokeless tobacco: Not on file  . Alcohol Use: No   OB History   Grav Para Term Preterm Abortions TAB SAB Ect Mult Living                 Review of Systems  Constitutional: Negative for fever and chills.  HENT: Positive for congestion. Negative  for neck stiffness.   Respiratory: Positive for cough. Negative for shortness of breath.   Cardiovascular: Negative for chest pain.  Gastrointestinal: Negative for nausea, vomiting and abdominal pain.  Musculoskeletal: Negative for back pain.  Skin: Negative for color change and pallor.  Neurological: Positive for headaches (migraine HA). Negative for weakness.  All other systems reviewed and are negative.  A complete 10 system review of systems was obtained and all systems are negative except as noted in the HPI and PMH.    Allergies  Cephalexin; Clarithromycin; Penicillins; and Tramadol  Home Medications   Current Outpatient Rx  Name  Route  Sig  Dispense  Refill  . cetirizine (ZYRTEC) 10 MG tablet   Oral   Take 1 tablet (10 mg total) by mouth daily.   30 tablet   11   . clonazePAM (KLONOPIN) 0.5 MG tablet   Oral   Take 1 tablet (0.5 mg total) by mouth daily as needed for anxiety.   30 tablet   0   . FLUoxetine (PROZAC) 40 MG capsule   Oral   Take 40 mg by mouth daily.         Marland Kitchen gabapentin (NEURONTIN) 100 MG capsule   Oral   Take 100 mg by mouth 4 (four) times daily.         Marland Kitchen ibuprofen (ADVIL,MOTRIN) 200 MG tablet   Oral   Take 4  tablets (800 mg total) by mouth every 6 (six) hours as needed for pain. For pain   30 tablet   0   . lamoTRIgine (LAMICTAL) 100 MG tablet   Oral   Take 1 tablet (100 mg total) by mouth 2 (two) times daily.   60 tablet   12   . lamoTRIgine (LAMICTAL) 25 MG tablet      One po bid xone week, then 2 tabs bid xone week, then 3 tabs bid x one week . Then new Rx 100mg  bid.   200 tablet   0   . propranolol ER (INDERAL LA) 80 MG 24 hr capsule   Oral   Take 80 mg by mouth daily.         . ranitidine (ZANTAC) 150 MG tablet   Oral   Take 1 tablet (150 mg total) by mouth at bedtime.   30 tablet   11   . zolmitriptan (ZOMIG) 5 MG tablet   Oral   Take 1 tablet (5 mg total) by mouth as needed for migraine. May repeat one in 2  hours, maximum 2 tabs in 24 hours.   10 tablet   0    Triage Vitals: BP 132/76  Pulse 84  Temp(Src) 98.8 F (37.1 C) (Oral)  Resp 16  Ht 5\' 6"  (1.676 m)  Wt 150 lb (68.04 kg)  BMI 24.22 kg/m2  SpO2 100%  LMP 06/14/2013 Physical Exam  Nursing note and vitals reviewed. Constitutional: She is oriented to person, place, and time. She appears well-developed and well-nourished. No distress.  HENT:  Head: Normocephalic and atraumatic.  Eyes: EOM are normal. Pupils are equal, round, and reactive to light.  Neck: Neck supple. No tracheal deviation present.  No meningismus   Cardiovascular: Normal rate, regular rhythm and normal heart sounds.   No murmur heard. Pulmonary/Chest: Effort normal. No respiratory distress. She has no wheezes. She has no rales.  Abdominal: Soft. She exhibits no distension. There is no tenderness.  Musculoskeletal: Normal range of motion.  Neurological: She is alert and oriented to person, place, and time.  5/5 strength throughout, no ataxia finger to nose, no pronator drift.  CN 2-12 intact, no ataxia on finger to nose, no nystagmus, 5/5 strength throughout, no pronator drift, Romberg negative, normal gait.   Skin: Skin is warm and dry.  Psychiatric: She has a normal mood and affect. Her behavior is normal.    ED Course   Procedures (including critical care time) DIAGNOSTIC STUDIES: Oxygen Saturation is 100% on room air, normal by my interpretation.    COORDINATION OF CARE: At 800 PM Discussed treatment plan with patient which includes IV fluids, zofran, toradol, reglan, benadryl. Patient agrees.   Labs Reviewed - No data to display No results found. 1. Headache     MDM  Gradual onset headache x 4 days that is similar to previous migraines. Associated with nausea, photophobia..  Denies thunderclap onset.   Neuro exam intact. No meningismus.  IVF, headache cocktail given with some improvement. No evidence of SAH or meningitis. Stable for  followup with neurology.  I personally performed the services described in this documentation, which was scribed in my presence. The recorded information has been reviewed and is accurate.   Cassidy Octave, MD 06/28/13 [phone]

## 2013-06-29 ENCOUNTER — Ambulatory Visit: Payer: Self-pay | Admitting: Nurse Practitioner

## 2013-07-05 ENCOUNTER — Encounter: Payer: Self-pay | Admitting: Physician Assistant

## 2013-07-05 ENCOUNTER — Ambulatory Visit (INDEPENDENT_AMBULATORY_CARE_PROVIDER_SITE_OTHER): Payer: Self-pay | Admitting: Physician Assistant

## 2013-07-05 VITALS — BP 120/80 | HR 100 | Temp 98.7°F | Resp 20 | Ht 64.0 in | Wt 159.0 lb

## 2013-07-05 DIAGNOSIS — J988 Other specified respiratory disorders: Secondary | ICD-10-CM

## 2013-07-05 DIAGNOSIS — A499 Bacterial infection, unspecified: Secondary | ICD-10-CM

## 2013-07-05 MED ORDER — AZITHROMYCIN 250 MG PO TABS: ORAL_TABLET | ORAL | Status: DC

## 2013-07-05 MED ORDER — HYDROCODONE-HOMATROPINE 5-1.5 MG/5ML PO SYRP: 5.0000 mL | ORAL_SOLUTION | Freq: Three times a day (TID) | ORAL | Status: DC | PRN

## 2013-07-05 NOTE — Progress Notes (Signed)
Patient ID: Cassidy Miles MRN: [id number], DOB: 11-18-1966, 47 y.o. Date of Encounter: 07/05/2013, 3:41 PM    Chief Complaint:  Chief Complaint  Patient presents with  . c/o harsh cough, sinus congestion    OTC no help     HPI: 47 y.o. year old white female reports that she has been sick for 3 weeks. She has been having some pressure in her sinuses. Drainage down her throat. And now with significant cough productive of thick dark phlegm. No significant sore throat ear ache or fevers or chills. She has used multiple over-the-counter medications without relief. She is getting minimal sleep secondary to recurrent cough at night.  Home Meds: See attached medication section for any medications that were entered at today's visit. The computer does not put those onto this list.The following list is a list of meds entered prior to today's visit.   Current Outpatient Prescriptions on File Prior to Visit  Medication Sig Dispense Refill  . cetirizine (ZYRTEC) 10 MG tablet Take 1 tablet (10 mg total) by mouth daily.  30 tablet  11  . clonazePAM (KLONOPIN) 0.5 MG tablet Take 1 tablet (0.5 mg total) by mouth daily as needed for anxiety.  30 tablet  0  . FLUoxetine (PROZAC) 40 MG capsule Take 40 mg by mouth daily.      Marland Kitchen gabapentin (NEURONTIN) 100 MG capsule Take 100 mg by mouth 4 (four) times daily.      Marland Kitchen ibuprofen (ADVIL,MOTRIN) 200 MG tablet Take 4 tablets (800 mg total) by mouth every 6 (six) hours as needed for pain. For pain  30 tablet  0  . lamoTRIgine (LAMICTAL) 100 MG tablet Take 1 tablet (100 mg total) by mouth 2 (two) times daily.  60 tablet  12  . lamoTRIgine (LAMICTAL) 25 MG tablet One po bid xone week, then 2 tabs bid xone week, then 3 tabs bid x one week . Then new Rx 100mg  bid.  200 tablet  0  . propranolol ER (INDERAL LA) 80 MG 24 hr capsule Take 80 mg by mouth daily.      . ranitidine (ZANTAC) 150 MG tablet Take 1 tablet (150 mg total) by mouth at bedtime.  30 tablet  11  .  zolmitriptan (ZOMIG) 5 MG tablet Take 1 tablet (5 mg total) by mouth as needed for migraine. May repeat one in 2 hours, maximum 2 tabs in 24 hours.  10 tablet  0   No current facility-administered medications on file prior to visit.    Allergies:  Allergies  Allergen Reactions  . Cephalexin Hives  . Clarithromycin Hives  . Penicillins Hives  . Tramadol Nausea Only      Review of Systems: See HPI for pertinent ROS. All other ROS negative.    Physical Exam: Blood pressure 120/80, pulse 100, temperature 98.7 F (37.1 C), temperature source Oral, resp. rate 20, height 5\' 4"  (1.626 m), weight 159 lb (72.122 kg), last menstrual period 06/14/2013., Body mass index is 27.28 kg/(m^2). General:  Well-nourished well-developed white female. Appears in no acute distress. HEENT: Normocephalic, atraumatic, eyes without discharge, sclera non-icteric, nares are without discharge. Bilateral auditory canals clear, TM's are without perforation, pearly grey and translucent with reflective cone of light bilaterally. Oral cavity moist, posterior pharynx without exudate, erythema, peritonsillar abscess, or post nasal drip.frontal and maxillary sinuses with minimal to mild tenderness with percussion  Neck: Supple. No thyromegaly. No lymphadenopathy. Lungs: Clear bilaterally to auscultation without wheezes, rales, or rhonchi. Breathing is unlabored.  Heart: Regular rhythm. No murmurs, rubs, or gallops. Msk:  Strength and tone normal for age. Extremities/Skin: Warm and dry.  Neuro: Alert and oriented X 3. Moves all extremities spontaneously. Gait is normal. CNII-XII grossly in tact. Psych:  Responds to questions appropriately with a normal affect.     ASSESSMENT AND PLAN:  47 y.o. year old female with  1. Bacterial lower respiratory infection - azithromycin (ZITHROMAX) 250 MG tablet; Day 1: Take 2 daily Days 2-5: Take 1 daily.  Dispense: 6 tablet; Refill: 0 - HYDROcodone-homatropine (HYCODAN) 5-1.5 MG/5ML  syrup; Take 5 mL by mouth every 8 (eight) hours as needed for cough.  Dispense: 120 mL; Refill: 0  followup if symptoms do not resolve  Signed, [address] [place], [place], [facility] 07/05/2013 3:41 PM

## 2013-07-08 ENCOUNTER — Telehealth: Payer: Self-pay | Admitting: Physician Assistant

## 2013-07-09 NOTE — Telephone Encounter (Signed)
Pt called back to report still coughing.  Hycodan has not helped much.  Told her nothing much stronger then that.  Asked her if uses expectorant.  Stated taking Mucinex.  Told to push fluids.  Take Hycodan and Mucinex around the clock as directed.  If cough still  not improved by Monday will NTBS again.

## 2013-07-09 NOTE — Telephone Encounter (Signed)
Agree 

## 2013-07-12 ENCOUNTER — Encounter: Payer: Self-pay | Admitting: Physician Assistant

## 2013-07-12 ENCOUNTER — Telehealth: Payer: Self-pay | Admitting: Physician Assistant

## 2013-07-12 ENCOUNTER — Ambulatory Visit (INDEPENDENT_AMBULATORY_CARE_PROVIDER_SITE_OTHER): Payer: Medicaid Other | Admitting: Physician Assistant

## 2013-07-12 VITALS — BP 124/88 | HR 92 | Temp 97.7°F | Resp 18 | Ht 63.5 in | Wt 158.0 lb

## 2013-07-12 DIAGNOSIS — A499 Bacterial infection, unspecified: Secondary | ICD-10-CM

## 2013-07-12 DIAGNOSIS — J988 Other specified respiratory disorders: Secondary | ICD-10-CM

## 2013-07-12 MED ORDER — HYDROCOD POLST-CHLORPHEN POLST 10-8 MG/5ML PO LQCR: 5.0000 mL | Freq: Two times a day (BID) | ORAL | Status: DC | PRN

## 2013-07-12 MED ORDER — PREDNISONE 20 MG PO TABS: ORAL_TABLET | ORAL | Status: DC

## 2013-07-12 MED ORDER — LEVOFLOXACIN 750 MG PO TABS: 750.0000 mg | ORAL_TABLET | Freq: Every day | ORAL | Status: DC

## 2013-07-12 NOTE — Progress Notes (Signed)
Patient ID: Cassidy Miles MRN: [id number], DOB: March 04, 1966, 47 y.o. Date of Encounter: 07/12/2013, 11:27 AM    Chief Complaint:  Chief Complaint  Patient presents with  . coughing, congestion     HPI: 47 y.o. year old white female who I recently saw for upper respiratory infection. Prescribed azithromycin. She states that she did complete all the antibiotic as directed. However she is here today for followup she still has lots of pressure and pain in her maxillary sinuses. She is unable to blow very much out of her nose. Her throat feels irritated and she still has significant cough.  Home Meds: See attached medication section for any medications that were entered at today's visit. The computer does not put those onto this list.The following list is a list of meds entered prior to today's visit.   Current Outpatient Prescriptions on File Prior to Visit  Medication Sig Dispense Refill  . cetirizine (ZYRTEC) 10 MG tablet Take 1 tablet (10 mg total) by mouth daily.  30 tablet  11  . clonazePAM (KLONOPIN) 0.5 MG tablet Take 1 tablet (0.5 mg total) by mouth daily as needed for anxiety.  30 tablet  0  . FLUoxetine (PROZAC) 40 MG capsule Take 40 mg by mouth daily.      Marland Kitchen gabapentin (NEURONTIN) 100 MG capsule Take 100 mg by mouth 4 (four) times daily.      Marland Kitchen HYDROcodone-homatropine (HYCODAN) 5-1.5 MG/5ML syrup Take 5 mL by mouth every 8 (eight) hours as needed for cough.  120 mL  0  . ibuprofen (ADVIL,MOTRIN) 200 MG tablet Take 4 tablets (800 mg total) by mouth every 6 (six) hours as needed for pain. For pain  30 tablet  0  . lamoTRIgine (LAMICTAL) 100 MG tablet Take 1 tablet (100 mg total) by mouth 2 (two) times daily.  60 tablet  12  . lamoTRIgine (LAMICTAL) 25 MG tablet One po bid xone week, then 2 tabs bid xone week, then 3 tabs bid x one week . Then new Rx 100mg  bid.  200 tablet  0  . propranolol ER (INDERAL LA) 80 MG 24 hr capsule Take 80 mg by mouth daily.      . ranitidine (ZANTAC) 150  MG tablet Take 1 tablet (150 mg total) by mouth at bedtime.  30 tablet  11  . zolmitriptan (ZOMIG) 5 MG tablet Take 1 tablet (5 mg total) by mouth as needed for migraine. May repeat one in 2 hours, maximum 2 tabs in 24 hours.  10 tablet  0   No current facility-administered medications on file prior to visit.    Allergies:  Allergies  Allergen Reactions  . Cephalexin Hives  . Clarithromycin Hives  . Penicillins Hives  . Tramadol Nausea Only      Review of Systems: See HPI for pertinent ROS. All other ROS negative.    Physical Exam: Blood pressure 124/88, pulse 92, temperature 97.7 F (36.5 C), resp. rate 18, height 5' 3.5" (1.613 m), weight 158 lb (71.668 kg), last menstrual period 06/14/2013., Body mass index is 27.55 kg/(m^2). General: White female. Sounds very congested when she talks. Appears in no acute distress. HEENT: Normocephalic, atraumatic, eyes without discharge, sclera non-icteric, nares are without discharge. Bilateral auditory canals clear, TM's are without perforation, pearly grey and translucent with reflective cone of light bilaterally. Oral cavity moist, posterior pharynx without exudate, erythema, peritonsillar abscess. Positive tenderness with percussion of bilateral maxillary sinuses. Minimal tenderness with percussion of frontal sinuses.  Neck: Supple.  No thyromegaly. No lymphadenopathy. Lungs: Clear bilaterally to auscultation without wheezes, rales, or rhonchi. Breathing is unlabored. Lungs are clear I hear no wheezes or rhonchi. Heart: Regular rhythm. No murmurs, rubs, or gallops. Msk:  Strength and tone normal for age. Extremities/Skin: Warm and dry.  No rashes. Neuro: Alert and oriented X 3. Moves all extremities spontaneously. Gait is normal. CNII-XII grossly in tact. Psych:  Responds to questions appropriately with a normal affect.     ASSESSMENT AND PLAN:  47 y.o. year old female with  1. Bacterial respiratory infection - levofloxacin (LEVAQUIN) 750  MG tablet; Take 1 tablet (750 mg total) by mouth daily.  Dispense: 10 tablet; Refill: 0 - predniSONE (DELTASONE) 20 MG tablet; Take 3 daily for 2 days, then 2 daily for 2 days, then 1 daily for 2 days.  Dispense: 12 tablet; Refill: 0 - chlorpheniramine-HYDROcodone (TUSSIONEX) 10-8 MG/5ML LQCR; Take 5 mLs by mouth every 12 (twelve) hours as needed.  Dispense: 140 mL; Refill: 0 If symptoms do not resolve with the above she needs to followup.  [address] [place], [place], [facility] 07/12/2013 11:27 AM

## 2013-07-12 NOTE — Telephone Encounter (Signed)
Pt made an appt for today at 11:00

## 2013-07-13 ENCOUNTER — Telehealth: Payer: Self-pay | Admitting: Physician Assistant

## 2013-07-13 DIAGNOSIS — J988 Other specified respiratory disorders: Secondary | ICD-10-CM

## 2013-07-13 DIAGNOSIS — B9689 Other specified bacterial agents as the cause of diseases classified elsewhere: Secondary | ICD-10-CM

## 2013-07-13 NOTE — Telephone Encounter (Signed)
Can Give Hycodan again. Hycodan 5 ml PO Q 6 H prn cough # 150 ml / 0

## 2013-07-14 ENCOUNTER — Telehealth: Payer: Self-pay | Admitting: Family Medicine

## 2013-07-14 MED ORDER — HYDROCODONE-HOMATROPINE 5-1.5 MG/5ML PO SYRP: 5.0000 mL | ORAL_SOLUTION | Freq: Three times a day (TID) | ORAL | Status: DC | PRN

## 2013-07-14 NOTE — Telephone Encounter (Signed)
Send this to [employer]. She needs letter of Dismissal.

## 2013-07-14 NOTE — Telephone Encounter (Signed)
Pharmacist called back to state they received the order for the Hycodan because pt stated Tussinex to expensive.  Pharmacist says patient DID pick up Tussinex prescription.  Hycodan Rx from today canceled!!!!  Pharmacist also  stated patient DID NOT pick up antibiotic or prednisone prescribed!!!

## 2013-07-14 NOTE — Telephone Encounter (Signed)
rx called in

## 2013-07-15 ENCOUNTER — Telehealth: Payer: Self-pay | Admitting: Physician Assistant

## 2013-07-15 ENCOUNTER — Encounter: Payer: Self-pay | Admitting: Physician Assistant

## 2013-07-15 NOTE — Telephone Encounter (Signed)
Sent certified letter 07/15/13 [id number] [phone]

## 2013-07-20 ENCOUNTER — Emergency Department (HOSPITAL_BASED_OUTPATIENT_CLINIC_OR_DEPARTMENT_OTHER)
Admission: EM | Admit: 2013-07-20 | Discharge: 2013-07-20 | Disposition: A | Discharge status: Home / Self Care | Payer: Self-pay | Attending: Emergency Medicine | Admitting: Emergency Medicine

## 2013-07-20 ENCOUNTER — Encounter (HOSPITAL_BASED_OUTPATIENT_CLINIC_OR_DEPARTMENT_OTHER): Payer: Self-pay | Admitting: *Deleted

## 2013-07-20 DIAGNOSIS — F17200 Nicotine dependence, unspecified, uncomplicated: Secondary | ICD-10-CM | POA: Insufficient documentation

## 2013-07-20 DIAGNOSIS — Z8639 Personal history of other endocrine, nutritional and metabolic disease: Secondary | ICD-10-CM | POA: Insufficient documentation

## 2013-07-20 DIAGNOSIS — Z79899 Other long term (current) drug therapy: Secondary | ICD-10-CM | POA: Insufficient documentation

## 2013-07-20 DIAGNOSIS — K219 Gastro-esophageal reflux disease without esophagitis: Secondary | ICD-10-CM | POA: Insufficient documentation

## 2013-07-20 DIAGNOSIS — F341 Dysthymic disorder: Secondary | ICD-10-CM | POA: Insufficient documentation

## 2013-07-20 DIAGNOSIS — Z880 Allergy status to penicillin: Secondary | ICD-10-CM | POA: Insufficient documentation

## 2013-07-20 DIAGNOSIS — G43909 Migraine, unspecified, not intractable, without status migrainosus: Secondary | ICD-10-CM | POA: Insufficient documentation

## 2013-07-20 DIAGNOSIS — R1110 Vomiting, unspecified: Secondary | ICD-10-CM | POA: Insufficient documentation

## 2013-07-20 DIAGNOSIS — Z862 Personal history of diseases of the blood and blood-forming organs and certain disorders involving the immune mechanism: Secondary | ICD-10-CM | POA: Insufficient documentation

## 2013-07-20 MED ORDER — HYDROMORPHONE HCL PF 1 MG/ML IJ SOLN
1.0000 mg | Freq: Once | INTRAMUSCULAR | Status: AC
  Administered 2013-07-20: 1 mg via INTRAVENOUS
  Filled 2013-07-20: qty 1

## 2013-07-20 MED ORDER — DIPHENHYDRAMINE HCL 50 MG/ML IJ SOLN
25.0000 mg | Freq: Once | INTRAMUSCULAR | Status: AC
  Administered 2013-07-20: 25 mg via INTRAVENOUS
  Filled 2013-07-20: qty 1

## 2013-07-20 MED ORDER — KETOROLAC TROMETHAMINE 30 MG/ML IJ SOLN
30.0000 mg | Freq: Once | INTRAMUSCULAR | Status: AC
  Administered 2013-07-20: 30 mg via INTRAVENOUS
  Filled 2013-07-20: qty 1

## 2013-07-20 MED ORDER — METOCLOPRAMIDE HCL 5 MG/ML IJ SOLN
10.0000 mg | Freq: Once | INTRAMUSCULAR | Status: AC
  Administered 2013-07-20: 10 mg via INTRAVENOUS
  Filled 2013-07-20: qty 2

## 2013-07-20 NOTE — ED Provider Notes (Signed)
CSN: [id number]     Arrival date & time 07/20/13  1640 History   First MD Initiated Contact with Patient 07/20/13 1652     Chief Complaint  Patient presents with  . Migraine   (Consider location/radiation/quality/duration/timing/severity/associated sxs/prior Treatment) HPI Comments: Pt states that she tried her zomig at home without relief  Patient is a 47 y.o. female presenting with migraines. The history is provided by the patient. No language interpreter was used.  Migraine This is a recurrent problem. The current episode started in the past 7 days. The problem occurs constantly. The problem has been unchanged. Associated symptoms include vomiting. Pertinent negatives include no fever. Exacerbated by: light and sound. She has tried nothing for the symptoms.    Past Medical History  Diagnosis Date  . GERD (gastroesophageal reflux disease)   . IBS (irritable bowel syndrome)   . Migraines   . Hypercholesteremia   . Anxiety and depression   . Panic attack    History reviewed. No pertinent past surgical history. Family History  Problem Relation Age of Onset  . Alzheimer's disease Mother    History  Substance Use Topics  . Smoking status: Current Some Day Smoker    Types: Cigarettes  . Smokeless tobacco: Not on file  . Alcohol Use: No   OB History   Grav Para Term Preterm Abortions TAB SAB Ect Mult Living                 Review of Systems  Constitutional: Negative for fever.  Respiratory: Negative.   Cardiovascular: Negative.   Gastrointestinal: Positive for vomiting.    Allergies  Cephalexin; Clarithromycin; Penicillins; and Tramadol  Home Medications   Current Outpatient Rx  Name  Route  Sig  Dispense  Refill  . cetirizine (ZYRTEC) 10 MG tablet   Oral   Take 1 tablet (10 mg total) by mouth daily.   30 tablet   11   . clonazePAM (KLONOPIN) 0.5 MG tablet   Oral   Take 1 tablet (0.5 mg total) by mouth daily as needed for anxiety.   30 tablet   0   .  FLUoxetine (PROZAC) 40 MG capsule   Oral   Take 40 mg by mouth daily.         Marland Kitchen gabapentin (NEURONTIN) 100 MG capsule   Oral   Take 100 mg by mouth 4 (four) times daily.         Marland Kitchen HYDROcodone-homatropine (HYCODAN) 5-1.5 MG/5ML syrup   Oral   Take 5 mLs by mouth every 8 (eight) hours as needed for cough.   150 mL   0   . ibuprofen (ADVIL,MOTRIN) 200 MG tablet   Oral   Take 4 tablets (800 mg total) by mouth every 6 (six) hours as needed for pain. For pain   30 tablet   0   . lamoTRIgine (LAMICTAL) 100 MG tablet   Oral   Take 1 tablet (100 mg total) by mouth 2 (two) times daily.   60 tablet   12   . lamoTRIgine (LAMICTAL) 25 MG tablet      One po bid xone week, then 2 tabs bid xone week, then 3 tabs bid x one week . Then new Rx 100mg  bid.   200 tablet   0   . levofloxacin (LEVAQUIN) 750 MG tablet   Oral   Take 1 tablet (750 mg total) by mouth daily.   10 tablet   0   . predniSONE (DELTASONE) 20 MG  tablet      Take 3 daily for 2 days, then 2 daily for 2 days, then 1 daily for 2 days.   12 tablet   0   . propranolol ER (INDERAL LA) 80 MG 24 hr capsule   Oral   Take 80 mg by mouth daily.         . ranitidine (ZANTAC) 150 MG tablet   Oral   Take 1 tablet (150 mg total) by mouth at bedtime.   30 tablet   11   . zolmitriptan (ZOMIG) 5 MG tablet   Oral   Take 1 tablet (5 mg total) by mouth as needed for migraine. May repeat one in 2 hours, maximum 2 tabs in 24 hours.   10 tablet   0    BP 126/65  Pulse 92  Temp(Src) 98.5 F (36.9 C) (Oral)  Resp 18  SpO2 97%  LMP 06/14/2013 Physical Exam  Nursing note and vitals reviewed. Constitutional: She is oriented to person, place, and time. She appears well-developed and well-nourished.  HENT:  Head: Normocephalic.  Eyes: Conjunctivae and EOM are normal.  Neck: Neck supple.  Cardiovascular: Normal rate and regular rhythm.   Pulmonary/Chest: Effort normal and breath sounds normal.  Musculoskeletal: Normal  range of motion.  Neurological: She is alert and oriented to person, place, and time.  Skin: Skin is warm and dry.  Psychiatric: She has a normal mood and affect.    ED Course  Procedures (including critical care time) Labs Review Labs Reviewed - No data to display Imaging Review No results found.  MDM   1. Headache    Pt is feeling slightly better but thinks she can sleep at home:headache consistent with previous headaches:doubt any acute symptoms    Teressa Lower, NP 07/20/13 1816

## 2013-07-20 NOTE — ED Notes (Signed)
Pt amb to triage with quick steady gait in nad. Pt reports her usual migraine sx onset Friday.

## 2013-07-20 NOTE — ED Provider Notes (Signed)
Medical screening examination/treatment/procedure(s) were performed by non-physician practitioner and as supervising physician I was immediately available for consultation/collaboration.   Richardean Canal, MD 07/20/13 [phone]

## 2013-07-30 ENCOUNTER — Other Ambulatory Visit: Payer: Self-pay | Admitting: Physician Assistant

## 2013-07-30 NOTE — Telephone Encounter (Signed)
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  No controlled substances will be refilled.

## 2013-09-25 ENCOUNTER — Emergency Department (HOSPITAL_BASED_OUTPATIENT_CLINIC_OR_DEPARTMENT_OTHER)
Admission: EM | Admit: 2013-09-25 | Discharge: 2013-09-25 | Disposition: A | Discharge status: Home / Self Care | Payer: Medicaid Other | Attending: Emergency Medicine | Admitting: Emergency Medicine

## 2013-09-25 ENCOUNTER — Encounter (HOSPITAL_BASED_OUTPATIENT_CLINIC_OR_DEPARTMENT_OTHER): Payer: Self-pay | Admitting: Emergency Medicine

## 2013-09-25 DIAGNOSIS — F410 Panic disorder [episodic paroxysmal anxiety] without agoraphobia: Secondary | ICD-10-CM | POA: Insufficient documentation

## 2013-09-25 DIAGNOSIS — F17200 Nicotine dependence, unspecified, uncomplicated: Secondary | ICD-10-CM | POA: Insufficient documentation

## 2013-09-25 DIAGNOSIS — G43909 Migraine, unspecified, not intractable, without status migrainosus: Secondary | ICD-10-CM | POA: Insufficient documentation

## 2013-09-25 DIAGNOSIS — K219 Gastro-esophageal reflux disease without esophagitis: Secondary | ICD-10-CM | POA: Insufficient documentation

## 2013-09-25 DIAGNOSIS — Z880 Allergy status to penicillin: Secondary | ICD-10-CM | POA: Insufficient documentation

## 2013-09-25 DIAGNOSIS — F329 Major depressive disorder, single episode, unspecified: Secondary | ICD-10-CM | POA: Insufficient documentation

## 2013-09-25 DIAGNOSIS — Z792 Long term (current) use of antibiotics: Secondary | ICD-10-CM | POA: Insufficient documentation

## 2013-09-25 DIAGNOSIS — F3289 Other specified depressive episodes: Secondary | ICD-10-CM | POA: Insufficient documentation

## 2013-09-25 DIAGNOSIS — Z8639 Personal history of other endocrine, nutritional and metabolic disease: Secondary | ICD-10-CM | POA: Insufficient documentation

## 2013-09-25 DIAGNOSIS — Z79899 Other long term (current) drug therapy: Secondary | ICD-10-CM | POA: Insufficient documentation

## 2013-09-25 DIAGNOSIS — IMO0002 Reserved for concepts with insufficient information to code with codable children: Secondary | ICD-10-CM | POA: Insufficient documentation

## 2013-09-25 DIAGNOSIS — Z862 Personal history of diseases of the blood and blood-forming organs and certain disorders involving the immune mechanism: Secondary | ICD-10-CM | POA: Insufficient documentation

## 2013-09-25 DIAGNOSIS — G43901 Migraine, unspecified, not intractable, with status migrainosus: Secondary | ICD-10-CM

## 2013-09-25 MED ORDER — IBUPROFEN 600 MG PO TABS: 600.0000 mg | ORAL_TABLET | Freq: Four times a day (QID) | ORAL | Status: DC | PRN

## 2013-09-25 MED ORDER — KETOROLAC TROMETHAMINE 30 MG/ML IJ SOLN
30.0000 mg | Freq: Once | INTRAMUSCULAR | Status: AC
  Administered 2013-09-25: 30 mg via INTRAVENOUS
  Filled 2013-09-25: qty 1

## 2013-09-25 MED ORDER — HYDROMORPHONE HCL PF 1 MG/ML IJ SOLN
1.0000 mg | Freq: Once | INTRAMUSCULAR | Status: AC
  Administered 2013-09-25: 1 mg via INTRAVENOUS
  Filled 2013-09-25: qty 1

## 2013-09-25 MED ORDER — ONDANSETRON 8 MG PO TBDP: 8.0000 mg | ORAL_TABLET | Freq: Three times a day (TID) | ORAL | Status: DC | PRN

## 2013-09-25 MED ORDER — SODIUM CHLORIDE 0.9 % IV BOLUS (SEPSIS)
1000.0000 mL | Freq: Once | INTRAVENOUS | Status: AC
  Administered 2013-09-25: 1000 mL via INTRAVENOUS

## 2013-09-25 MED ORDER — METOCLOPRAMIDE HCL 5 MG/ML IJ SOLN
10.0000 mg | Freq: Once | INTRAMUSCULAR | Status: AC
  Administered 2013-09-25: 10 mg via INTRAVENOUS
  Filled 2013-09-25: qty 2

## 2013-09-25 MED ORDER — HYDROCODONE-ACETAMINOPHEN 5-325 MG PO TABS: 1.0000 | ORAL_TABLET | Freq: Four times a day (QID) | ORAL | Status: DC | PRN

## 2013-09-25 MED ORDER — DEXAMETHASONE SODIUM PHOSPHATE 10 MG/ML IJ SOLN
10.0000 mg | Freq: Once | INTRAMUSCULAR | Status: AC
  Administered 2013-09-25: 10 mg via INTRAVENOUS
  Filled 2013-09-25: qty 1

## 2013-09-25 MED ORDER — DIPHENHYDRAMINE HCL 50 MG/ML IJ SOLN
25.0000 mg | Freq: Once | INTRAMUSCULAR | Status: AC
  Administered 2013-09-25: 25 mg via INTRAVENOUS
  Filled 2013-09-25: qty 1

## 2013-09-25 MED ORDER — DIAZEPAM 2 MG PO TABS
2.0000 mg | ORAL_TABLET | Freq: Once | ORAL | Status: AC
  Administered 2013-09-25: 2 mg via ORAL
  Filled 2013-09-25: qty 1

## 2013-09-25 NOTE — ED Notes (Signed)
Pt sts "this cocktail doesn't usually work for me".

## 2013-09-25 NOTE — ED Notes (Signed)
Pt c/o migraine x 5 days with h/o same. Pt c/o n/v. Pt sts normal meds are not working.

## 2013-09-25 NOTE — ED Provider Notes (Signed)
CSN: [id number]     Arrival date & time 09/25/13  0741 History   First MD Initiated Contact with Patient 09/25/13 0802     Chief Complaint  Patient presents with  . Migraine   (Consider location/radiation/quality/duration/timing/severity/associated sxs/prior Treatment) HPI Comments: SUBJECTIVE:  Cassidy Miles is a 47 y.o. female who complains of migraine headache for 5 day(s). She has a well established history of recurrent migraines.  Description of pain: throbbing pain, unilateral in the right frontal area, unilateral in the right temporal area. Associated symptoms: nausea. Patient has already taken triptans for this headache without relief. No visual complains, seizures, altered mental status, loss of consciousness, new weakness, or numbness, no gait instability. Headaches are typical of her migraines.   Current Facility-Administered Medications: diphenhydrAMINE (BENADRYL) injection 25 mg, 25 mg, Intravenous, Once, Neah Sporrer, MD ketorolac (TORADOL) 30 MG/ML injection 30 mg, 30 mg, Intravenous, Once, Kriya Westra, MD metoCLOPramide (REGLAN) injection 10 mg, 10 mg, Intravenous, Once, Jakeya Gherardi, MD sodium chloride 0.9 % bolus 1,000 mL, 1,000 mL, Intravenous, Once, Derwood Kaplan, MD  Current Outpatient Prescriptions: cetirizine (ZYRTEC) 10 MG tablet, Take 1 tablet (10 mg total) by mouth daily., Disp: 30 tablet, Rfl: 11 clonazePAM (KLONOPIN) 0.5 MG tablet, Take 1 tablet (0.5 mg total) by mouth daily as needed for anxiety., Disp: 30 tablet, Rfl: 0 FLUoxetine (PROZAC) 40 MG capsule, Take 40 mg by mouth daily., Disp: , Rfl:   gabapentin (NEURONTIN) 100 MG capsule, Take 100 mg by mouth 4 (four) times daily., Disp: , Rfl:  HYDROcodone-homatropine (HYCODAN) 5-1.5 MG/5ML syrup, Take 5 mLs by mouth every 8 (eight) hours as needed for cough., Disp: 150 mL, Rfl: 0 ibuprofen (ADVIL,MOTRIN) 200 MG tablet, Take 4 tablets (800 mg total) by mouth every 6 (six) hours as needed for pain. For  pain, Disp: 30 tablet, Rfl: 0 lamoTRIgine (LAMICTAL) 100 MG tablet, Take 1 tablet (100 mg total) by mouth 2 (two) times daily., Disp: 60 tablet, Rfl: 12 lamoTRIgine (LAMICTAL) 25 MG tablet, One po bid xone week, then 2 tabs bid xone week, then 3 tabs bid x one week . Then new Rx 100mg  bid., Disp: 200 tablet, Rfl: 0 levofloxacin (LEVAQUIN) 750 MG tablet, Take 1 tablet (750 mg total) by mouth daily., Disp: 10 tablet, Rfl: 0 predniSONE (DELTASONE) 20 MG tablet, Take 3 daily for 2 days, then 2 daily for 2 days, then 1 daily for 2 days., Disp: 12 tablet, Rfl: 0 propranolol ER (INDERAL LA) 80 MG 24 hr capsule, Take 80 mg by mouth daily., Disp: , Rfl:  ranitidine (ZANTAC) 150 MG tablet, Take 1 tablet (150 mg total) by mouth at bedtime., Disp: 30 tablet, Rfl: 11 zolmitriptan (ZOMIG) 5 MG tablet, Take 1 tablet (5 mg total) by mouth as needed for migraine. May repeat one in 2 hours, maximum 2 tabs in 24 hours., Disp: 10 tablet, Rfl: 0    There are no associated abnormal neurological symptoms such as TIA's, loss of balance, loss of vision or speech, numbness or weakness on review. Past neurological history: negative for stroke, MS, epilepsy, or brain tumor.   OBJECTIVE:  Patient appears in pain, preferring to lie in a darkened room. Her vitals are normal. Alert and oriented x 3.  Ears and throat normal. Neck fully supple without nodes. Sinuses non tender. Cranial nerves are normal.  Fundi are normal with sharp disc margins, Mental status normal. Cerebellar function normal.   ASSESSMENT:  Migraine headache  PLAN:  Treatment today -  see orders as documented  in the electronic medical record. ROV prn if pain does not resolve after treatment.   Patient is a 47 y.o. female presenting with migraines. The history is provided by the patient and medical records.  Migraine Associated symptoms include headaches. Pertinent negatives include no chest pain, no abdominal pain and no shortness of breath.    Past  Medical History  Diagnosis Date  . GERD (gastroesophageal reflux disease)   . IBS (irritable bowel syndrome)   . Migraines   . Hypercholesteremia   . Anxiety and depression   . Panic attack    History reviewed. No pertinent past surgical history. Family History  Problem Relation Age of Onset  . Alzheimer's disease Mother    History  Substance Use Topics  . Smoking status: Current Some Day Smoker    Types: Cigarettes  . Smokeless tobacco: Not on file  . Alcohol Use: 0.0 oz/week   OB History   Grav Para Term Preterm Abortions TAB SAB Ect Mult Living                 Review of Systems  Constitutional: Positive for activity change.  Respiratory: Negative for shortness of breath.   Cardiovascular: Negative for chest pain.  Gastrointestinal: Positive for nausea. Negative for vomiting and abdominal pain.  Genitourinary: Negative for dysuria.  Musculoskeletal: Negative for neck pain.  Neurological: Positive for headaches. Negative for weakness and numbness.    Allergies  Cephalexin; Clarithromycin; Penicillins; and Tramadol  Home Medications   Current Outpatient Rx  Name  Route  Sig  Dispense  Refill  . cetirizine (ZYRTEC) 10 MG tablet   Oral   Take 1 tablet (10 mg total) by mouth daily.   30 tablet   11   . clonazePAM (KLONOPIN) 0.5 MG tablet   Oral   Take 1 tablet (0.5 mg total) by mouth daily as needed for anxiety.   30 tablet   0   . FLUoxetine (PROZAC) 40 MG capsule   Oral   Take 40 mg by mouth daily.         Marland Kitchen gabapentin (NEURONTIN) 100 MG capsule   Oral   Take 100 mg by mouth 4 (four) times daily.         Marland Kitchen HYDROcodone-homatropine (HYCODAN) 5-1.5 MG/5ML syrup   Oral   Take 5 mLs by mouth every 8 (eight) hours as needed for cough.   150 mL   0   . ibuprofen (ADVIL,MOTRIN) 200 MG tablet   Oral   Take 4 tablets (800 mg total) by mouth every 6 (six) hours as needed for pain. For pain   30 tablet   0   . lamoTRIgine (LAMICTAL) 100 MG tablet    Oral   Take 1 tablet (100 mg total) by mouth 2 (two) times daily.   60 tablet   12   . lamoTRIgine (LAMICTAL) 25 MG tablet      One po bid xone week, then 2 tabs bid xone week, then 3 tabs bid x one week . Then new Rx 100mg  bid.   200 tablet   0   . levofloxacin (LEVAQUIN) 750 MG tablet   Oral   Take 1 tablet (750 mg total) by mouth daily.   10 tablet   0   . predniSONE (DELTASONE) 20 MG tablet      Take 3 daily for 2 days, then 2 daily for 2 days, then 1 daily for 2 days.   12 tablet   0   .  propranolol ER (INDERAL LA) 80 MG 24 hr capsule   Oral   Take 80 mg by mouth daily.         . ranitidine (ZANTAC) 150 MG tablet   Oral   Take 1 tablet (150 mg total) by mouth at bedtime.   30 tablet   11   . zolmitriptan (ZOMIG) 5 MG tablet   Oral   Take 1 tablet (5 mg total) by mouth as needed for migraine. May repeat one in 2 hours, maximum 2 tabs in 24 hours.   10 tablet   0    BP 144/84  Pulse 108  Temp(Src) 97.9 F (36.6 C) (Oral)  Resp 20  Ht 5\' 4"  (1.626 m)  Wt 145 lb (65.772 kg)  BMI 24.88 kg/m2  SpO2 99%  LMP 09/11/2013 Physical Exam  Constitutional: She is oriented to person, place, and time. She appears well-developed and well-nourished.  HENT:  Head: Normocephalic and atraumatic.  Eyes: EOM are normal. Pupils are equal, round, and reactive to light.  Neck: Neck supple.  Cardiovascular: Normal rate, regular rhythm and normal heart sounds.   No murmur heard. Pulmonary/Chest: Effort normal. No respiratory distress.  Abdominal: Soft. She exhibits no distension. There is no tenderness. There is no rebound and no guarding.  Neurological: She is alert and oriented to person, place, and time. No cranial nerve deficit. Coordination normal.  Skin: Skin is warm and dry.    ED Course  Procedures (including critical care time) Labs Review Labs Reviewed - No data to display Imaging Review No results found.  EKG Interpretation   None       MDM  No  diagnosis found.  DDX includes: Primary headaches - including migrainous headaches, cluster headaches, tension headaches. ICH Carotid dissection Cavernous sinus thrombosis Meningitis Encephalitis Sinusitis Tumor Vascular headaches AV malformation Brain aneurysm Muscular headaches  A/P: Pt comes in with cc of headaches. No concerns for life threatening secondary headaches because of hx of migraines, and similar headaches in the past - and her current sx being essentially the same as always. Will try to break the headache in the ED with headache cocktail.   Derwood Kaplan, MD 09/25/13 [phone]

## 2013-09-25 NOTE — ED Notes (Signed)
Unable to obtain IV access.

## 2013-10-25 ENCOUNTER — Encounter (HOSPITAL_BASED_OUTPATIENT_CLINIC_OR_DEPARTMENT_OTHER): Payer: Self-pay | Admitting: Emergency Medicine

## 2013-10-25 ENCOUNTER — Emergency Department (HOSPITAL_BASED_OUTPATIENT_CLINIC_OR_DEPARTMENT_OTHER)
Admission: EM | Admit: 2013-10-25 | Discharge: 2013-10-25 | Disposition: A | Discharge status: Home / Self Care | Payer: Medicaid Other | Attending: Emergency Medicine | Admitting: Emergency Medicine

## 2013-10-25 DIAGNOSIS — F329 Major depressive disorder, single episode, unspecified: Secondary | ICD-10-CM | POA: Insufficient documentation

## 2013-10-25 DIAGNOSIS — Z880 Allergy status to penicillin: Secondary | ICD-10-CM | POA: Insufficient documentation

## 2013-10-25 DIAGNOSIS — Z8639 Personal history of other endocrine, nutritional and metabolic disease: Secondary | ICD-10-CM | POA: Insufficient documentation

## 2013-10-25 DIAGNOSIS — Z862 Personal history of diseases of the blood and blood-forming organs and certain disorders involving the immune mechanism: Secondary | ICD-10-CM | POA: Insufficient documentation

## 2013-10-25 DIAGNOSIS — IMO0002 Reserved for concepts with insufficient information to code with codable children: Secondary | ICD-10-CM | POA: Insufficient documentation

## 2013-10-25 DIAGNOSIS — G43909 Migraine, unspecified, not intractable, without status migrainosus: Secondary | ICD-10-CM

## 2013-10-25 DIAGNOSIS — F3289 Other specified depressive episodes: Secondary | ICD-10-CM | POA: Insufficient documentation

## 2013-10-25 DIAGNOSIS — F411 Generalized anxiety disorder: Secondary | ICD-10-CM | POA: Insufficient documentation

## 2013-10-25 DIAGNOSIS — Z79899 Other long term (current) drug therapy: Secondary | ICD-10-CM | POA: Insufficient documentation

## 2013-10-25 DIAGNOSIS — K219 Gastro-esophageal reflux disease without esophagitis: Secondary | ICD-10-CM | POA: Insufficient documentation

## 2013-10-25 DIAGNOSIS — F17200 Nicotine dependence, unspecified, uncomplicated: Secondary | ICD-10-CM | POA: Insufficient documentation

## 2013-10-25 DIAGNOSIS — R112 Nausea with vomiting, unspecified: Secondary | ICD-10-CM | POA: Insufficient documentation

## 2013-10-25 MED ORDER — DIPHENHYDRAMINE HCL 50 MG/ML IJ SOLN
25.0000 mg | Freq: Once | INTRAMUSCULAR | Status: AC
  Administered 2013-10-25: 25 mg via INTRAVENOUS
  Filled 2013-10-25: qty 1

## 2013-10-25 MED ORDER — DEXAMETHASONE SODIUM PHOSPHATE 10 MG/ML IJ SOLN
10.0000 mg | Freq: Once | INTRAMUSCULAR | Status: AC
  Administered 2013-10-25: 10 mg via INTRAVENOUS
  Filled 2013-10-25: qty 1

## 2013-10-25 MED ORDER — KETOROLAC TROMETHAMINE 30 MG/ML IJ SOLN
30.0000 mg | Freq: Once | INTRAMUSCULAR | Status: AC
  Administered 2013-10-25: 30 mg via INTRAVENOUS
  Filled 2013-10-25: qty 1

## 2013-10-25 MED ORDER — VALPROATE SODIUM 500 MG/5ML IV SOLN
INTRAVENOUS | Status: AC
  Administered 2013-10-25: 500 mg
  Filled 2013-10-25: qty 5

## 2013-10-25 MED ORDER — SODIUM CHLORIDE 0.9 % IV BOLUS (SEPSIS)
500.0000 mL | Freq: Once | INTRAVENOUS | Status: AC
  Administered 2013-10-25: 500 mL via INTRAVENOUS

## 2013-10-25 MED ORDER — METOCLOPRAMIDE HCL 5 MG/ML IJ SOLN
10.0000 mg | Freq: Once | INTRAMUSCULAR | Status: AC
  Administered 2013-10-25: 10 mg via INTRAVENOUS
  Filled 2013-10-25: qty 2

## 2013-10-25 MED ORDER — VALPROATE SODIUM 500 MG/5ML IV SOLN: 500.0000 mg | Freq: Once | INTRAVENOUS | Status: AC

## 2013-10-25 NOTE — ED Notes (Signed)
Dose and rate confirmed by Homero Fellers, pharmacist at [facility].

## 2013-10-25 NOTE — ED Notes (Signed)
Pt observed walking down hallway out of dept before being discharged. Pt's IV catheter was found on stretcher. Pt took IV out herself. Pt did not receive written or verbal discharge instructions from this nurse.

## 2013-10-25 NOTE — ED Notes (Signed)
Headache x 4 days. Light sensitive and vomiting.

## 2013-10-25 NOTE — ED Provider Notes (Signed)
Medical screening examination/treatment/procedure(s) were performed by non-physician practitioner and as supervising physician I was immediately available for consultation/collaboration.  EKG Interpretation   None        Juliet Rude. Rubin Payor, MD 10/25/13 2256

## 2013-10-25 NOTE — ED Notes (Signed)
Patient refuses offer for fluid challenge, RN made aware.

## 2013-10-25 NOTE — ED Provider Notes (Signed)
CSN: [id number]     Arrival date & time 10/25/13  1602 History   First MD Initiated Contact with Patient 10/25/13 1604     Chief Complaint  Patient presents with  . Migraine   (Consider location/radiation/quality/duration/timing/severity/associated sxs/prior Treatment) HPI Comments: Pt is a 47 y/o female with a PMHx of migraines who presents to the ED complaining of gradual onset constant headache x 4 days. Headache is the same as her "typical migraines" which she gets "all the time, really bad once per month", described as throbbing and severe. Admits to associated photophobia, phonophobia, nausea and vomiting. She tried taking ibuprofen and zomig at home without relief. Denies fever, chills, abdominal pain, neck pain or rashes.  Patient is a 47 y.o. female presenting with migraines. The history is provided by the patient.  Migraine Associated symptoms include headaches.    Past Medical History  Diagnosis Date  . GERD (gastroesophageal reflux disease)   . IBS (irritable bowel syndrome)   . Migraines   . Hypercholesteremia   . Anxiety and depression   . Panic attack    History reviewed. No pertinent past surgical history. Family History  Problem Relation Age of Onset  . Alzheimer's disease Mother    History  Substance Use Topics  . Smoking status: Current Some Day Smoker    Types: Cigarettes  . Smokeless tobacco: Not on file  . Alcohol Use: 0.0 oz/week   OB History   Grav Para Term Preterm Abortions TAB SAB Ect Mult Living                 Review of Systems  Eyes: Positive for photophobia.  Neurological: Positive for headaches.  All other systems reviewed and are negative.    Allergies  Cephalexin; Clarithromycin; Penicillins; and Tramadol  Home Medications   Current Outpatient Rx  Name  Route  Sig  Dispense  Refill  . cetirizine (ZYRTEC) 10 MG tablet   Oral   Take 1 tablet (10 mg total) by mouth daily.   30 tablet   11   . clonazePAM (KLONOPIN) 0.5 MG  tablet   Oral   Take 1 tablet (0.5 mg total) by mouth daily as needed for anxiety.   30 tablet   0   . FLUoxetine (PROZAC) 40 MG capsule   Oral   Take 40 mg by mouth daily.         Marland Kitchen gabapentin (NEURONTIN) 100 MG capsule   Oral   Take 100 mg by mouth 4 (four) times daily.         Marland Kitchen HYDROcodone-acetaminophen (NORCO/VICODIN) 5-325 MG per tablet   Oral   Take 1 tablet by mouth every 6 (six) hours as needed.   6 tablet   0   . HYDROcodone-homatropine (HYCODAN) 5-1.5 MG/5ML syrup   Oral   Take 5 mLs by mouth every 8 (eight) hours as needed for cough.   150 mL   0   . ibuprofen (ADVIL,MOTRIN) 200 MG tablet   Oral   Take 4 tablets (800 mg total) by mouth every 6 (six) hours as needed for pain. For pain   30 tablet   0   . ibuprofen (ADVIL,MOTRIN) 600 MG tablet   Oral   Take 1 tablet (600 mg total) by mouth every 6 (six) hours as needed.   30 tablet   0   . lamoTRIgine (LAMICTAL) 100 MG tablet   Oral   Take 1 tablet (100 mg total) by mouth 2 (two) times daily.  60 tablet   12   . lamoTRIgine (LAMICTAL) 25 MG tablet      One po bid xone week, then 2 tabs bid xone week, then 3 tabs bid x one week . Then new Rx 100mg  bid.   200 tablet   0   . levofloxacin (LEVAQUIN) 750 MG tablet   Oral   Take 1 tablet (750 mg total) by mouth daily.   10 tablet   0   . ondansetron (ZOFRAN ODT) 8 MG disintegrating tablet   Oral   Take 1 tablet (8 mg total) by mouth every 8 (eight) hours as needed for nausea.   20 tablet   0   . predniSONE (DELTASONE) 20 MG tablet      Take 3 daily for 2 days, then 2 daily for 2 days, then 1 daily for 2 days.   12 tablet   0   . propranolol ER (INDERAL LA) 80 MG 24 hr capsule   Oral   Take 80 mg by mouth daily.         . ranitidine (ZANTAC) 150 MG tablet   Oral   Take 1 tablet (150 mg total) by mouth at bedtime.   30 tablet   11   . zolmitriptan (ZOMIG) 5 MG tablet   Oral   Take 1 tablet (5 mg total) by mouth as needed for  migraine. May repeat one in 2 hours, maximum 2 tabs in 24 hours.   10 tablet   0    BP 163/89  Pulse 56  Temp(Src) 98.4 F (36.9 C) (Oral)  Resp 18  Ht 5\' 5"  (1.651 m)  Wt 145 lb (65.772 kg)  BMI 24.13 kg/m2  SpO2 100%  LMP 09/11/2013 Physical Exam  Nursing note and vitals reviewed. Constitutional: She is oriented to person, place, and time. She appears well-developed and well-nourished. No distress.  HENT:  Head: Normocephalic and atraumatic.  Mouth/Throat: Oropharynx is clear and moist.  Eyes: Conjunctivae and EOM are normal. Pupils are equal, round, and reactive to light.  Neck: Normal range of motion. Neck supple.  Cardiovascular: Normal rate, regular rhythm and normal heart sounds.   Pulmonary/Chest: Effort normal and breath sounds normal.  Abdominal: Soft. Bowel sounds are normal. There is no tenderness.  Musculoskeletal: Normal range of motion. She exhibits no edema.  Neurological: She is alert and oriented to person, place, and time. She has normal strength. No cranial nerve deficit or sensory deficit. She displays a negative Romberg sign. Coordination normal.  Skin: Skin is warm and dry. She is not diaphoretic.  Psychiatric: She has a normal mood and affect. Her behavior is normal.    ED Course  Procedures (including critical care time) Labs Review Labs Reviewed - No data to display Imaging Review No results found.  EKG Interpretation   None       MDM   1. Migraine     Pt presenting with headache, similar to her "typical migraines". No red flags concerning patient's headache. Afebrile, well appearing, NAD, normal VS. No focal neuro deficits. Will give toradol, decadron, reglan, benadryl. 5:20 PM Pt reports no improvement with above medication regimen. On chart review, she receives a dose of dilaudid almost each time she presents to the ED, and then her symptoms resolve. It is noted from April from her neurologist Dr. Terrace Arabia taht narcotic medications are not  appropriate treatment for migraines. Plan to give 500 mg depakote. 6:07 PM Pt reports no improvement of her headache. She is  well appearing and in NAD, vitals remain stable. States she usually receives a different medication. I discussed that narcotics are not appropriate treatment for migraines. I offered admission for migraine control, she replies "why would I get admitted, I will go home, that is fine". She is stable for discharge. F/u with PCP and neurologist. Return precautions given. Patient states understanding of treatment care plan and is agreeable.   Trevor Mace, PA-C 10/25/13 1610

## 2014-02-14 ENCOUNTER — Encounter (HOSPITAL_BASED_OUTPATIENT_CLINIC_OR_DEPARTMENT_OTHER): Payer: Self-pay | Admitting: Emergency Medicine

## 2014-02-14 ENCOUNTER — Emergency Department (HOSPITAL_BASED_OUTPATIENT_CLINIC_OR_DEPARTMENT_OTHER)
Admission: EM | Admit: 2014-02-14 | Discharge: 2014-02-14 | Disposition: A | Discharge status: Home / Self Care | Payer: Medicaid Other | Attending: Emergency Medicine | Admitting: Emergency Medicine

## 2014-02-14 DIAGNOSIS — Z862 Personal history of diseases of the blood and blood-forming organs and certain disorders involving the immune mechanism: Secondary | ICD-10-CM | POA: Insufficient documentation

## 2014-02-14 DIAGNOSIS — R519 Headache, unspecified: Secondary | ICD-10-CM

## 2014-02-14 DIAGNOSIS — K219 Gastro-esophageal reflux disease without esophagitis: Secondary | ICD-10-CM | POA: Insufficient documentation

## 2014-02-14 DIAGNOSIS — R51 Headache: Secondary | ICD-10-CM

## 2014-02-14 DIAGNOSIS — F3289 Other specified depressive episodes: Secondary | ICD-10-CM | POA: Insufficient documentation

## 2014-02-14 DIAGNOSIS — F17200 Nicotine dependence, unspecified, uncomplicated: Secondary | ICD-10-CM | POA: Insufficient documentation

## 2014-02-14 DIAGNOSIS — G43909 Migraine, unspecified, not intractable, without status migrainosus: Secondary | ICD-10-CM | POA: Insufficient documentation

## 2014-02-14 DIAGNOSIS — F410 Panic disorder [episodic paroxysmal anxiety] without agoraphobia: Secondary | ICD-10-CM | POA: Insufficient documentation

## 2014-02-14 DIAGNOSIS — Z880 Allergy status to penicillin: Secondary | ICD-10-CM | POA: Insufficient documentation

## 2014-02-14 DIAGNOSIS — Z8639 Personal history of other endocrine, nutritional and metabolic disease: Secondary | ICD-10-CM | POA: Insufficient documentation

## 2014-02-14 DIAGNOSIS — F329 Major depressive disorder, single episode, unspecified: Secondary | ICD-10-CM | POA: Insufficient documentation

## 2014-02-14 DIAGNOSIS — Z79899 Other long term (current) drug therapy: Secondary | ICD-10-CM | POA: Insufficient documentation

## 2014-02-14 MED ORDER — VALPROATE SODIUM 500 MG/5ML IV SOLN
INTRAVENOUS | Status: AC
  Filled 2014-02-14: qty 5

## 2014-02-14 MED ORDER — DIPHENHYDRAMINE HCL 50 MG/ML IJ SOLN
25.0000 mg | Freq: Once | INTRAMUSCULAR | Status: AC
  Administered 2014-02-14: 25 mg via INTRAVENOUS
  Filled 2014-02-14: qty 1

## 2014-02-14 MED ORDER — SODIUM CHLORIDE 0.9 % IV BOLUS (SEPSIS)
1000.0000 mL | Freq: Once | INTRAVENOUS | Status: AC
  Administered 2014-02-14: 1000 mL via INTRAVENOUS

## 2014-02-14 MED ORDER — VALPROATE SODIUM 500 MG/5ML IV SOLN
500.0000 mg | Freq: Once | INTRAVENOUS | Status: AC
  Administered 2014-02-14: 500 mg via INTRAVENOUS

## 2014-02-14 MED ORDER — DEXAMETHASONE SODIUM PHOSPHATE 10 MG/ML IJ SOLN
10.0000 mg | Freq: Once | INTRAMUSCULAR | Status: AC
  Administered 2014-02-14: 10 mg via INTRAVENOUS
  Filled 2014-02-14: qty 1

## 2014-02-14 MED ORDER — KETOROLAC TROMETHAMINE 30 MG/ML IJ SOLN
30.0000 mg | Freq: Once | INTRAMUSCULAR | Status: AC
  Administered 2014-02-14: 30 mg via INTRAVENOUS
  Filled 2014-02-14: qty 1

## 2014-02-14 MED ORDER — HYDROMORPHONE HCL PF 1 MG/ML IJ SOLN
1.0000 mg | Freq: Once | INTRAMUSCULAR | Status: AC
  Administered 2014-02-14: 1 mg via INTRAVENOUS
  Filled 2014-02-14: qty 1

## 2014-02-14 MED ORDER — METOCLOPRAMIDE HCL 5 MG/ML IJ SOLN
10.0000 mg | Freq: Once | INTRAMUSCULAR | Status: AC
  Administered 2014-02-14: 10 mg via INTRAVENOUS
  Filled 2014-02-14: qty 2

## 2014-02-14 MED ORDER — ONDANSETRON HCL 4 MG/2ML IJ SOLN
4.0000 mg | Freq: Once | INTRAMUSCULAR | Status: AC
  Administered 2014-02-14: 4 mg via INTRAVENOUS
  Filled 2014-02-14: qty 2

## 2014-02-14 NOTE — ED Notes (Signed)
Pt reports ha worse following depacon initiation.  Requesting it to be stopped.  Stopped med per request and md notified pt requesting something additional for pain.

## 2014-02-14 NOTE — ED Notes (Signed)
MD at bedside. 

## 2014-02-14 NOTE — ED Notes (Signed)
Soda and crackers offered for po challenge.

## 2014-02-14 NOTE — ED Provider Notes (Signed)
CSN: [id number]     Arrival date & time 02/14/14  1546 History  This chart was scribed for [name], MD by Danella Maiers, ED Scribe. This patient was seen in room MH10/MH10 and the patient's care was started at 3:58 PM.    Chief Complaint  Patient presents with  . Migraine   The history is provided by the patient. No language interpreter was used.   HPI Comments: Cassidy Miles is a 48 y.o. female with a h/o migraines who presents to the Emergency Department complaining of a gradual-onset migraine with associated aura and photophobia onset 5 days ago. She reports associated nausea and vomiting onset 2 days. She describes the headache as sharp and throbbing. She states it feels like her usual migraines. She has tried immitrex and tylenol with no relief. She states these medications have worked for her in the past but not now. She went to Urgent Care last night for the same and was given a Toradol injection with no relief. She denies fevers, weakness, numbness, blurry or double vision.    Past Medical History  Diagnosis Date  . GERD (gastroesophageal reflux disease)   . IBS (irritable bowel syndrome)   . Migraines   . Hypercholesteremia   . Anxiety and depression   . Panic attack    History reviewed. No pertinent past surgical history. Family History  Problem Relation Age of Onset  . Alzheimer's disease Mother    History  Substance Use Topics  . Smoking status: Light Tobacco Smoker    Types: Cigarettes  . Smokeless tobacco: Not on file  . Alcohol Use: 0.0 oz/week     Comment: occ   OB History   Grav Para Term Preterm Abortions TAB SAB Ect Mult Living                 Review of Systems  Constitutional: Negative for fever.  Eyes: Positive for photophobia. Negative for visual disturbance.  Gastrointestinal: Positive for nausea and vomiting.  Neurological: Positive for headaches. Negative for weakness and numbness.   A complete 10 system review of systems was obtained  and all systems are negative except as noted in the HPI and PMH.     Allergies  Cephalexin; Clarithromycin; Penicillins; and Tramadol  Home Medications   Current Outpatient Rx  Name  Route  Sig  Dispense  Refill  . cetirizine (ZYRTEC) 10 MG tablet   Oral   Take 1 tablet (10 mg total) by mouth daily.   30 tablet   11   . clonazePAM (KLONOPIN) 0.5 MG tablet   Oral   Take 1 tablet (0.5 mg total) by mouth daily as needed for anxiety.   30 tablet   0   . FLUoxetine (PROZAC) 40 MG capsule   Oral   Take 40 mg by mouth daily.         Marland Kitchen gabapentin (NEURONTIN) 100 MG capsule   Oral   Take 100 mg by mouth 4 (four) times daily.         Marland Kitchen HYDROcodone-acetaminophen (NORCO/VICODIN) 5-325 MG per tablet   Oral   Take 1 tablet by mouth every 6 (six) hours as needed.   6 tablet   0   . HYDROcodone-homatropine (HYCODAN) 5-1.5 MG/5ML syrup   Oral   Take 5 mLs by mouth every 8 (eight) hours as needed for cough.   150 mL   0   . ibuprofen (ADVIL,MOTRIN) 200 MG tablet   Oral   Take 4 tablets (  800 mg total) by mouth every 6 (six) hours as needed for pain. For pain   30 tablet   0   . ibuprofen (ADVIL,MOTRIN) 600 MG tablet   Oral   Take 1 tablet (600 mg total) by mouth every 6 (six) hours as needed.   30 tablet   0   . lamoTRIgine (LAMICTAL) 100 MG tablet   Oral   Take 1 tablet (100 mg total) by mouth 2 (two) times daily.   60 tablet   12   . lamoTRIgine (LAMICTAL) 25 MG tablet      One po bid xone week, then 2 tabs bid xone week, then 3 tabs bid x one week . Then new Rx 100mg  bid.   200 tablet   0   . levofloxacin (LEVAQUIN) 750 MG tablet   Oral   Take 1 tablet (750 mg total) by mouth daily.   10 tablet   0   . ondansetron (ZOFRAN ODT) 8 MG disintegrating tablet   Oral   Take 1 tablet (8 mg total) by mouth every 8 (eight) hours as needed for nausea.   20 tablet   0   . predniSONE (DELTASONE) 20 MG tablet      Take 3 daily for 2 days, then 2 daily for 2 days,  then 1 daily for 2 days.   12 tablet   0   . propranolol ER (INDERAL LA) 80 MG 24 hr capsule   Oral   Take 80 mg by mouth daily.         . ranitidine (ZANTAC) 150 MG tablet   Oral   Take 1 tablet (150 mg total) by mouth at bedtime.   30 tablet   11   . zolmitriptan (ZOMIG) 5 MG tablet   Oral   Take 1 tablet (5 mg total) by mouth as needed for migraine. May repeat one in 2 hours, maximum 2 tabs in 24 hours.   10 tablet   0    BP 141/79  Pulse 98  Temp(Src) 98.3 F (36.8 C) (Oral)  Resp 18  Ht 5\' 4"  (1.626 m)  Wt 142 lb (64.411 kg)  BMI 24.36 kg/m2  SpO2 99% Physical Exam  Nursing note and vitals reviewed. Constitutional: She is oriented to person, place, and time. She appears well-developed and well-nourished. No distress.  HENT:  Head: Normocephalic and atraumatic.  No temporal artery tenderness  Eyes: EOM are normal.  Neck: Neck supple. No tracheal deviation present.  No meningismus  Cardiovascular: Normal rate.   Pulmonary/Chest: Effort normal. No respiratory distress.  Musculoskeletal: Normal range of motion.  CN 2-12 intact, no ataxia on finger to nose, no nystagmus, 5/5 strength throughout, no pronator drift, Romberg negative, normal gait.  Neurological: She is alert and oriented to person, place, and time.  Skin: Skin is warm and dry.  Psychiatric: She has a normal mood and affect. Her behavior is normal.    ED Course  Procedures (including critical care time) Medications  sodium chloride 0.9 % bolus 1,000 mL (0 mLs Intravenous Stopped 02/14/14 1843)  ketorolac (TORADOL) 30 MG/ML injection 30 mg (30 mg Intravenous Given 02/14/14 1641)  metoCLOPramide (REGLAN) injection 10 mg (10 mg Intravenous Given 02/14/14 1641)  diphenhydrAMINE (BENADRYL) injection 25 mg (25 mg Intravenous Given 02/14/14 1641)  dexamethasone (DECADRON) injection 10 mg (10 mg Intravenous Given 02/14/14 1641)  ondansetron (ZOFRAN) injection 4 mg (4 mg Intravenous Given 02/14/14 1641)   valproate (DEPACON) 500 mg in dextrose 5 %  50 mL IVPB (0 mg Intravenous Stopped 02/14/14 1738)  valproate (DEPACON) 500 MG/5ML injection (  Duplicate 02/14/14 1738)  HYDROmorphone (DILAUDID) injection 1 mg (1 mg Intravenous Given 02/14/14 1748)  DIAGNOSTIC STUDIES: Oxygen Saturation is 99% on RA, normal by my interpretation.    COORDINATION OF CARE: 4:21 PM- Discussed treatment plan with pt. Pt agrees to plan.    Labs Review Labs Reviewed - No data to display Imaging Review No results found.   EKG Interpretation None      MDM   Final diagnoses:  Headache   gradual onset headache similar to previous migraines x5 days. Associated with photophobia, nausea, vomiting. No fever. No focal weakness, numbness or tingling.  Nonfocal neurological exam. No thunderclap onset. No meningismus. No fever.  Do not suspect SAH or meningitis. Headache improved with above treatment. Patient requesting to go home. She has had no vomiting in the ED. She's tolerating by mouth. No focal weakness, numbness or tingling. She will be referred back to neurology for ongoing evaluation for headaches.    I personally performed the services described in this documentation, which was scribed in my presence. The recorded information has been reviewed and is accurate.   [name], MD 02/14/14 [phone]

## 2014-02-14 NOTE — ED Notes (Signed)
Pt reports migraine, photosensitivity and n/v for 2 days.

## 2014-02-14 NOTE — Discharge Instructions (Signed)
Migraine Headache A migraine headache is an intense, throbbing pain on one or both sides of your head. A migraine can last for 30 minutes to several hours. CAUSES  The exact cause of a migraine headache is not always known. However, a migraine may be caused when nerves in the brain become irritated and release chemicals that cause inflammation. This causes pain. Certain things may also trigger migraines, such as:  Alcohol.  Smoking.  Stress.  Menstruation.  Aged cheeses.  Foods or drinks that contain nitrates, glutamate, aspartame, or tyramine.  Lack of sleep.  Chocolate.  Caffeine.  Hunger.  Physical exertion.  Fatigue.  Medicines used to treat chest pain (nitroglycerine), birth control pills, estrogen, and some blood pressure medicines. SIGNS AND SYMPTOMS  Pain on one or both sides of your head.  Pulsating or throbbing pain.  Severe pain that prevents daily activities.  Pain that is aggravated by any physical activity.  Nausea, vomiting, or both.  Dizziness.  Pain with exposure to bright lights, loud noises, or activity.  General sensitivity to bright lights, loud noises, or smells. Before you get a migraine, you may get warning signs that a migraine is coming (aura). An aura may include:  Seeing flashing lights.  Seeing bright spots, halos, or zig-zag lines.  Having tunnel vision or blurred vision.  Having feelings of numbness or tingling.  Having trouble talking.  Having muscle weakness. DIAGNOSIS  A migraine headache is often diagnosed based on:  Symptoms.  Physical exam.  A CT scan or MRI of your head. These imaging tests cannot diagnose migraines, but they can help rule out other causes of headaches. TREATMENT Medicines may be given for pain and nausea. Medicines can also be given to help prevent recurrent migraines.  HOME CARE INSTRUCTIONS  Only take over-the-counter or prescription medicines for pain or discomfort as directed by your  health care provider. The use of long-term narcotics is not recommended.  Lie down in a dark, quiet room when you have a migraine.  Keep a journal to find out what may trigger your migraine headaches. For example, write down:  What you eat and drink.  How much sleep you get.  Any change to your diet or medicines.  Limit alcohol consumption.  Quit smoking if you smoke.  Get 7 9 hours of sleep, or as recommended by your health care provider.  Limit stress.  Keep lights dim if bright lights bother you and make your migraines worse. SEEK IMMEDIATE MEDICAL CARE IF:   Your migraine becomes severe.  You have a fever.  You have a stiff neck.  You have vision loss.  You have muscular weakness or loss of muscle control.  You start losing your balance or have trouble walking.  You feel faint or pass out.  You have severe symptoms that are different from your first symptoms. MAKE SURE YOU:   Understand these instructions.  Will watch your condition.  Will get help right away if you are not doing well or get worse. Document Released: 11/04/2005 Document Revised: 08/25/2013 Document Reviewed: 07/12/2013 Exit[employer] Patient Information 2014 [employer].  

## 2014-02-14 NOTE — ED Notes (Signed)
Lights dimmed.

## 2014-02-21 ENCOUNTER — Emergency Department (HOSPITAL_COMMUNITY)
Admission: EM | Admit: 2014-02-21 | Discharge: 2014-02-21 | Disposition: A | Discharge status: Home / Self Care | Payer: Medicaid Other | Attending: Emergency Medicine | Admitting: Emergency Medicine

## 2014-02-21 ENCOUNTER — Encounter (HOSPITAL_COMMUNITY): Payer: Self-pay | Admitting: Emergency Medicine

## 2014-02-21 DIAGNOSIS — G43909 Migraine, unspecified, not intractable, without status migrainosus: Secondary | ICD-10-CM | POA: Insufficient documentation

## 2014-02-21 DIAGNOSIS — R112 Nausea with vomiting, unspecified: Secondary | ICD-10-CM | POA: Insufficient documentation

## 2014-02-21 DIAGNOSIS — H53149 Visual discomfort, unspecified: Secondary | ICD-10-CM | POA: Insufficient documentation

## 2014-02-21 DIAGNOSIS — F410 Panic disorder [episodic paroxysmal anxiety] without agoraphobia: Secondary | ICD-10-CM | POA: Insufficient documentation

## 2014-02-21 DIAGNOSIS — K589 Irritable bowel syndrome without diarrhea: Secondary | ICD-10-CM | POA: Insufficient documentation

## 2014-02-21 DIAGNOSIS — E7800 Pure hypercholesterolemia, unspecified: Secondary | ICD-10-CM | POA: Insufficient documentation

## 2014-02-21 DIAGNOSIS — F17200 Nicotine dependence, unspecified, uncomplicated: Secondary | ICD-10-CM | POA: Insufficient documentation

## 2014-02-21 DIAGNOSIS — Z8679 Personal history of other diseases of the circulatory system: Secondary | ICD-10-CM | POA: Insufficient documentation

## 2014-02-21 DIAGNOSIS — Z880 Allergy status to penicillin: Secondary | ICD-10-CM | POA: Insufficient documentation

## 2014-02-21 DIAGNOSIS — F341 Dysthymic disorder: Secondary | ICD-10-CM | POA: Insufficient documentation

## 2014-02-21 DIAGNOSIS — K219 Gastro-esophageal reflux disease without esophagitis: Secondary | ICD-10-CM | POA: Insufficient documentation

## 2014-02-21 MED ORDER — SODIUM CHLORIDE 0.9 % IV BOLUS (SEPSIS)
1000.0000 mL | Freq: Once | INTRAVENOUS | Status: AC
  Administered 2014-02-21: 1000 mL via INTRAVENOUS

## 2014-02-21 MED ORDER — ONDANSETRON HCL 4 MG/2ML IJ SOLN
4.0000 mg | Freq: Once | INTRAMUSCULAR | Status: AC
  Administered 2014-02-21: 4 mg via INTRAVENOUS
  Filled 2014-02-21: qty 2

## 2014-02-21 MED ORDER — PROCHLORPERAZINE MALEATE 10 MG PO TABS: 10.0000 mg | ORAL_TABLET | Freq: Two times a day (BID) | ORAL | Status: DC | PRN

## 2014-02-21 MED ORDER — HYDROMORPHONE HCL PF 1 MG/ML IJ SOLN
1.0000 mg | Freq: Once | INTRAMUSCULAR | Status: AC
  Administered 2014-02-21: 1 mg via INTRAVENOUS
  Filled 2014-02-21: qty 1

## 2014-02-21 MED ORDER — BUTALBITAL-APAP-CAFFEINE 50-325-40 MG PO TABS: 1.0000 | ORAL_TABLET | Freq: Three times a day (TID) | ORAL | Status: DC | PRN

## 2014-02-21 MED ORDER — CHLORPROMAZINE HCL 25 MG/ML IJ SOLN
12.5000 mg | Freq: Once | INTRAMUSCULAR | Status: AC
  Administered 2014-02-21: 12.5 mg via INTRAVENOUS
  Filled 2014-02-21: qty 0.5

## 2014-02-21 MED ORDER — DIPHENHYDRAMINE HCL 50 MG/ML IJ SOLN
25.0000 mg | Freq: Once | INTRAMUSCULAR | Status: AC
  Administered 2014-02-21: 25 mg via INTRAVENOUS
  Filled 2014-02-21: qty 1

## 2014-02-21 MED ORDER — KETOROLAC TROMETHAMINE 30 MG/ML IJ SOLN
30.0000 mg | Freq: Once | INTRAMUSCULAR | Status: AC
  Administered 2014-02-21: 30 mg via INTRAVENOUS
  Filled 2014-02-21: qty 1

## 2014-02-21 MED ORDER — BUTALBITAL-APAP-CAFFEINE 50-325-40 MG PO TABS
2.0000 | ORAL_TABLET | Freq: Once | ORAL | Status: AC
  Administered 2014-02-21: 2 via ORAL
  Filled 2014-02-21: qty 2

## 2014-02-21 NOTE — ED Notes (Signed)
Pt c/o headache x 4 days, states she has been dry heaving because she has not been able to eat anything in several days.

## 2014-02-21 NOTE — Progress Notes (Signed)
[facility] provided pt with a list of primary care resources to help patient establish primary care. Patient stated that he was pending insurance.

## 2014-02-21 NOTE — ED Provider Notes (Signed)
CSN: [id number]     Arrival date & time 02/21/14  1130 History   First MD Initiated Contact with Patient 02/21/14 1146     Chief Complaint  Patient presents with  . Headache  . Emesis     (Consider location/radiation/quality/duration/timing/severity/associated sxs/prior Treatment) HPI Comments: Patient is a 48 year old female with history of migraines, hypercholesterolemia, anxiety and depression, GERD, IBS who presents today with migraine. She reports that her headache has been gradually worsening over the past 4 days. She describes the headache as a jackhammer diffusely throughout her head, worse on her right side. She has associated nausea and vomiting. She denies any visual disturbance, double vision, blurry vision. She does have associated photophobia. She states this feels like her normal migraine. She has been evaluated by neurology for these migraines. She has had a negative MRI of her brain.  Patient is a 48 y.o. female presenting with headaches and vomiting. The history is provided by the patient. No language interpreter was used.  Headache Associated symptoms: nausea and vomiting   Associated symptoms: no abdominal pain, no dizziness, no fever and no numbness   Emesis Associated symptoms: headaches   Associated symptoms: no abdominal pain and no chills     Past Medical History  Diagnosis Date  . GERD (gastroesophageal reflux disease)   . IBS (irritable bowel syndrome)   . Migraines   . Hypercholesteremia   . Anxiety and depression   . Panic attack    History reviewed. No pertinent past surgical history. Family History  Problem Relation Age of Onset  . Alzheimer's disease Mother    History  Substance Use Topics  . Smoking status: Light Tobacco Smoker    Types: Cigarettes  . Smokeless tobacco: Not on file  . Alcohol Use: 0.0 oz/week     Comment: occ   OB History   Grav Para Term Preterm Abortions TAB SAB Ect Mult Living                 Review of Systems   Constitutional: Negative for fever and chills.  Respiratory: Negative for shortness of breath.   Cardiovascular: Negative for chest pain.  Gastrointestinal: Positive for nausea and vomiting. Negative for abdominal pain.  Neurological: Positive for headaches. Negative for dizziness, weakness, light-headedness and numbness.  All other systems reviewed and are negative.      Allergies  Cephalexin; Clarithromycin; Penicillins; and Tramadol  Home Medications   Current Outpatient Rx  Name  Route  Sig  Dispense  Refill  . ibuprofen (ADVIL,MOTRIN) 200 MG tablet   Oral   Take 200 mg by mouth every 6 (six) hours as needed. Take 4 tablets (800 mg total) every 6 hours as needed for pain          BP 106/70  Pulse 75  Temp(Src) 98.4 F (36.9 C) (Oral)  Resp 16  SpO2 96%  LMP 02/07/2014 Physical Exam  Nursing note and vitals reviewed. Constitutional: She is oriented to person, place, and time. She appears well-developed and well-nourished. No distress.  Well appearing, NAD  HENT:  Head: Normocephalic and atraumatic.  Right Ear: External ear normal.  Left Ear: External ear normal.  Nose: Nose normal.  Mouth/Throat: Oropharynx is clear and moist.  No temporal artery tenderness  Eyes: Conjunctivae and EOM are normal. Pupils are equal, round, and reactive to light.  Neck: Normal range of motion.  No nuchal rigidity or meningeal signs  Cardiovascular: Normal rate, regular rhythm, normal heart sounds, intact distal pulses and normal  pulses.   Pulses:      Radial pulses are 2+ on the right side, and 2+ on the left side.       Dorsalis pedis pulses are 2+ on the right side, and 2+ on the left side.       Posterior tibial pulses are 2+ on the right side, and 2+ on the left side.  Pulmonary/Chest: Effort normal and breath sounds normal. No stridor. No respiratory distress. She has no wheezes. She has no rales.  Abdominal: Soft. Bowel sounds are normal. She exhibits no distension. There  is no tenderness.  Musculoskeletal: Normal range of motion.  Moves all extremities without guarding  Neurological: She is alert and oriented to person, place, and time. She has normal strength. No cranial nerve deficit (III-XII) or sensory deficit. Coordination and gait normal. GCS eye subscore is 4. GCS verbal subscore is 5. GCS motor subscore is 6.  Finger nose finger normal. Grip strength 5/5 bilaterally.  Gait is normal without antalgia or ataxia.   Skin: Skin is warm and dry. She is not diaphoretic. No erythema.  Psychiatric: She has a normal mood and affect. Her behavior is normal.    ED Course  Procedures (including critical care time) Labs Review Labs Reviewed - No data to display Imaging Review No results found.   EKG Interpretation None      MDM   Final diagnoses:  Migraine    Pt HA treated and improved while in ED. Patient still complains of pain after medications given below, but feels ready to go home. Presentation is like pts typical HA and non concerning for [facility], ICH, Meningitis, or temporal arteritis. Pt is afebrile with no focal neuro deficits, nuchal rigidity, or change in vision. Pt is to follow up with neurologist to discuss prophylactic medication. Pt verbalizes understanding and is agreeable with plan to dc.  Discussed case with Dr. Lynelle Doctor who agrees with plan.   Medications  sodium chloride 0.9 % bolus 1,000 mL (0 mLs Intravenous Stopped 02/21/14 1411)  diphenhydrAMINE (BENADRYL) injection 25 mg (25 mg Intravenous Given 02/21/14 1255)  ketorolac (TORADOL) 30 MG/ML injection 30 mg (30 mg Intravenous Given 02/21/14 1255)  chlorproMAZINE (THORAZINE) 12.5 mg in sodium chloride 0.9 % 25 mL IVPB (12.5 mg Intravenous Given 02/21/14 1254)  ondansetron (ZOFRAN) injection 4 mg (4 mg Intravenous Given 02/21/14 1336)  HYDROmorphone (DILAUDID) injection 1 mg (1 mg Intravenous Given 02/21/14 1337)  butalbital-acetaminophen-caffeine (FIORICET, ESGIC) 50-325-40 MG per tablet 2 tablet  (2 tablets Oral Given 02/21/14 1429)  sodium chloride 0.9 % bolus 1,000 mL (1,000 mLs Intravenous New Bag/Given 02/21/14 1430)  HYDROmorphone (DILAUDID) injection 1 mg (1 mg Intravenous Given 02/21/14 1500)  ondansetron (ZOFRAN) injection 4 mg (4 mg Intravenous Given 02/21/14 1459)       Mora Bellman, PA-C 02/21/14 1538

## 2014-02-21 NOTE — Discharge Instructions (Signed)
Migraine Headache A migraine headache is an intense, throbbing pain on one or both sides of your head. A migraine can last for 30 minutes to several hours. CAUSES  The exact cause of a migraine headache is not always known. However, a migraine may be caused when nerves in the brain become irritated and release chemicals that cause inflammation. This causes pain. Certain things may also trigger migraines, such as:  Alcohol.  Smoking.  Stress.  Menstruation.  Aged cheeses.  Foods or drinks that contain nitrates, glutamate, aspartame, or tyramine.  Lack of sleep.  Chocolate.  Caffeine.  Hunger.  Physical exertion.  Fatigue.  Medicines used to treat chest pain (nitroglycerine), birth control pills, estrogen, and some blood pressure medicines. SIGNS AND SYMPTOMS  Pain on one or both sides of your head.  Pulsating or throbbing pain.  Severe pain that prevents daily activities.  Pain that is aggravated by any physical activity.  Nausea, vomiting, or both.  Dizziness.  Pain with exposure to bright lights, loud noises, or activity.  General sensitivity to bright lights, loud noises, or smells. Before you get a migraine, you may get warning signs that a migraine is coming (aura). An aura may include:  Seeing flashing lights.  Seeing bright spots, halos, or zig-zag lines.  Having tunnel vision or blurred vision.  Having feelings of numbness or tingling.  Having trouble talking.  Having muscle weakness. DIAGNOSIS  A migraine headache is often diagnosed based on:  Symptoms.  Physical exam.  A CT scan or MRI of your head. These imaging tests cannot diagnose migraines, but they can help rule out other causes of headaches. TREATMENT Medicines may be given for pain and nausea. Medicines can also be given to help prevent recurrent migraines.  HOME CARE INSTRUCTIONS  Only take over-the-counter or prescription medicines for pain or discomfort as directed by your  health care provider. The use of long-term narcotics is not recommended.  Lie down in a dark, quiet room when you have a migraine.  Keep a journal to find out what may trigger your migraine headaches. For example, write down:  What you eat and drink.  How much sleep you get.  Any change to your diet or medicines.  Limit alcohol consumption.  Quit smoking if you smoke.  Get 7 9 hours of sleep, or as recommended by your health care provider.  Limit stress.  Keep lights dim if bright lights bother you and make your migraines worse. SEEK IMMEDIATE MEDICAL CARE IF:   Your migraine becomes severe.  You have a fever.  You have a stiff neck.  You have vision loss.  You have muscular weakness or loss of muscle control.  You start losing your balance or have trouble walking.  You feel faint or pass out.  You have severe symptoms that are different from your first symptoms. MAKE SURE YOU:   Understand these instructions.  Will watch your condition.  Will get help right away if you are not doing well or get worse. Document Released: 11/04/2005 Document Revised: 08/25/2013 Document Reviewed: 07/12/2013 Exit[employer] Patient Information 2014 [employer].  

## 2014-02-22 NOTE — ED Provider Notes (Signed)
Medical screening examination/treatment/procedure(s) were performed by non-physician practitioner and as supervising physician I was immediately available for consultation/collaboration.    Shaquasha Gerstel R Hamlet Lasecki, MD 02/22/14 0736 

## 2014-02-28 ENCOUNTER — Emergency Department (HOSPITAL_COMMUNITY)
Admission: EM | Admit: 2014-02-28 | Discharge: 2014-02-28 | Disposition: A | Discharge status: Home / Self Care | Payer: Medicaid Other | Attending: Emergency Medicine | Admitting: Emergency Medicine

## 2014-02-28 ENCOUNTER — Encounter (HOSPITAL_COMMUNITY): Payer: Self-pay | Admitting: Emergency Medicine

## 2014-02-28 DIAGNOSIS — Z8639 Personal history of other endocrine, nutritional and metabolic disease: Secondary | ICD-10-CM | POA: Insufficient documentation

## 2014-02-28 DIAGNOSIS — F17200 Nicotine dependence, unspecified, uncomplicated: Secondary | ICD-10-CM | POA: Insufficient documentation

## 2014-02-28 DIAGNOSIS — Z880 Allergy status to penicillin: Secondary | ICD-10-CM | POA: Insufficient documentation

## 2014-02-28 DIAGNOSIS — Z8719 Personal history of other diseases of the digestive system: Secondary | ICD-10-CM | POA: Insufficient documentation

## 2014-02-28 DIAGNOSIS — R519 Headache, unspecified: Secondary | ICD-10-CM

## 2014-02-28 DIAGNOSIS — R51 Headache: Secondary | ICD-10-CM

## 2014-02-28 DIAGNOSIS — Z8659 Personal history of other mental and behavioral disorders: Secondary | ICD-10-CM | POA: Insufficient documentation

## 2014-02-28 DIAGNOSIS — Z862 Personal history of diseases of the blood and blood-forming organs and certain disorders involving the immune mechanism: Secondary | ICD-10-CM | POA: Insufficient documentation

## 2014-02-28 DIAGNOSIS — G43909 Migraine, unspecified, not intractable, without status migrainosus: Secondary | ICD-10-CM | POA: Insufficient documentation

## 2014-02-28 DIAGNOSIS — Z79899 Other long term (current) drug therapy: Secondary | ICD-10-CM | POA: Insufficient documentation

## 2014-02-28 MED ORDER — VALPROATE SODIUM 500 MG/5ML IV SOLN: 1000.0000 mg | Freq: Once | INTRAVENOUS | Status: DC

## 2014-02-28 MED ORDER — METOCLOPRAMIDE HCL 5 MG/ML IJ SOLN
10.0000 mg | Freq: Once | INTRAMUSCULAR | Status: AC
  Administered 2014-02-28: 10 mg via INTRAVENOUS
  Filled 2014-02-28: qty 2

## 2014-02-28 MED ORDER — SODIUM CHLORIDE 0.9 % IV SOLN
1000.0000 mL | Freq: Once | INTRAVENOUS | Status: AC
  Administered 2014-02-28: 1000 mL via INTRAVENOUS

## 2014-02-28 MED ORDER — LORAZEPAM 2 MG/ML IJ SOLN
1.0000 mg | Freq: Once | INTRAMUSCULAR | Status: AC
  Administered 2014-02-28: 1 mg via INTRAVENOUS
  Filled 2014-02-28: qty 1

## 2014-02-28 MED ORDER — DIPHENHYDRAMINE HCL 50 MG/ML IJ SOLN
25.0000 mg | Freq: Once | INTRAMUSCULAR | Status: AC
  Administered 2014-02-28: 25 mg via INTRAVENOUS
  Filled 2014-02-28: qty 1

## 2014-02-28 MED ORDER — DEXAMETHASONE SODIUM PHOSPHATE 10 MG/ML IJ SOLN
10.0000 mg | Freq: Once | INTRAMUSCULAR | Status: AC
  Administered 2014-02-28: 10 mg via INTRAVENOUS
  Filled 2014-02-28: qty 1

## 2014-02-28 MED ORDER — VALPROATE SODIUM 500 MG/5ML IV SOLN
1000.0000 mg | Freq: Once | INTRAVENOUS | Status: DC
  Filled 2014-02-28: qty 10

## 2014-02-28 MED ORDER — SODIUM CHLORIDE 0.9 % IV SOLN
1000.0000 mL | INTRAVENOUS | Status: DC
  Administered 2014-02-28: 1000 mL via INTRAVENOUS

## 2014-02-28 MED ORDER — MAGNESIUM SULFATE 40 MG/ML IJ SOLN
2.0000 g | Freq: Once | INTRAMUSCULAR | Status: AC
  Administered 2014-02-28: 2 g via INTRAVENOUS
  Filled 2014-02-28: qty 50

## 2014-02-28 NOTE — Progress Notes (Signed)
[facility] provided pt with a list of primary care resources. Patient stated pending medicaid.

## 2014-02-28 NOTE — ED Provider Notes (Addendum)
CSN: [id number]     Arrival date & time 02/28/14  1045 History   First MD Initiated Contact with Patient 02/28/14 1120     Chief Complaint  Patient presents with  . Migraine    3 day hx of migaine, unresponsive to rx meds     (Consider location/radiation/quality/duration/timing/severity/associated sxs/prior Treatment) HPI Patient has a history of migraines. This is her 62 to ED visit in the past year for headache. She was just seen on April 6 for same. She states she was just seen by her neurologist in [place], Dr. Imogene Burn, 3 days ago. She states she has an appointment on Friday at [place] at family practice to see her doctor. Her neurologist started her on Topamax, Depakote, and Zomig when he saw her radiates ago. She states the following day, 2 days ago she started having of right-sided headache that is also behind her right eye. It is a excruciating jackhammer type headache. She has nausea and vomiting to the point of dry heaves. She denies any blurred vision except she does see squiggly lines. She describes photophobia and noise sensitivity. She denies any numbness or tingling of her extremities. She states she tried the Zomig 2 days ago but it didn't help. She states she usually has headaches a few times a month.  PCP Frazier Richards, BSFP Dr Imogene Burn neurologist   Past Medical History  Diagnosis Date  . GERD (gastroesophageal reflux disease)   . IBS (irritable bowel syndrome)   . Migraines   . Hypercholesteremia   . Anxiety and depression   . Panic attack    History reviewed. No pertinent past surgical history. Family History  Problem Relation Age of Onset  . Alzheimer's disease Mother    History  Substance Use Topics  . Smoking status: Light Tobacco Smoker    Types: Cigarettes  . Smokeless tobacco: Not on file  . Alcohol Use: 0.0 oz/week     Comment: occ      OB History   Grav Para Term Preterm Abortions TAB SAB Ect Mult Living                 Review of  Systems  All other systems reviewed and are negative.     Allergies  Cephalexin; Clarithromycin; Fioricet; Penicillins; and Tramadol  Home Medications   Current Outpatient Rx  Name  Route  Sig  Dispense  Refill  . divalproex (DEPAKOTE) 125 MG DR tablet   Oral   Take 125 mg by mouth 3 (three) times daily.         Marland Kitchen ibuprofen (ADVIL,MOTRIN) 200 MG tablet   Oral   Take 200 mg by mouth every 6 (six) hours as needed. Take 4 tablets (800 mg total) every 6 hours as needed for pain         . topiramate (TOPAMAX) 100 MG tablet   Oral   Take 100 mg by mouth 2 (two) times daily.         Marland Kitchen zolmitriptan (ZOMIG) 5 MG tablet   Oral   Take 5 mg by mouth as needed for migraine.          BP 114/59  Pulse 77  Temp(Src) 99.5 F (37.5 C) (Oral)  Resp 19  SpO2 99%  LMP 02/07/2014  Vital signs normal   Physical Exam  Nursing note and vitals reviewed. Constitutional: She is oriented to person, place, and time. She appears well-developed and well-nourished.  Non-toxic appearance. She does not appear ill. She appears  distressed.  Appears uncomfortable from light  HENT:  Head: Normocephalic and atraumatic.  Right Ear: External ear normal.  Left Ear: External ear normal.  Nose: Nose normal. No mucosal edema or rhinorrhea.  Mouth/Throat: Oropharynx is clear and moist and mucous membranes are normal. No dental abscesses or uvula swelling.  Eyes: Conjunctivae and EOM are normal. Pupils are equal, round, and reactive to light.  Neck: Normal range of motion and full passive range of motion without pain. Neck supple.  Cardiovascular: Normal rate, regular rhythm and normal heart sounds.  Exam reveals no gallop and no friction rub.   No murmur heard. Pulmonary/Chest: Effort normal and breath sounds normal. No respiratory distress. She has no wheezes. She has no rhonchi. She has no rales. She exhibits no tenderness and no crepitus.  Abdominal: Soft. Normal appearance and bowel sounds are  normal. She exhibits no distension. There is no tenderness. There is no rebound and no guarding.  Musculoskeletal: Normal range of motion. She exhibits no edema and no tenderness.  Moves all extremities well.   Neurological: She is alert and oriented to person, place, and time. She has normal strength. No cranial nerve deficit.  Skin: Skin is warm, dry and intact. No rash noted. No erythema. No pallor.  Psychiatric: She has a normal mood and affect. Her speech is normal and behavior is normal. Her mood appears not anxious.    ED Course  Procedures (including critical care time)  Medications  0.9 %  sodium chloride infusion (0 mLs Intravenous Stopped 02/28/14 1358)    Followed by  0.9 %  sodium chloride infusion (0 mLs Intravenous Stopped 02/28/14 1358)    Followed by  0.9 %  sodium chloride infusion (1,000 mLs Intravenous New Bag/Given 02/28/14 1359)  valproate (DEPACON) 1,000 mg in dextrose 5 % 50 mL IVPB (1,000 mg Intravenous Not Given 02/28/14 1437)  dexamethasone (DECADRON) injection 10 mg (10 mg Intravenous Given 02/28/14 1237)  metoCLOPramide (REGLAN) injection 10 mg (10 mg Intravenous Given 02/28/14 1237)  diphenhydrAMINE (BENADRYL) injection 25 mg (25 mg Intravenous Given 02/28/14 1237)  magnesium sulfate IVPB 2 g 50 mL (0 g Intravenous Stopped 02/28/14 1437)  LORazepam (ATIVAN) injection 1 mg (1 mg Intravenous Given 02/28/14 1358)     PT refused depacon. Pt sleeping when I entered the room to discuss discharge. States she isn't better. Have discussed she just got dilaudid on 4/6 and she is back again for same symptoms. She has reached the end of the headache cocktail and can go home and try her zomig again.  Nurses report she is playing with her pump and IV tubing.   Labs Review Labs Reviewed - No data to display Imaging Review No results found.   EKG Interpretation None      MDM   Final diagnoses:  Headache     Plan discharge  Devoria AlbeIva Ghada Abbett, MD, Franz DellFACEP    Rodgers Likes L Arielle Eber,  MD 02/28/14 [id number]  Ward GivensIva L Lakiya Cottam, MD 02/28/14 [phone]

## 2014-02-28 NOTE — ED Notes (Signed)
Patient was also informed by myself and MD to follow up with her physician prior to her leaving the department.

## 2014-02-28 NOTE — ED Notes (Signed)
Upon entering patient's room she was found leaning over in bed towards the floor spitting on the floor. No emesis.

## 2014-02-28 NOTE — ED Notes (Signed)
Pt reports severe  Headache, hx of migraines, x 3 days. Pt c/o N/V. Poor appetite x 3 days and photophobia

## 2014-02-28 NOTE — ED Notes (Signed)
Patient refused to sign discharge paper and vitals stating that she was just sit in the lobby and come back through the department or she will come back after the physicians change shifts. MD notified.

## 2014-02-28 NOTE — Discharge Instructions (Signed)
You need to let Dr Imogene Burn, your neurologist know that your headaches are not getting better. Go home and rest. Try your zomig when you get home.

## 2014-03-16 ENCOUNTER — Emergency Department (HOSPITAL_COMMUNITY)
Admission: EM | Admit: 2014-03-16 | Discharge: 2014-03-16 | Disposition: A | Discharge status: Home / Self Care | Payer: Medicaid Other | Attending: Emergency Medicine | Admitting: Emergency Medicine

## 2014-03-16 ENCOUNTER — Encounter (HOSPITAL_COMMUNITY): Payer: Self-pay | Admitting: Emergency Medicine

## 2014-03-16 DIAGNOSIS — F17200 Nicotine dependence, unspecified, uncomplicated: Secondary | ICD-10-CM | POA: Insufficient documentation

## 2014-03-16 DIAGNOSIS — E7800 Pure hypercholesterolemia, unspecified: Secondary | ICD-10-CM | POA: Insufficient documentation

## 2014-03-16 DIAGNOSIS — F329 Major depressive disorder, single episode, unspecified: Secondary | ICD-10-CM | POA: Insufficient documentation

## 2014-03-16 DIAGNOSIS — F3289 Other specified depressive episodes: Secondary | ICD-10-CM | POA: Insufficient documentation

## 2014-03-16 DIAGNOSIS — Z880 Allergy status to penicillin: Secondary | ICD-10-CM | POA: Insufficient documentation

## 2014-03-16 DIAGNOSIS — Z79899 Other long term (current) drug therapy: Secondary | ICD-10-CM | POA: Insufficient documentation

## 2014-03-16 DIAGNOSIS — R51 Headache: Secondary | ICD-10-CM

## 2014-03-16 DIAGNOSIS — Z8719 Personal history of other diseases of the digestive system: Secondary | ICD-10-CM | POA: Insufficient documentation

## 2014-03-16 DIAGNOSIS — R519 Headache, unspecified: Secondary | ICD-10-CM

## 2014-03-16 DIAGNOSIS — G43909 Migraine, unspecified, not intractable, without status migrainosus: Secondary | ICD-10-CM | POA: Insufficient documentation

## 2014-03-16 MED ORDER — SODIUM CHLORIDE 0.9 % IV BOLUS (SEPSIS)
1000.0000 mL | Freq: Once | INTRAVENOUS | Status: AC
  Administered 2014-03-16: 1000 mL via INTRAVENOUS

## 2014-03-16 MED ORDER — DIPHENHYDRAMINE HCL 50 MG/ML IJ SOLN
25.0000 mg | Freq: Once | INTRAMUSCULAR | Status: AC
  Administered 2014-03-16: 25 mg via INTRAVENOUS
  Filled 2014-03-16: qty 1

## 2014-03-16 MED ORDER — METOCLOPRAMIDE HCL 5 MG/ML IJ SOLN
10.0000 mg | Freq: Once | INTRAMUSCULAR | Status: AC
  Administered 2014-03-16: 10 mg via INTRAVENOUS
  Filled 2014-03-16: qty 2

## 2014-03-16 MED ORDER — HYDROMORPHONE HCL PF 1 MG/ML IJ SOLN
1.0000 mg | Freq: Once | INTRAMUSCULAR | Status: AC
  Administered 2014-03-16: 1 mg via INTRAVENOUS
  Filled 2014-03-16: qty 1

## 2014-03-16 MED ORDER — KETOROLAC TROMETHAMINE 30 MG/ML IJ SOLN
30.0000 mg | Freq: Once | INTRAMUSCULAR | Status: AC
  Administered 2014-03-16: 30 mg via INTRAVENOUS
  Filled 2014-03-16: qty 1

## 2014-03-16 NOTE — ED Notes (Signed)
Pt states n/v with migraine since last Thursday.  Pt has hx of same.  Pt has taken all home meds with no relief.

## 2014-03-16 NOTE — Discharge Instructions (Signed)
Migraine Headache A migraine headache is an intense, throbbing pain on one or both sides of your head. A migraine can last for 30 minutes to several hours. CAUSES  The exact cause of a migraine headache is not always known. However, a migraine may be caused when nerves in the brain become irritated and release chemicals that cause inflammation. This causes pain. Certain things may also trigger migraines, such as:  Alcohol.  Smoking.  Stress.  Menstruation.  Aged cheeses.  Foods or drinks that contain nitrates, glutamate, aspartame, or tyramine.  Lack of sleep.  Chocolate.  Caffeine.  Hunger.  Physical exertion.  Fatigue.  Medicines used to treat chest pain (nitroglycerine), birth control pills, estrogen, and some blood pressure medicines. SIGNS AND SYMPTOMS  Pain on one or both sides of your head.  Pulsating or throbbing pain.  Severe pain that prevents daily activities.  Pain that is aggravated by any physical activity.  Nausea, vomiting, or both.  Dizziness.  Pain with exposure to bright lights, loud noises, or activity.  General sensitivity to bright lights, loud noises, or smells. Before you get a migraine, you may get warning signs that a migraine is coming (aura). An aura may include:  Seeing flashing lights.  Seeing bright spots, halos, or zig-zag lines.  Having tunnel vision or blurred vision.  Having feelings of numbness or tingling.  Having trouble talking.  Having muscle weakness. DIAGNOSIS  A migraine headache is often diagnosed based on:  Symptoms.  Physical exam.  A CT scan or MRI of your head. These imaging tests cannot diagnose migraines, but they can help rule out other causes of headaches. TREATMENT Medicines may be given for pain and nausea. Medicines can also be given to help prevent recurrent migraines.  HOME CARE INSTRUCTIONS  Only take over-the-counter or prescription medicines for pain or discomfort as directed by your  health care provider. The use of long-term narcotics is not recommended.  Lie down in a dark, quiet room when you have a migraine.  Keep a journal to find out what may trigger your migraine headaches. For example, write down:  What you eat and drink.  How much sleep you get.  Any change to your diet or medicines.  Limit alcohol consumption.  Quit smoking if you smoke.  Get 7 9 hours of sleep, or as recommended by your health care provider.  Limit stress.  Keep lights dim if bright lights bother you and make your migraines worse. SEEK IMMEDIATE MEDICAL CARE IF:   Your migraine becomes severe.  You have a fever.  You have a stiff neck.  You have vision loss.  You have muscular weakness or loss of muscle control.  You start losing your balance or have trouble walking.  You feel faint or pass out.  You have severe symptoms that are different from your first symptoms. MAKE SURE YOU:   Understand these instructions.  Will watch your condition.  Will get help right away if you are not doing well or get worse. Document Released: 11/04/2005 Document Revised: 08/25/2013 Document Reviewed: 07/12/2013 Exit[employer] Patient Information 2014 [employer].  

## 2014-03-16 NOTE — ED Notes (Signed)
Pt alert and oriented x4. Respirations even and unlabored, bilateral symmetrical rise and fall of chest. Skin warm and dry. In no acute distress. Denies needs.   

## 2014-03-16 NOTE — ED Provider Notes (Signed)
CSN: [id number]     Arrival date & time 03/16/14  1704 History   First MD Initiated Contact with Patient 03/16/14 1809     Chief Complaint  Patient presents with  . Migraine     (Consider location/radiation/quality/duration/timing/severity/associated sxs/prior Treatment) HPI Comments: Patient presents emergency department with chief complaint of headache. She states that she has a history of migraines. She states that this current episode started on Thursday of last week. She states that the symptoms have been progressively worsening. She reports associated nausea, vomiting, photophobia, and phonophobia. She is tried taking Topamax, Zomig, and Depakote with no relief. She is followed by Dr. Imogene Burnhen from neurology. She denies any fevers, chills, weakness, numbness, or tingling of the extremities. Denies any ataxia.  The history is provided by the patient. No language interpreter was used.    Past Medical History  Diagnosis Date  . GERD (gastroesophageal reflux disease)   . IBS (irritable bowel syndrome)   . Migraines   . Hypercholesteremia   . Anxiety and depression   . Panic attack    History reviewed. No pertinent past surgical history. Family History  Problem Relation Age of Onset  . Alzheimer's disease Mother    History  Substance Use Topics  . Smoking status: Light Tobacco Smoker    Types: Cigarettes  . Smokeless tobacco: Not on file  . Alcohol Use: 0.0 oz/week     Comment: occ   OB History   Grav Para Term Preterm Abortions TAB SAB Ect Mult Living                 Review of Systems  Constitutional: Negative for fever and chills.  Respiratory: Negative for shortness of breath.   Cardiovascular: Negative for chest pain.  Gastrointestinal: Negative for nausea, vomiting, diarrhea and constipation.  Genitourinary: Negative for dysuria.      Allergies  Cephalexin; Clarithromycin; Fioricet; Penicillins; and Tramadol  Home Medications   Prior to Admission medications    Medication Sig Start Date End Date Taking? Authorizing Provider  divalproex (DEPAKOTE) 125 MG DR tablet Take 125 mg by mouth 3 (three) times daily.   Yes Historical Provider, MD  ibuprofen (ADVIL,MOTRIN) 200 MG tablet Take 400 mg by mouth every 6 (six) hours as needed (pain).    Yes Historical Provider, MD  topiramate (TOPAMAX) 100 MG tablet Take 100 mg by mouth 2 (two) times daily.   Yes Historical Provider, MD  zolmitriptan (ZOMIG) 5 MG tablet Take 5 mg by mouth as needed for migraine.   Yes Historical Provider, MD   BP 118/68  Pulse 81  Temp(Src) 98.2 F (36.8 C) (Oral)  Resp 18  SpO2 100%  LMP 02/07/2014 Physical Exam  Nursing note and vitals reviewed. Constitutional: She is oriented to person, place, and time. She appears well-developed and well-nourished.  HENT:  Head: Normocephalic and atraumatic.  Right Ear: External ear normal.  Left Ear: External ear normal.  Eyes: Conjunctivae and EOM are normal. Pupils are equal, round, and reactive to light.  Neck: Normal range of motion. Neck supple.  No pain with neck flexion, no meningismus  Cardiovascular: Normal rate, regular rhythm and normal heart sounds.  Exam reveals no gallop and no friction rub.   No murmur heard. Pulmonary/Chest: Effort normal and breath sounds normal. No respiratory distress. She has no wheezes. She has no rales. She exhibits no tenderness.  Abdominal: Soft. She exhibits no distension and no mass. There is no tenderness. There is no rebound and no guarding.  Musculoskeletal: Normal range of motion. She exhibits no edema and no tenderness.  Normal gait.  Neurological: She is alert and oriented to person, place, and time. She has normal reflexes.  CN 3-12 intact, normal finger to nose, no pronator drift, sensation and strength intact bilaterally.  Skin: Skin is warm and dry.  Psychiatric: She has a normal mood and affect. Her behavior is normal. Judgment and thought content normal.    ED Course   Procedures (including critical care time) Labs Review Labs Reviewed - No data to display  Imaging Review No results found.   EKG Interpretation None      MDM   Final diagnoses:  Headache    Patient with typical headache. She has been seen multiple times for the past for migraine. She states that she normally gets a headache cocktail, but this does not normally work. I reviewed her past records, and she normally gets Dilaudid after having a migraine cocktail. She states that the Dilaudid helped significantly, and she does not have rebound headaches afterwards. Will treat her with fluids, and will treat her headache with Dilaudid, Reglan, Benadryl. Will reassess.  8:07 PM Patient states that she is feeling better.  Nausea is significantly improved.  Headache is significantly improved.  States that she feels comfortable going home.  She is stable and ready for discharge.  Pt HA treated and improved while in ED.  Presentation is like pts typical HA and non concerning for [facility], ICH, Meningitis, or temporal arteritis. Pt is afebrile with no focal neuro deficits, nuchal rigidity, or change in vision. Pt is to follow up with PCP to discuss prophylactic medication. Pt verbalizes understanding and is agreeable with plan to dc.      Roxy Horseman, PA-C 03/16/14 2008

## 2014-03-16 NOTE — ED Provider Notes (Signed)
Medical screening examination/treatment/procedure(s) were performed by non-physician practitioner and as supervising physician I was immediately available for consultation/collaboration.   Celene Kras, MD 03/16/14 2010

## 2014-03-22 ENCOUNTER — Encounter: Payer: Self-pay | Admitting: Gastroenterology

## 2014-03-25 ENCOUNTER — Encounter (HOSPITAL_BASED_OUTPATIENT_CLINIC_OR_DEPARTMENT_OTHER): Payer: Self-pay | Admitting: Emergency Medicine

## 2014-03-25 ENCOUNTER — Emergency Department (HOSPITAL_BASED_OUTPATIENT_CLINIC_OR_DEPARTMENT_OTHER)
Admission: EM | Admit: 2014-03-25 | Discharge: 2014-03-25 | Disposition: A | Discharge status: Home / Self Care | Payer: Medicaid Other | Attending: Emergency Medicine | Admitting: Emergency Medicine

## 2014-03-25 DIAGNOSIS — F410 Panic disorder [episodic paroxysmal anxiety] without agoraphobia: Secondary | ICD-10-CM | POA: Insufficient documentation

## 2014-03-25 DIAGNOSIS — G43901 Migraine, unspecified, not intractable, with status migrainosus: Secondary | ICD-10-CM

## 2014-03-25 DIAGNOSIS — F3289 Other specified depressive episodes: Secondary | ICD-10-CM | POA: Insufficient documentation

## 2014-03-25 DIAGNOSIS — F17200 Nicotine dependence, unspecified, uncomplicated: Secondary | ICD-10-CM | POA: Insufficient documentation

## 2014-03-25 DIAGNOSIS — Z862 Personal history of diseases of the blood and blood-forming organs and certain disorders involving the immune mechanism: Secondary | ICD-10-CM | POA: Insufficient documentation

## 2014-03-25 DIAGNOSIS — Z880 Allergy status to penicillin: Secondary | ICD-10-CM | POA: Insufficient documentation

## 2014-03-25 DIAGNOSIS — Z79899 Other long term (current) drug therapy: Secondary | ICD-10-CM | POA: Insufficient documentation

## 2014-03-25 DIAGNOSIS — G43809 Other migraine, not intractable, without status migrainosus: Secondary | ICD-10-CM | POA: Insufficient documentation

## 2014-03-25 DIAGNOSIS — Z8719 Personal history of other diseases of the digestive system: Secondary | ICD-10-CM | POA: Insufficient documentation

## 2014-03-25 DIAGNOSIS — Z8639 Personal history of other endocrine, nutritional and metabolic disease: Secondary | ICD-10-CM | POA: Insufficient documentation

## 2014-03-25 DIAGNOSIS — F329 Major depressive disorder, single episode, unspecified: Secondary | ICD-10-CM | POA: Insufficient documentation

## 2014-03-25 MED ORDER — SODIUM CHLORIDE 0.9 % IV BOLUS (SEPSIS)
1000.0000 mL | Freq: Once | INTRAVENOUS | Status: AC
  Administered 2014-03-25: 1000 mL via INTRAVENOUS

## 2014-03-25 MED ORDER — DIPHENHYDRAMINE HCL 50 MG/ML IJ SOLN
25.0000 mg | Freq: Once | INTRAMUSCULAR | Status: AC
  Administered 2014-03-25: 25 mg via INTRAVENOUS
  Filled 2014-03-25: qty 1

## 2014-03-25 MED ORDER — HYDROMORPHONE HCL PF 1 MG/ML IJ SOLN
1.0000 mg | Freq: Once | INTRAMUSCULAR | Status: AC
  Administered 2014-03-25: 1 mg via INTRAVENOUS
  Filled 2014-03-25: qty 1

## 2014-03-25 MED ORDER — METOCLOPRAMIDE HCL 5 MG/ML IJ SOLN
10.0000 mg | Freq: Once | INTRAMUSCULAR | Status: AC
Start: 2014-03-25 — End: 2014-03-25
  Administered 2014-03-25: 10 mg via INTRAVENOUS
  Filled 2014-03-25: qty 2

## 2014-03-25 MED ORDER — KETOROLAC TROMETHAMINE 30 MG/ML IJ SOLN
30.0000 mg | Freq: Once | INTRAMUSCULAR | Status: AC
  Administered 2014-03-25: 30 mg via INTRAVENOUS
  Filled 2014-03-25: qty 1

## 2014-03-25 NOTE — ED Notes (Signed)
Headache x 3 days. Light sound sensitive. Nausea and vomiting.

## 2014-03-25 NOTE — Discharge Instructions (Signed)
Please call your doctor for a followup appointment within 24-48 hours. When you talk to your doctor please let them know that you were seen in the emergency department and have them acquire all of your records so that they can discuss the findings with you and formulate a treatment plan to fully care for your new and ongoing problems. Please call and set-up an appointment with your primary care provider to be re-assessed Please call and set-up an appointment with Neurologist for proper treatment and maintenance of headaches Please rest and stay hydrated Please continue to monitor symptoms closely and if symptoms are to worsen or change (fever greater than 101, chills, chest pain, shortness of breath, difficulty breathing, numbness, tingling, blurred vision, sudden loss of vision, neck stiffness, abdominal pain, inability to keep any foods or fluids down, fall, injury, syncope) please report back to the ED immediately   Migraine Headache A migraine headache is an intense, throbbing pain on one or both sides of your head. A migraine can last for 30 minutes to several hours. CAUSES  The exact cause of a migraine headache is not always known. However, a migraine may be caused when nerves in the brain become irritated and release chemicals that cause inflammation. This causes pain. Certain things may also trigger migraines, such as:  Alcohol.  Smoking.  Stress.  Menstruation.  Aged cheeses.  Foods or drinks that contain nitrates, glutamate, aspartame, or tyramine.  Lack of sleep.  Chocolate.  Caffeine.  Hunger.  Physical exertion.  Fatigue.  Medicines used to treat chest pain (nitroglycerine), birth control pills, estrogen, and some blood pressure medicines. SIGNS AND SYMPTOMS  Pain on one or both sides of your head.  Pulsating or throbbing pain.  Severe pain that prevents daily activities.  Pain that is aggravated by any physical activity.  Nausea, vomiting, or  both.  Dizziness.  Pain with exposure to bright lights, loud noises, or activity.  General sensitivity to bright lights, loud noises, or smells. Before you get a migraine, you may get warning signs that a migraine is coming (aura). An aura may include:  Seeing flashing lights.  Seeing bright spots, halos, or zig-zag lines.  Having tunnel vision or blurred vision.  Having feelings of numbness or tingling.  Having trouble talking.  Having muscle weakness. DIAGNOSIS  A migraine headache is often diagnosed based on:  Symptoms.  Physical exam.  A CT scan or MRI of your head. These imaging tests cannot diagnose migraines, but they can help rule out other causes of headaches. TREATMENT Medicines may be given for pain and nausea. Medicines can also be given to help prevent recurrent migraines.  HOME CARE INSTRUCTIONS  Only take over-the-counter or prescription medicines for pain or discomfort as directed by your health care provider. The use of long-term narcotics is not recommended.  Lie down in a dark, quiet room when you have a migraine.  Keep a journal to find out what may trigger your migraine headaches. For example, write down:  What you eat and drink.  How much sleep you get.  Any change to your diet or medicines.  Limit alcohol consumption.  Quit smoking if you smoke.  Get 7 9 hours of sleep, or as recommended by your health care provider.  Limit stress.  Keep lights dim if bright lights bother you and make your migraines worse. SEEK IMMEDIATE MEDICAL CARE IF:   Your migraine becomes severe.  You have a fever.  You have a stiff neck.  You have  vision loss.  You have muscular weakness or loss of muscle control.  You start losing your balance or have trouble walking.  You feel faint or pass out.  You have severe symptoms that are different from your first symptoms. MAKE SURE YOU:   Understand these instructions.  Will watch your  condition.  Will get help right away if you are not doing well or get worse. Document Released: 11/04/2005 Document Revised: 08/25/2013 Document Reviewed: 07/12/2013 [facility] Patient Information 2014 [place], [place].

## 2014-03-25 NOTE — ED Provider Notes (Signed)
CSN: [id number]     Arrival date & time 03/25/14  1849 History   First MD Initiated Contact with Patient 03/25/14 2005     Chief Complaint  Patient presents with  . Headache     (Consider location/radiation/quality/duration/timing/severity/associated sxs/prior Treatment) The history is provided by the patient. No language interpreter was used.  Cassidy Miles is a 48 y/o F with PMhx of GERD, IBS, hypercholesterolemia, anxiety, panic attacks, migraine presenting to the ED with migraine that started on Tuesday. Patient reported that the headache is generally all over her head. Reported that the sensation in a constant throbbing sensation - described as a jack-hammer that has not gone away. Patient reported that she has used Zomeg with minimal relief. Stated that before she got the headache she had an aura which was "squiggly" lines in her field of vision - reported that this is normal for her. Reported that she has been feeling nauseous - reported that she has vomited at least 3 times today. Reported sensitivity towards light. Stated that she is unable to keep any foods or fluids down. Reported that this is how she normally presents with headaches - reported that this is her typical migraine. Stated that she is followed by neurology regarding her headaches and have been taking Zomeg, Depakote and Topiramate. Denied blurred vision, sudden loss of vision, head trauma, chest pain, shortness breath, difficulty breathing, numbness, tingling, difficulty swallowing, fever, chills, neck pain, neck stiffness. PCP none  Past Medical History  Diagnosis Date  . GERD (gastroesophageal reflux disease)   . IBS (irritable bowel syndrome)   . Migraines   . Hypercholesteremia   . Anxiety and depression   . Panic attack    History reviewed. No pertinent past surgical history. Family History  Problem Relation Age of Onset  . Alzheimer's disease Mother    History  Substance Use Topics  . Smoking status: Light  Tobacco Smoker    Types: Cigarettes  . Smokeless tobacco: Not on file  . Alcohol Use: 0.0 oz/week     Comment: occ   OB History   Grav Para Term Preterm Abortions TAB SAB Ect Mult Living                 Review of Systems  Constitutional: Negative for fever and chills.  Eyes: Positive for photophobia. Negative for pain, redness, itching and visual disturbance.  Respiratory: Negative for chest tightness and shortness of breath.   Gastrointestinal: Positive for nausea and vomiting. Negative for abdominal pain and diarrhea.  Musculoskeletal: Negative for back pain, neck pain and neck stiffness.  Neurological: Positive for headaches. Negative for weakness.  All other systems reviewed and are negative.     Allergies  Cephalexin; Clarithromycin; Fioricet; Penicillins; and Tramadol  Home Medications   Prior to Admission medications   Medication Sig Start Date End Date Taking? Authorizing Provider  divalproex (DEPAKOTE) 125 MG DR tablet Take 125 mg by mouth 3 (three) times daily.    Historical Provider, MD  ibuprofen (ADVIL,MOTRIN) 200 MG tablet Take 400 mg by mouth every 6 (six) hours as needed (pain).     Historical Provider, MD  topiramate (TOPAMAX) 100 MG tablet Take 100 mg by mouth 2 (two) times daily.    Historical Provider, MD  zolmitriptan (ZOMIG) 5 MG tablet Take 5 mg by mouth as needed for migraine.    Historical Provider, MD   BP 123/76  Pulse 101  Temp(Src) 98.6 F (37 C) (Oral)  Resp 16  Ht 5'  4" (1.626 m)  Wt 142 lb (64.411 kg)  BMI 24.36 kg/m2  SpO2 98%  LMP 03/04/2014 Physical Exam  Nursing note and vitals reviewed. Constitutional: She is oriented to person, place, and time. She appears well-developed and well-nourished. No distress.  HENT:  Head: Normocephalic and atraumatic.  Mouth/Throat: Oropharynx is clear and moist. No oropharyngeal exudate.  Eyes: Conjunctivae and EOM are normal. Pupils are equal, round, and reactive to light. Right eye exhibits no  discharge. Left eye exhibits no discharge.  Negative nystagmus Visual fields grossly intact   Neck: Normal range of motion. Neck supple. No tracheal deviation present.  Negative neck stiffness Negative nuchal rigidity Negative cervical lymphadenopathy   Cardiovascular: Normal rate, regular rhythm and normal heart sounds.  Exam reveals no friction rub.   No murmur heard. Pulmonary/Chest: Effort normal and breath sounds normal. No respiratory distress. She has no wheezes. She has no rales.  Musculoskeletal: Normal range of motion.  Full ROM to upper and lower extremities without difficulty noted, negative ataxia noted.  Lymphadenopathy:    She has no cervical adenopathy.  Neurological: She is alert and oriented to person, place, and time. No cranial nerve deficit. She exhibits normal muscle tone. Coordination normal.  Cranial nerves III-XII grossly intact Strength 5+/5+ to upper and lower extremities bilaterally with resistance applied, equal distribution noted Sensation intact  Negative arm drift Fine motor skills intact Heel to knee down shin normal bilaterally Patient is able to bring finger to nose bilaterally without difficulty Negative facial droop Negative aphasia Negative slurred speech  Gait proper, proper balance - negative sway, negative drift, negative step-offs  Skin: Skin is warm and dry. No rash noted. She is not diaphoretic. No erythema.  Psychiatric: She has a normal mood and affect. Her behavior is normal. Thought content normal.    ED Course  Procedures (including critical care time)  11:08 PM This provider reassess the patient. Patient reported that she is feeling better. Patient able to tolerate fluids well. Negative episodes of emesis while in ED setting.  Labs Review Labs Reviewed - No data to display  Imaging Review No results found.   EKG Interpretation None      MDM   Final diagnoses:  Status migrainosus    Medications  sodium chloride 0.9  % bolus 1,000 mL (0 mLs Intravenous Stopped 03/25/14 2312)  ketorolac (TORADOL) 30 MG/ML injection 30 mg (30 mg Intravenous Given 03/25/14 2144)  metoCLOPramide (REGLAN) injection 10 mg (10 mg Intravenous Given 03/25/14 2144)  diphenhydrAMINE (BENADRYL) injection 25 mg (25 mg Intravenous Given 03/25/14 2144)  HYDROmorphone (DILAUDID) injection 1 mg (1 mg Intravenous Given 03/25/14 2230)   Filed Vitals:   03/25/14 1902  BP: 123/76  Pulse: 101  Temp: 98.6 F (37 C)  TempSrc: Oral  Resp: 16  Height: 5\' 4"  (1.626 m)  Weight: 142 lb (64.411 kg)  SpO2: 98%   Patient presenting to the ED with migraine. This provider reviewed patient's chart. Patient is been seen and assessed numerous times in ED setting regarding ongoing migraines. Doubt ICH. Doubt SAH. Doubt strokelike symptoms. Doubt subdural. Doubt meningitis. Suspicion to be typical migraine-patient has strong history of migraine. Negative focal neurological deficits noted. IV fluids and pain medications administered in ED setting. Patient responded well to medications. Patient stable, afebrile. Patient not septic appearing. Discharged patient. Referred patient to primary care provider and neurologist. Discussed with patient to continue taking at home medications as prescribed. Discussed with patient to closely monitor symptoms and if symptoms are  to worsen or change to report back to the ED - strict return instructions given.  Patient agreed to plan of care, understood, all questions answered.   Raymon Mutton, PA-C 03/26/14 1417  Kaesyn Johnston, PA-C 03/26/14 1420

## 2014-03-25 NOTE — ED Notes (Signed)
I attempted blood draw on right AC, unable to get, then PA came in room, stated she wanted an i.v. Started.

## 2014-03-26 NOTE — ED Provider Notes (Signed)
Medical screening examination/treatment/procedure(s) were performed by non-physician practitioner and as supervising physician I was immediately available for consultation/collaboration.   EKG Interpretation None        Rolland Porter, MD 03/26/14 [phone]

## 2014-04-04 ENCOUNTER — Encounter (HOSPITAL_BASED_OUTPATIENT_CLINIC_OR_DEPARTMENT_OTHER): Payer: Self-pay | Admitting: Emergency Medicine

## 2014-04-04 ENCOUNTER — Emergency Department (HOSPITAL_BASED_OUTPATIENT_CLINIC_OR_DEPARTMENT_OTHER)
Admission: EM | Admit: 2014-04-04 | Discharge: 2014-04-04 | Disposition: A | Discharge status: Home / Self Care | Payer: Medicaid Other | Attending: Emergency Medicine | Admitting: Emergency Medicine

## 2014-04-04 DIAGNOSIS — Z880 Allergy status to penicillin: Secondary | ICD-10-CM | POA: Insufficient documentation

## 2014-04-04 DIAGNOSIS — Z862 Personal history of diseases of the blood and blood-forming organs and certain disorders involving the immune mechanism: Secondary | ICD-10-CM | POA: Insufficient documentation

## 2014-04-04 DIAGNOSIS — Z8719 Personal history of other diseases of the digestive system: Secondary | ICD-10-CM | POA: Insufficient documentation

## 2014-04-04 DIAGNOSIS — F17200 Nicotine dependence, unspecified, uncomplicated: Secondary | ICD-10-CM | POA: Insufficient documentation

## 2014-04-04 DIAGNOSIS — G43909 Migraine, unspecified, not intractable, without status migrainosus: Secondary | ICD-10-CM | POA: Insufficient documentation

## 2014-04-04 DIAGNOSIS — Z8659 Personal history of other mental and behavioral disorders: Secondary | ICD-10-CM | POA: Insufficient documentation

## 2014-04-04 DIAGNOSIS — Z8639 Personal history of other endocrine, nutritional and metabolic disease: Secondary | ICD-10-CM | POA: Insufficient documentation

## 2014-04-04 MED ORDER — METOCLOPRAMIDE HCL 5 MG/ML IJ SOLN
10.0000 mg | Freq: Once | INTRAMUSCULAR | Status: AC
  Administered 2014-04-04: 10 mg via INTRAVENOUS
  Filled 2014-04-04: qty 2

## 2014-04-04 MED ORDER — KETOROLAC TROMETHAMINE 30 MG/ML IJ SOLN
30.0000 mg | Freq: Once | INTRAMUSCULAR | Status: AC
  Administered 2014-04-04: 30 mg via INTRAVENOUS
  Filled 2014-04-04: qty 1

## 2014-04-04 MED ORDER — DEXAMETHASONE SODIUM PHOSPHATE 10 MG/ML IJ SOLN: 10.0000 mg | Freq: Once | INTRAMUSCULAR | Status: DC

## 2014-04-04 MED ORDER — PROCHLORPERAZINE EDISYLATE 5 MG/ML IJ SOLN
10.0000 mg | Freq: Four times a day (QID) | INTRAMUSCULAR | Status: DC | PRN
  Filled 2014-04-04: qty 2

## 2014-04-04 MED ORDER — SODIUM CHLORIDE 0.9 % IV BOLUS (SEPSIS)
1000.0000 mL | Freq: Once | INTRAVENOUS | Status: AC
  Administered 2014-04-04: 1000 mL via INTRAVENOUS

## 2014-04-04 MED ORDER — HYDROMORPHONE HCL PF 1 MG/ML IJ SOLN
1.0000 mg | Freq: Once | INTRAMUSCULAR | Status: AC
  Administered 2014-04-04: 1 mg via INTRAVENOUS
  Filled 2014-04-04: qty 1

## 2014-04-04 NOTE — ED Provider Notes (Signed)
CSN: [id number]     Arrival date & time 04/04/14  1035 History   First MD Initiated Contact with Patient 04/04/14 1043     Chief Complaint  Patient presents with  . Migraine     (Consider location/radiation/quality/duration/timing/severity/associated sxs/prior Treatment) Patient is a 48 y.o. female presenting with migraines.  Migraine   Pt with history of migraines and frequent ED visits for same reports 4-5 days of severe throbbing headache, associated with scotoma and photophobia with nausea and vomiting. Has had little PO intake. Has previous Rx for Zomeg and Topamax but could not afford the Rx.  Past Medical History  Diagnosis Date  . GERD (gastroesophageal reflux disease)   . IBS (irritable bowel syndrome)   . Migraines   . Hypercholesteremia   . Anxiety and depression   . Panic attack    No past surgical history on file. Family History  Problem Relation Age of Onset  . Alzheimer's disease Mother    History  Substance Use Topics  . Smoking status: Light Tobacco Smoker    Types: Cigarettes  . Smokeless tobacco: Not on file  . Alcohol Use: 0.0 oz/week     Comment: occ   OB History   Grav Para Term Preterm Abortions TAB SAB Ect Mult Living                 Review of Systems All other systems reviewed and are negative except as noted in HPI.     Allergies  Cephalexin; Clarithromycin; Fioricet; Penicillins; and Tramadol  Home Medications   Prior to Admission medications   Medication Sig Start Date End Date Taking? Authorizing Provider  divalproex (DEPAKOTE) 125 MG DR tablet Take 125 mg by mouth 3 (three) times daily.    Historical Provider, MD  ibuprofen (ADVIL,MOTRIN) 200 MG tablet Take 400 mg by mouth every 6 (six) hours as needed (pain).     Historical Provider, MD  topiramate (TOPAMAX) 100 MG tablet Take 100 mg by mouth 2 (two) times daily.    Historical Provider, MD  zolmitriptan (ZOMIG) 5 MG tablet Take 5 mg by mouth as needed for migraine.    Historical  Provider, MD   BP 108/66  Pulse 97  Temp(Src) 98.1 F (36.7 C) (Oral)  Resp 16  Ht 5\' 5"  (1.651 m)  Wt 142 lb (64.411 kg)  BMI 23.63 kg/m2  SpO2 100%  LMP 03/04/2014 Physical Exam  Nursing note and vitals reviewed. Constitutional: She is oriented to person, place, and time. She appears well-developed and well-nourished.  HENT:  Head: Normocephalic and atraumatic.  Eyes: EOM are normal. Pupils are equal, round, and reactive to light.  Neck: Normal range of motion. Neck supple.  Cardiovascular: Normal rate, normal heart sounds and intact distal pulses.   Pulmonary/Chest: Effort normal and breath sounds normal.  Abdominal: Bowel sounds are normal. She exhibits no distension. There is no tenderness.  Musculoskeletal: Normal range of motion. She exhibits no edema and no tenderness.  Neurological: She is alert and oriented to person, place, and time. She has normal strength. No cranial nerve deficit or sensory deficit.  Skin: Skin is warm and dry. No rash noted.  Psychiatric: She has a normal mood and affect.    ED Course  Procedures (including critical care time) Labs Review Labs Reviewed - No data to display  Imaging Review No results found.   EKG Interpretation None      MDM   Final diagnoses:  Migraine   Typical migraine. Invariably requires Dilaudid  in the ED from previous visits. Will give IVF, toradol, reglan as well.   Pt reports partial relief of pain, offered decadron/compazine but she states those medications never work. Advised rest, fluids and followup with her neurologist.     Leonette Most B. Bernette Mayers, MD 04/04/14 [phone]

## 2014-04-04 NOTE — Discharge Instructions (Signed)
Migraine Headache A migraine headache is an intense, throbbing pain on one or both sides of your head. A migraine can last for 30 minutes to several hours. CAUSES  The exact cause of a migraine headache is not always known. However, a migraine may be caused when nerves in the brain become irritated and release chemicals that cause inflammation. This causes pain. Certain things may also trigger migraines, such as:  Alcohol.  Smoking.  Stress.  Menstruation.  Aged cheeses.  Foods or drinks that contain nitrates, glutamate, aspartame, or tyramine.  Lack of sleep.  Chocolate.  Caffeine.  Hunger.  Physical exertion.  Fatigue.  Medicines used to treat chest pain (nitroglycerine), birth control pills, estrogen, and some blood pressure medicines. SIGNS AND SYMPTOMS  Pain on one or both sides of your head.  Pulsating or throbbing pain.  Severe pain that prevents daily activities.  Pain that is aggravated by any physical activity.  Nausea, vomiting, or both.  Dizziness.  Pain with exposure to bright lights, loud noises, or activity.  General sensitivity to bright lights, loud noises, or smells. Before you get a migraine, you may get warning signs that a migraine is coming (aura). An aura may include:  Seeing flashing lights.  Seeing bright spots, halos, or zig-zag lines.  Having tunnel vision or blurred vision.  Having feelings of numbness or tingling.  Having trouble talking.  Having muscle weakness. DIAGNOSIS  A migraine headache is often diagnosed based on:  Symptoms.  Physical exam.  A CT scan or MRI of your head. These imaging tests cannot diagnose migraines, but they can help rule out other causes of headaches. TREATMENT Medicines may be given for pain and nausea. Medicines can also be given to help prevent recurrent migraines.  HOME CARE INSTRUCTIONS  Only take over-the-counter or prescription medicines for pain or discomfort as directed by your  health care provider. The use of long-term narcotics is not recommended.  Lie down in a dark, quiet room when you have a migraine.  Keep a journal to find out what may trigger your migraine headaches. For example, write down:  What you eat and drink.  How much sleep you get.  Any change to your diet or medicines.  Limit alcohol consumption.  Quit smoking if you smoke.  Get 7 9 hours of sleep, or as recommended by your health care provider.  Limit stress.  Keep lights dim if bright lights bother you and make your migraines worse. SEEK IMMEDIATE MEDICAL CARE IF:   Your migraine becomes severe.  You have a fever.  You have a stiff neck.  You have vision loss.  You have muscular weakness or loss of muscle control.  You start losing your balance or have trouble walking.  You feel faint or pass out.  You have severe symptoms that are different from your first symptoms. MAKE SURE YOU:   Understand these instructions.  Will watch your condition.  Will get help right away if you are not doing well or get worse. Document Released: 11/04/2005 Document Revised: 08/25/2013 Document Reviewed: 07/12/2013 Exit[employer] Patient Information 2014 [employer].  

## 2014-04-04 NOTE — ED Notes (Signed)
Pt has headache for 5 days.  Pt unable to get prescriptions due to cost.  Typical for pt as migraines.  Pt seen by neurologist.  Some N/V.   No known fever.

## 2014-04-04 NOTE — ED Notes (Signed)
Lights out per pt request. Warm Blankets provided. Side Rails up and call bell given .

## 2014-04-04 NOTE — ED Notes (Signed)
Called Child psychotherapist at [facility] and left message regarding medication assistance for pt.  Informed pt to call if no call back within a few days.

## 2014-04-20 ENCOUNTER — Emergency Department (HOSPITAL_BASED_OUTPATIENT_CLINIC_OR_DEPARTMENT_OTHER)
Admission: EM | Admit: 2014-04-20 | Discharge: 2014-04-20 | Disposition: A | Discharge status: Home / Self Care | Payer: Medicaid Other | Attending: Emergency Medicine | Admitting: Emergency Medicine

## 2014-04-20 ENCOUNTER — Encounter (HOSPITAL_BASED_OUTPATIENT_CLINIC_OR_DEPARTMENT_OTHER): Payer: Self-pay | Admitting: Emergency Medicine

## 2014-04-20 DIAGNOSIS — Z8719 Personal history of other diseases of the digestive system: Secondary | ICD-10-CM | POA: Insufficient documentation

## 2014-04-20 DIAGNOSIS — G43909 Migraine, unspecified, not intractable, without status migrainosus: Secondary | ICD-10-CM | POA: Insufficient documentation

## 2014-04-20 DIAGNOSIS — E7800 Pure hypercholesterolemia, unspecified: Secondary | ICD-10-CM | POA: Insufficient documentation

## 2014-04-20 DIAGNOSIS — F3289 Other specified depressive episodes: Secondary | ICD-10-CM | POA: Insufficient documentation

## 2014-04-20 DIAGNOSIS — F17200 Nicotine dependence, unspecified, uncomplicated: Secondary | ICD-10-CM | POA: Insufficient documentation

## 2014-04-20 DIAGNOSIS — F329 Major depressive disorder, single episode, unspecified: Secondary | ICD-10-CM | POA: Insufficient documentation

## 2014-04-20 DIAGNOSIS — R519 Headache, unspecified: Secondary | ICD-10-CM

## 2014-04-20 DIAGNOSIS — Z79899 Other long term (current) drug therapy: Secondary | ICD-10-CM | POA: Insufficient documentation

## 2014-04-20 DIAGNOSIS — R51 Headache: Secondary | ICD-10-CM

## 2014-04-20 DIAGNOSIS — F410 Panic disorder [episodic paroxysmal anxiety] without agoraphobia: Secondary | ICD-10-CM | POA: Insufficient documentation

## 2014-04-20 MED ORDER — METOCLOPRAMIDE HCL 5 MG/ML IJ SOLN
10.0000 mg | Freq: Once | INTRAMUSCULAR | Status: AC
  Administered 2014-04-20: 10 mg via INTRAVENOUS
  Filled 2014-04-20: qty 2

## 2014-04-20 MED ORDER — HYDROMORPHONE HCL PF 1 MG/ML IJ SOLN
1.0000 mg | Freq: Once | INTRAMUSCULAR | Status: AC
Start: 2014-04-20 — End: 2014-04-20
  Administered 2014-04-20: 1 mg via INTRAVENOUS
  Filled 2014-04-20: qty 1

## 2014-04-20 MED ORDER — SODIUM CHLORIDE 0.9 % IV BOLUS (SEPSIS)
1000.0000 mL | Freq: Once | INTRAVENOUS | Status: AC
  Administered 2014-04-20: 1000 mL via INTRAVENOUS

## 2014-04-20 MED ORDER — HYDROMORPHONE HCL PF 1 MG/ML IJ SOLN
1.0000 mg | Freq: Once | INTRAMUSCULAR | Status: AC
  Administered 2014-04-20: 1 mg via INTRAMUSCULAR
  Filled 2014-04-20: qty 1

## 2014-04-20 MED ORDER — DIPHENHYDRAMINE HCL 50 MG/ML IJ SOLN
25.0000 mg | Freq: Once | INTRAMUSCULAR | Status: AC
  Administered 2014-04-20: 25 mg via INTRAVENOUS
  Filled 2014-04-20: qty 1

## 2014-04-20 MED ORDER — KETOROLAC TROMETHAMINE 30 MG/ML IJ SOLN
30.0000 mg | Freq: Once | INTRAMUSCULAR | Status: AC
  Administered 2014-04-20: 30 mg via INTRAVENOUS
  Filled 2014-04-20: qty 1

## 2014-04-20 NOTE — Discharge Instructions (Signed)
Take your medicines as prescribed.   You need to take your topamax.   Follow up with your neurologist.   Return to ER if you have severe headache, blurry vision, vomiting.

## 2014-04-20 NOTE — ED Notes (Signed)
Entered pt's room to discharge her. Room found empty with gown lying on bed and no personal possessions in room. Pt not found in ER waiting room. Pt left without receiving discharge instructions or signing for same.

## 2014-04-20 NOTE — ED Notes (Signed)
Pt amb to room 12 with slow, steady gait reporting her usual migraine with n/v and photophobia x Sunday night.

## 2014-04-20 NOTE — ED Notes (Signed)
Pt states that her boyfriend is in the car waiting for her, and will drive her home. Informed pt that she cannot leave until I see him in person to verify that she has a ride home. Pt verbalizes understanding.

## 2014-04-20 NOTE — ED Provider Notes (Signed)
CSN: [id number]     Arrival date & time 04/20/14  1040 History   First MD Initiated Contact with Patient 04/20/14 1050     Chief Complaint  Patient presents with  . Migraine     (Consider location/radiation/quality/duration/timing/severity/associated sxs/prior Treatment) The history is provided by the patient.  Cassidy Miles is a 48 y.o. female hx of migraines here with headaches. She has chronic migraines and hasn't been able to take your toapamx and zomig for the last month because she couldn't afford it. This is her third visit in a month. For the last 4 days, worsening of chronic headache. Also with nausea vomiting and mild photophobia. She also notes aura which is typical for her headaches. No fevers or neck pain. Not acute onset.    Past Medical History  Diagnosis Date  . GERD (gastroesophageal reflux disease)   . IBS (irritable bowel syndrome)   . Migraines   . Hypercholesteremia   . Anxiety and depression   . Panic attack    History reviewed. No pertinent past surgical history. Family History  Problem Relation Age of Onset  . Alzheimer's disease Mother    History  Substance Use Topics  . Smoking status: Light Tobacco Smoker    Types: Cigarettes  . Smokeless tobacco: Not on file  . Alcohol Use: 0.0 oz/week     Comment: occ   OB History   Grav Para Term Preterm Abortions TAB SAB Ect Mult Living                 Review of Systems  Neurological: Positive for headaches.  All other systems reviewed and are negative.     Allergies  Cephalexin; Clarithromycin; Fioricet; Penicillins; and Tramadol  Home Medications   Prior to Admission medications   Medication Sig Start Date End Date Taking? Authorizing Provider  divalproex (DEPAKOTE) 125 MG DR tablet Take 125 mg by mouth 3 (three) times daily.    Historical Provider, MD  ibuprofen (ADVIL,MOTRIN) 200 MG tablet Take 400 mg by mouth every 6 (six) hours as needed (pain).     Historical Provider, MD  topiramate  (TOPAMAX) 100 MG tablet Take 100 mg by mouth 2 (two) times daily.    Historical Provider, MD  zolmitriptan (ZOMIG) 5 MG tablet Take 5 mg by mouth as needed for migraine.    Historical Provider, MD   BP 124/74  Pulse 98  Temp(Src) 97.8 F (36.6 C) (Oral)  Resp 20  Ht 5\' 5"  (1.651 m)  Wt 145 lb (65.772 kg)  BMI 24.13 kg/m2  SpO2 100%  LMP 03/04/2014 Physical Exam  Nursing note and vitals reviewed. Constitutional: She is oriented to person, place, and time.  Uncomfortable, in a dark room. Photophobic   HENT:  Head: Normocephalic.  Mouth/Throat: Oropharynx is clear and moist.  Eyes: Conjunctivae are normal. Pupils are equal, round, and reactive to light.  No papilledema   Neck: Normal range of motion. Neck supple.  Cardiovascular: Normal rate, regular rhythm and normal heart sounds.   Pulmonary/Chest: Effort normal and breath sounds normal.  Abdominal: Soft. Bowel sounds are normal. She exhibits no distension. There is no tenderness. There is no rebound and no guarding.  Musculoskeletal: Normal range of motion. She exhibits no edema and no tenderness.  Neurological: She is alert and oriented to person, place, and time. No cranial nerve deficit. Coordination normal.  Nl finger to nose. CN 2-12 intact. Nl strength throughout   Skin: Skin is warm and dry.  Psychiatric: She  has a normal mood and affect. Her behavior is normal. Judgment and thought content normal.    ED Course  Procedures (including critical care time) Labs Review Labs Reviewed - No data to display  Imaging Review No results found.   EKG Interpretation None      MDM   Final diagnoses:  Headache    Cassidy Miles is a 48 y.o. female here with chronic migraines in the setting of medication uncompliance. Also possible drug seeking behavior as her headache seem to be only responsive to dilaudid previously. Given headache cocktail but still needed to give dilaudid to get her pain better. Nonfocal neuro exam and  I doubt subarachnoid.     Richardean Canalavid H Oluwatomiwa Kinyon, MD 04/20/14 [phone]

## 2014-04-20 NOTE — ED Notes (Signed)
Pt calls this rn to room, states "this iv is hurting me..." pt requests that iv be removed, iv removed per request. Pt resting quietly in nad, denies any relief of her headache.

## 2014-04-20 NOTE — ED Notes (Addendum)
Pt was seen walking in the department. Pt states "my boyfriend is suppose to be coming back here." Pt was looking for a way out.

## 2014-05-07 ENCOUNTER — Emergency Department (HOSPITAL_BASED_OUTPATIENT_CLINIC_OR_DEPARTMENT_OTHER)
Admission: EM | Admit: 2014-05-07 | Discharge: 2014-05-07 | Disposition: A | Discharge status: Home / Self Care | Payer: Medicaid Other | Attending: Emergency Medicine | Admitting: Emergency Medicine

## 2014-05-07 ENCOUNTER — Encounter (HOSPITAL_BASED_OUTPATIENT_CLINIC_OR_DEPARTMENT_OTHER): Payer: Self-pay | Admitting: Emergency Medicine

## 2014-05-07 DIAGNOSIS — Z8659 Personal history of other mental and behavioral disorders: Secondary | ICD-10-CM | POA: Insufficient documentation

## 2014-05-07 DIAGNOSIS — Z79899 Other long term (current) drug therapy: Secondary | ICD-10-CM | POA: Insufficient documentation

## 2014-05-07 DIAGNOSIS — G43009 Migraine without aura, not intractable, without status migrainosus: Secondary | ICD-10-CM

## 2014-05-07 DIAGNOSIS — Z8719 Personal history of other diseases of the digestive system: Secondary | ICD-10-CM | POA: Insufficient documentation

## 2014-05-07 DIAGNOSIS — Z862 Personal history of diseases of the blood and blood-forming organs and certain disorders involving the immune mechanism: Secondary | ICD-10-CM | POA: Insufficient documentation

## 2014-05-07 DIAGNOSIS — Z880 Allergy status to penicillin: Secondary | ICD-10-CM | POA: Insufficient documentation

## 2014-05-07 DIAGNOSIS — F17200 Nicotine dependence, unspecified, uncomplicated: Secondary | ICD-10-CM | POA: Insufficient documentation

## 2014-05-07 DIAGNOSIS — Z8639 Personal history of other endocrine, nutritional and metabolic disease: Secondary | ICD-10-CM | POA: Insufficient documentation

## 2014-05-07 DIAGNOSIS — G43909 Migraine, unspecified, not intractable, without status migrainosus: Secondary | ICD-10-CM | POA: Insufficient documentation

## 2014-05-07 MED ORDER — METOCLOPRAMIDE HCL 5 MG/ML IJ SOLN
10.0000 mg | Freq: Once | INTRAMUSCULAR | Status: AC
  Administered 2014-05-07: 10 mg via INTRAVENOUS
  Filled 2014-05-07: qty 2

## 2014-05-07 MED ORDER — HYDROMORPHONE HCL PF 1 MG/ML IJ SOLN
1.0000 mg | Freq: Once | INTRAMUSCULAR | Status: AC
  Administered 2014-05-07: 1 mg via INTRAVENOUS
  Filled 2014-05-07: qty 1

## 2014-05-07 MED ORDER — DIPHENHYDRAMINE HCL 50 MG/ML IJ SOLN
25.0000 mg | Freq: Once | INTRAMUSCULAR | Status: AC
  Administered 2014-05-07: 25 mg via INTRAVENOUS
  Filled 2014-05-07: qty 1

## 2014-05-07 MED ORDER — SODIUM CHLORIDE 0.9 % IV SOLN
Freq: Once | INTRAVENOUS | Status: AC
  Administered 2014-05-07: 19:00:00 via INTRAVENOUS

## 2014-05-07 MED ORDER — KETOROLAC TROMETHAMINE 30 MG/ML IJ SOLN
30.0000 mg | Freq: Once | INTRAMUSCULAR | Status: AC
  Administered 2014-05-07: 30 mg via INTRAVENOUS
  Filled 2014-05-07: qty 1

## 2014-05-07 NOTE — ED Notes (Signed)
Reports mha x 3-4 days- taking ibuprofen without relief. N/v x 4 today

## 2014-05-07 NOTE — ED Provider Notes (Signed)
CSN: [id number]     Arrival date & time 05/07/14  1641 History   First MD Initiated Contact with Patient 05/07/14 1758     Chief Complaint  Patient presents with  . Migraine     (Consider location/radiation/quality/duration/timing/severity/associated sxs/prior Treatment) Patient is a 48 y.o. female presenting with migraines. The history is provided by the patient. No language interpreter was used.  Migraine This is a new problem. Episode onset: 3 days. The problem occurs constantly. The problem has been gradually worsening. Associated symptoms include vomiting. Nothing aggravates the symptoms. She has tried nothing for the symptoms. The treatment provided no relief.   Pt complains of a migraine headache.   Past Medical History  Diagnosis Date  . GERD (gastroesophageal reflux disease)   . IBS (irritable bowel syndrome)   . Migraines   . Hypercholesteremia   . Anxiety and depression   . Panic attack    History reviewed. No pertinent past surgical history. Family History  Problem Relation Age of Onset  . Alzheimer's disease Mother    History  Substance Use Topics  . Smoking status: Light Tobacco Smoker    Types: Cigarettes  . Smokeless tobacco: Not on file  . Alcohol Use: 0.0 oz/week     Comment: occ   OB History   Grav Para Term Preterm Abortions TAB SAB Ect Mult Living                 Review of Systems  Gastrointestinal: Positive for vomiting.  All other systems reviewed and are negative.     Allergies  Cephalexin; Clarithromycin; Fioricet; Penicillins; and Tramadol  Home Medications   Prior to Admission medications   Medication Sig Start Date End Date Taking? Authorizing Provider  divalproex (DEPAKOTE) 125 MG DR tablet Take 125 mg by mouth 3 (three) times daily.    Historical Provider, MD  ibuprofen (ADVIL,MOTRIN) 200 MG tablet Take 400 mg by mouth every 6 (six) hours as needed (pain).     Historical Provider, MD  topiramate (TOPAMAX) 100 MG tablet Take 100  mg by mouth 2 (two) times daily.    Historical Provider, MD  zolmitriptan (ZOMIG) 5 MG tablet Take 5 mg by mouth as needed for migraine.    Historical Provider, MD   BP 115/64  Pulse 95  Temp(Src) 98.8 F (37.1 C) (Oral)  Resp 18  SpO2 100%  LMP 04/30/2014 Physical Exam  Nursing note and vitals reviewed. Constitutional: She is oriented to person, place, and time. She appears well-developed and well-nourished.  HENT:  Head: Normocephalic.  Right Ear: External ear normal.  Left Ear: External ear normal.  Mouth/Throat: Oropharynx is clear and moist.  Eyes: Conjunctivae and EOM are normal. Pupils are equal, round, and reactive to light.  Neck: Normal range of motion.  Cardiovascular: Normal rate.   Pulmonary/Chest: Effort normal.  Abdominal: Soft. She exhibits no distension.  Musculoskeletal: Normal range of motion.  Neurological: She is alert and oriented to person, place, and time.  Psychiatric: She has a normal mood and affect.    ED Course  Procedures (including critical care time) Labs Review Labs Reviewed - No data to display  Imaging Review No results found.   EKG Interpretation None      MDM   Final diagnoses:  Nonintractable migraine, unspecified migraine type    Pt given iv ns x 1 liter,  Reglan, benadryl and torodol,   No relief  Pt given Dilaudid Iv and feels better    Elson AreasLeslie K Khila Papp,  PA-C 05/07/14 2100

## 2014-05-07 NOTE — Discharge Instructions (Signed)

## 2014-05-07 NOTE — ED Provider Notes (Signed)
History/physical exam/procedure(s) were performed by non-physician practitioner and as supervising physician I was immediately available for consultation/collaboration. I have reviewed all notes and am in agreement with care and plan.   Hilario Quarry, MD 05/07/14 2308

## 2014-05-07 NOTE — ED Notes (Signed)
D/c home with ride 

## 2014-05-07 NOTE — ED Notes (Addendum)
Pt reports migraine that started 3 days ago and is similar to previous migraines. Pt reports noncompliance with migraine medications due to financial difficulties.

## 2014-05-19 ENCOUNTER — Encounter: Payer: Self-pay | Admitting: Gastroenterology

## 2014-05-23 ENCOUNTER — Encounter (HOSPITAL_BASED_OUTPATIENT_CLINIC_OR_DEPARTMENT_OTHER): Payer: Self-pay | Admitting: Emergency Medicine

## 2014-05-23 ENCOUNTER — Emergency Department (HOSPITAL_BASED_OUTPATIENT_CLINIC_OR_DEPARTMENT_OTHER)
Admission: EM | Admit: 2014-05-23 | Discharge: 2014-05-23 | Disposition: A | Discharge status: Home / Self Care | Payer: Medicaid Other | Attending: Emergency Medicine | Admitting: Emergency Medicine

## 2014-05-23 DIAGNOSIS — F17200 Nicotine dependence, unspecified, uncomplicated: Secondary | ICD-10-CM | POA: Insufficient documentation

## 2014-05-23 DIAGNOSIS — E7800 Pure hypercholesterolemia, unspecified: Secondary | ICD-10-CM | POA: Insufficient documentation

## 2014-05-23 DIAGNOSIS — G43901 Migraine, unspecified, not intractable, with status migrainosus: Secondary | ICD-10-CM | POA: Insufficient documentation

## 2014-05-23 DIAGNOSIS — Z8719 Personal history of other diseases of the digestive system: Secondary | ICD-10-CM | POA: Insufficient documentation

## 2014-05-23 DIAGNOSIS — G43801 Other migraine, not intractable, with status migrainosus: Secondary | ICD-10-CM

## 2014-05-23 DIAGNOSIS — F410 Panic disorder [episodic paroxysmal anxiety] without agoraphobia: Secondary | ICD-10-CM | POA: Insufficient documentation

## 2014-05-23 DIAGNOSIS — Z79899 Other long term (current) drug therapy: Secondary | ICD-10-CM | POA: Insufficient documentation

## 2014-05-23 DIAGNOSIS — Z880 Allergy status to penicillin: Secondary | ICD-10-CM | POA: Insufficient documentation

## 2014-05-23 MED ORDER — HYDROMORPHONE HCL PF 1 MG/ML IJ SOLN
1.0000 mg | Freq: Once | INTRAMUSCULAR | Status: AC
  Administered 2014-05-23: 1 mg via INTRAVENOUS
  Filled 2014-05-23: qty 1

## 2014-05-23 MED ORDER — DEXAMETHASONE SODIUM PHOSPHATE 10 MG/ML IJ SOLN
10.0000 mg | Freq: Once | INTRAMUSCULAR | Status: AC
  Administered 2014-05-23: 10 mg via INTRAVENOUS
  Filled 2014-05-23: qty 1

## 2014-05-23 MED ORDER — KETOROLAC TROMETHAMINE 30 MG/ML IJ SOLN
30.0000 mg | Freq: Once | INTRAMUSCULAR | Status: AC
  Administered 2014-05-23: 30 mg via INTRAVENOUS
  Filled 2014-05-23: qty 1

## 2014-05-23 MED ORDER — METOCLOPRAMIDE HCL 5 MG/ML IJ SOLN
10.0000 mg | Freq: Once | INTRAMUSCULAR | Status: AC
  Administered 2014-05-23: 10 mg via INTRAVENOUS
  Filled 2014-05-23: qty 2

## 2014-05-23 MED ORDER — SODIUM CHLORIDE 0.9 % IV BOLUS (SEPSIS)
1000.0000 mL | Freq: Once | INTRAVENOUS | Status: AC
  Administered 2014-05-23: 1000 mL via INTRAVENOUS

## 2014-05-23 MED ORDER — DIPHENHYDRAMINE HCL 50 MG/ML IJ SOLN
25.0000 mg | Freq: Once | INTRAMUSCULAR | Status: AC
  Administered 2014-05-23: 25 mg via INTRAVENOUS
  Filled 2014-05-23: qty 1

## 2014-05-23 NOTE — ED Notes (Signed)
States the headache cocktail does not work for her and it is just a waste of medication to give it to her. Offered to refuse the medication but she states go ahead and give it but I know it wont work.

## 2014-05-23 NOTE — ED Notes (Signed)
Migraine headache x 4 days. States she passed out 4 days ago when the pain started. She collapsed at work this am and her boss brought her here. She is unsure if she will have a ride home. Alert oriented. Ambulatory to treatment room.

## 2014-05-23 NOTE — ED Notes (Addendum)
Prior to giving pt narcotics she assured me getting a ride home would be no problem. Now it is time for discharge and she states she will not be able to get a ride. Told she would not be given any Rx's until I saw a ride. Educated on risk of driving after narcotics being the same as driving under the influence of alcohol and she could get a ticket if stopped by the police. Also notified that the next time she is here and gets narcotics I will not give her the medication until I see the driver PRIOR to medication administration.

## 2014-05-23 NOTE — ED Provider Notes (Signed)
CSN: [id number]     Arrival date & time 05/23/14  1144 History   First MD Initiated Contact with Patient 05/23/14 1206     Chief Complaint  Patient presents with  . Migraine     (Consider location/radiation/quality/duration/timing/severity/associated sxs/prior Treatment) HPI Comments: Patient is a 48 year old female with history of migraine headaches, irritable bowel, anxiety. She presents with complaints of migraine headache for the past 4 days. She states she collapsed to the discomfort at work and was brought here for evaluation. She denies to me she struck her head. She denies any visual changes. This headache is typical for her normal migraines. She presents to the ER frequently for similar complaints.  Patient is a 48 y.o. female presenting with migraines.  Migraine This is a recurrent problem. Episode onset: 4 days ago. The problem occurs constantly. The problem has not changed since onset.Nothing aggravates the symptoms. Nothing relieves the symptoms. She has tried nothing for the symptoms.    Past Medical History  Diagnosis Date  . GERD (gastroesophageal reflux disease)   . IBS (irritable bowel syndrome)   . Migraines   . Hypercholesteremia   . Anxiety and depression   . Panic attack    History reviewed. No pertinent past surgical history. Family History  Problem Relation Age of Onset  . Alzheimer's disease Mother    History  Substance Use Topics  . Smoking status: Light Tobacco Smoker    Types: Cigarettes  . Smokeless tobacco: Not on file  . Alcohol Use: 0.0 oz/week     Comment: occ   OB History   Grav Para Term Preterm Abortions TAB SAB Ect Mult Living                 Review of Systems  All other systems reviewed and are negative.     Allergies  Cephalexin; Clarithromycin; Fioricet; Penicillins; and Tramadol  Home Medications   Prior to Admission medications   Medication Sig Start Date End Date Taking? Authorizing Provider  divalproex (DEPAKOTE) 125  MG DR tablet Take 125 mg by mouth 3 (three) times daily.    Historical Provider, MD  ibuprofen (ADVIL,MOTRIN) 200 MG tablet Take 400 mg by mouth every 6 (six) hours as needed (pain).     Historical Provider, MD  topiramate (TOPAMAX) 100 MG tablet Take 100 mg by mouth 2 (two) times daily.    Historical Provider, MD  zolmitriptan (ZOMIG) 5 MG tablet Take 5 mg by mouth as needed for migraine.    Historical Provider, MD   BP 118/67  Pulse 99  Temp(Src) 98.2 F (36.8 C) (Oral)  Resp 20  Ht 5\' 5"  (1.651 m)  Wt 140 lb (63.504 kg)  BMI 23.30 kg/m2  SpO2 100%  LMP 04/30/2014 Physical Exam  Nursing note and vitals reviewed. Constitutional: She is oriented to person, place, and time. She appears well-developed and well-nourished. No distress.  HENT:  Head: Normocephalic and atraumatic.  Eyes: EOM are normal. Pupils are equal, round, and reactive to light.  There is no papilledema.  Neck: Normal range of motion. Neck supple.  Cardiovascular: Normal rate and regular rhythm.  Exam reveals no gallop and no friction rub.   No murmur heard. Pulmonary/Chest: Effort normal and breath sounds normal. No respiratory distress. She has no wheezes.  Abdominal: Soft. Bowel sounds are normal. She exhibits no distension. There is no tenderness.  Musculoskeletal: Normal range of motion.  Neurological: She is alert and oriented to person, place, and time. No cranial nerve deficit.  She exhibits normal muscle tone. Coordination normal.  Skin: Skin is warm and dry. She is not diaphoretic.    ED Course  Procedures (including critical care time) Labs Review Labs Reviewed - No data to display  Imaging Review No results found.   EKG Interpretation None      MDM   Final diagnoses:  None    Patient feeling better with medications given in the ER. There are no red flags to suggest a emergent cause of her discomfort. She is to followup with her neurologist to discuss the increasing frequency of her  headaches.    Geoffery Lyons, MD 05/23/14 1524

## 2014-05-23 NOTE — Discharge Instructions (Signed)
Continue your medications as before.  Followup with your neurologist to discuss the possible causes of the increased frequency of your headaches.   Migraine Headache A migraine headache is an intense, throbbing pain on one or both sides of your head. A migraine can last for 30 minutes to several hours. CAUSES  The exact cause of a migraine headache is not always known. However, a migraine may be caused when nerves in the brain become irritated and release chemicals that cause inflammation. This causes pain. Certain things may also trigger migraines, such as:  Alcohol.  Smoking.  Stress.  Menstruation.  Aged cheeses.  Foods or drinks that contain nitrates, glutamate, aspartame, or tyramine.  Lack of sleep.  Chocolate.  Caffeine.  Hunger.  Physical exertion.  Fatigue.  Medicines used to treat chest pain (nitroglycerine), birth control pills, estrogen, and some blood pressure medicines. SIGNS AND SYMPTOMS  Pain on one or both sides of your head.  Pulsating or throbbing pain.  Severe pain that prevents daily activities.  Pain that is aggravated by any physical activity.  Nausea, vomiting, or both.  Dizziness.  Pain with exposure to bright lights, loud noises, or activity.  General sensitivity to bright lights, loud noises, or smells. Before you get a migraine, you may get warning signs that a migraine is coming (aura). An aura may include:  Seeing flashing lights.  Seeing bright spots, halos, or zig-zag lines.  Having tunnel vision or blurred vision.  Having feelings of numbness or tingling.  Having trouble talking.  Having muscle weakness. DIAGNOSIS  A migraine headache is often diagnosed based on:  Symptoms.  Physical exam.  A CT scan or MRI of your head. These imaging tests cannot diagnose migraines, but they can help rule out other causes of headaches. TREATMENT Medicines may be given for pain and nausea. Medicines can also be given to help  prevent recurrent migraines.  HOME CARE INSTRUCTIONS  Only take over-the-counter or prescription medicines for pain or discomfort as directed by your health care provider. The use of long-term narcotics is not recommended.  Lie down in a dark, quiet room when you have a migraine.  Keep a journal to find out what may trigger your migraine headaches. For example, write down:  What you eat and drink.  How much sleep you get.  Any change to your diet or medicines.  Limit alcohol consumption.  Quit smoking if you smoke.  Get 7-9 hours of sleep, or as recommended by your health care provider.  Limit stress.  Keep lights dim if bright lights bother you and make your migraines worse. SEEK IMMEDIATE MEDICAL CARE IF:   Your migraine becomes severe.  You have a fever.  You have a stiff neck.  You have vision loss.  You have muscular weakness or loss of muscle control.  You start losing your balance or have trouble walking.  You feel faint or pass out.  You have severe symptoms that are different from your first symptoms. MAKE SURE YOU:   Understand these instructions.  Will watch your condition.  Will get help right away if you are not doing well or get worse. Document Released: 11/04/2005 Document Revised: 08/25/2013 Document Reviewed: 07/12/2013 [facility] Patient Information 2015 [place], [place]. This information is not intended to replace advice given to you by your health care provider. Make sure you discuss any questions you have with your health care provider.

## 2014-06-13 ENCOUNTER — Emergency Department (HOSPITAL_BASED_OUTPATIENT_CLINIC_OR_DEPARTMENT_OTHER)
Admission: EM | Admit: 2014-06-13 | Discharge: 2014-06-13 | Disposition: A | Discharge status: Home / Self Care | Payer: Medicaid Other | Attending: Emergency Medicine | Admitting: Emergency Medicine

## 2014-06-13 ENCOUNTER — Encounter (HOSPITAL_BASED_OUTPATIENT_CLINIC_OR_DEPARTMENT_OTHER): Payer: Self-pay | Admitting: Emergency Medicine

## 2014-06-13 DIAGNOSIS — G43009 Migraine without aura, not intractable, without status migrainosus: Secondary | ICD-10-CM

## 2014-06-13 DIAGNOSIS — G43909 Migraine, unspecified, not intractable, without status migrainosus: Secondary | ICD-10-CM | POA: Insufficient documentation

## 2014-06-13 DIAGNOSIS — Z8659 Personal history of other mental and behavioral disorders: Secondary | ICD-10-CM | POA: Insufficient documentation

## 2014-06-13 DIAGNOSIS — Z79899 Other long term (current) drug therapy: Secondary | ICD-10-CM | POA: Insufficient documentation

## 2014-06-13 DIAGNOSIS — Z8639 Personal history of other endocrine, nutritional and metabolic disease: Secondary | ICD-10-CM | POA: Insufficient documentation

## 2014-06-13 DIAGNOSIS — Z8719 Personal history of other diseases of the digestive system: Secondary | ICD-10-CM | POA: Insufficient documentation

## 2014-06-13 DIAGNOSIS — Z862 Personal history of diseases of the blood and blood-forming organs and certain disorders involving the immune mechanism: Secondary | ICD-10-CM | POA: Insufficient documentation

## 2014-06-13 DIAGNOSIS — F17200 Nicotine dependence, unspecified, uncomplicated: Secondary | ICD-10-CM | POA: Insufficient documentation

## 2014-06-13 DIAGNOSIS — Z791 Long term (current) use of non-steroidal anti-inflammatories (NSAID): Secondary | ICD-10-CM | POA: Insufficient documentation

## 2014-06-13 DIAGNOSIS — Z880 Allergy status to penicillin: Secondary | ICD-10-CM | POA: Insufficient documentation

## 2014-06-13 MED ORDER — METOCLOPRAMIDE HCL 5 MG/ML IJ SOLN
10.0000 mg | Freq: Once | INTRAMUSCULAR | Status: AC
  Administered 2014-06-13: 10 mg via INTRAVENOUS
  Filled 2014-06-13: qty 2

## 2014-06-13 MED ORDER — HYDROMORPHONE HCL PF 1 MG/ML IJ SOLN
1.0000 mg | Freq: Once | INTRAMUSCULAR | Status: AC
  Administered 2014-06-13: 1 mg via INTRAVENOUS
  Filled 2014-06-13: qty 1

## 2014-06-13 MED ORDER — ONDANSETRON HCL 4 MG/2ML IJ SOLN
4.0000 mg | Freq: Once | INTRAMUSCULAR | Status: AC
  Administered 2014-06-13: 4 mg via INTRAVENOUS
  Filled 2014-06-13: qty 2

## 2014-06-13 MED ORDER — DIPHENHYDRAMINE HCL 50 MG/ML IJ SOLN
25.0000 mg | Freq: Once | INTRAMUSCULAR | Status: AC
  Administered 2014-06-13: 25 mg via INTRAVENOUS
  Filled 2014-06-13: qty 1

## 2014-06-13 MED ORDER — SODIUM CHLORIDE 0.9 % IV SOLN
Freq: Once | INTRAVENOUS | Status: AC
  Administered 2014-06-13: 12:00:00 via INTRAVENOUS

## 2014-06-13 NOTE — ED Provider Notes (Signed)
Medical screening examination/treatment/procedure(s) were performed by non-physician practitioner and as supervising physician I was immediately available for consultation/collaboration.   EKG Interpretation None       Ethelda Chick, MD 06/13/14 1407

## 2014-06-13 NOTE — ED Notes (Signed)
C/o migraine since 7/24

## 2014-06-13 NOTE — ED Provider Notes (Signed)
CSN: [id number]     Arrival date & time 06/13/14  1045 History   First MD Initiated Contact with Patient 06/13/14 1104     Chief Complaint  Patient presents with  . Migraine     (Consider location/radiation/quality/duration/timing/severity/associated sxs/prior Treatment) Patient is a 48 y.o. female presenting with migraines. The history is provided by the patient. No language interpreter was used.  Migraine This is a new problem. The current episode started today. The problem occurs constantly. The problem has been gradually worsening. Associated symptoms include nausea. Nothing aggravates the symptoms. She has tried nothing for the symptoms. The treatment provided moderate relief.  Pt reports migraine cocktail does not work.   Pt reports usually requires pain medication and nausea medication  Past Medical History  Diagnosis Date  . GERD (gastroesophageal reflux disease)   . IBS (irritable bowel syndrome)   . Migraines   . Hypercholesteremia   . Anxiety and depression   . Panic attack    History reviewed. No pertinent past surgical history. Family History  Problem Relation Age of Onset  . Alzheimer's disease Mother    History  Substance Use Topics  . Smoking status: Current Every Day Smoker    Types: Cigarettes  . Smokeless tobacco: Not on file  . Alcohol Use: 0.0 oz/week     Comment: occ   OB History   Grav Para Term Preterm Abortions TAB SAB Ect Mult Living                 Review of Systems  Gastrointestinal: Positive for nausea.  All other systems reviewed and are negative.     Allergies  Cephalexin; Clarithromycin; Fioricet; Penicillins; and Tramadol  Home Medications   Prior to Admission medications   Medication Sig Start Date End Date Taking? Authorizing Provider  divalproex (DEPAKOTE) 125 MG DR tablet Take 125 mg by mouth 3 (three) times daily.    Historical Provider, MD  ibuprofen (ADVIL,MOTRIN) 200 MG tablet Take 400 mg by mouth every 6 (six) hours as  needed (pain).     Historical Provider, MD  topiramate (TOPAMAX) 100 MG tablet Take 100 mg by mouth 2 (two) times daily.    Historical Provider, MD  zolmitriptan (ZOMIG) 5 MG tablet Take 5 mg by mouth as needed for migraine.    Historical Provider, MD   BP 122/71  Pulse 89  Temp(Src) 98 F (36.7 C) (Oral)  Resp 20  Ht 5\' 5"  (1.651 m)  Wt 140 lb (63.504 kg)  BMI 23.30 kg/m2  SpO2 100%  LMP 05/30/2014 Physical Exam  Nursing note and vitals reviewed. Constitutional: She is oriented to person, place, and time. She appears well-developed and well-nourished.  HENT:  Head: Normocephalic and atraumatic.  Eyes: Conjunctivae and EOM are normal. Pupils are equal, round, and reactive to light.  Neck: Normal range of motion.  Cardiovascular: Normal rate.   Pulmonary/Chest: Effort normal.  Abdominal: She exhibits no distension.  Musculoskeletal: Normal range of motion.  Neurological: She is alert and oriented to person, place, and time.  Skin: Skin is warm.  Psychiatric: She has a normal mood and affect.    ED Course  Procedures (including critical care time) Labs Review Labs Reviewed - No data to display  Imaging Review No results found.   EKG Interpretation None      MDM   Final diagnoses:  Nonintractable migraine, unspecified migraine type    Pt given iv fluids and dilaudid.   Pt feels some better.   Pt  complains of continued headache Pt given second dosage    Elson AreasLeslie K Parrish Daddario, PA-C 06/13/14 1352  Lonia SkinnerLeslie K HartlandSofia, [place] JerseyPA-C 06/13/14 1352

## 2014-06-13 NOTE — Discharge Instructions (Signed)

## 2014-06-22 ENCOUNTER — Encounter (HOSPITAL_BASED_OUTPATIENT_CLINIC_OR_DEPARTMENT_OTHER): Payer: Self-pay | Admitting: Emergency Medicine

## 2014-06-22 ENCOUNTER — Emergency Department (HOSPITAL_BASED_OUTPATIENT_CLINIC_OR_DEPARTMENT_OTHER)
Admission: EM | Admit: 2014-06-22 | Discharge: 2014-06-22 | Disposition: A | Discharge status: Home / Self Care | Payer: Medicaid Other | Attending: Emergency Medicine | Admitting: Emergency Medicine

## 2014-06-22 DIAGNOSIS — Z79899 Other long term (current) drug therapy: Secondary | ICD-10-CM | POA: Insufficient documentation

## 2014-06-22 DIAGNOSIS — Z862 Personal history of diseases of the blood and blood-forming organs and certain disorders involving the immune mechanism: Secondary | ICD-10-CM | POA: Insufficient documentation

## 2014-06-22 DIAGNOSIS — Z8639 Personal history of other endocrine, nutritional and metabolic disease: Secondary | ICD-10-CM | POA: Insufficient documentation

## 2014-06-22 DIAGNOSIS — Z791 Long term (current) use of non-steroidal anti-inflammatories (NSAID): Secondary | ICD-10-CM | POA: Insufficient documentation

## 2014-06-22 DIAGNOSIS — F17200 Nicotine dependence, unspecified, uncomplicated: Secondary | ICD-10-CM | POA: Insufficient documentation

## 2014-06-22 DIAGNOSIS — R51 Headache: Secondary | ICD-10-CM

## 2014-06-22 DIAGNOSIS — Z8719 Personal history of other diseases of the digestive system: Secondary | ICD-10-CM | POA: Insufficient documentation

## 2014-06-22 DIAGNOSIS — R519 Headache, unspecified: Secondary | ICD-10-CM

## 2014-06-22 DIAGNOSIS — G43909 Migraine, unspecified, not intractable, without status migrainosus: Secondary | ICD-10-CM | POA: Insufficient documentation

## 2014-06-22 DIAGNOSIS — Z880 Allergy status to penicillin: Secondary | ICD-10-CM | POA: Insufficient documentation

## 2014-06-22 DIAGNOSIS — Z8659 Personal history of other mental and behavioral disorders: Secondary | ICD-10-CM | POA: Insufficient documentation

## 2014-06-22 MED ORDER — KETOROLAC TROMETHAMINE 30 MG/ML IJ SOLN
30.0000 mg | Freq: Once | INTRAMUSCULAR | Status: AC
  Administered 2014-06-22: 30 mg via INTRAVENOUS
  Filled 2014-06-22: qty 1

## 2014-06-22 MED ORDER — DIPHENHYDRAMINE HCL 50 MG/ML IJ SOLN
25.0000 mg | Freq: Once | INTRAMUSCULAR | Status: AC
  Administered 2014-06-22: 25 mg via INTRAVENOUS
  Filled 2014-06-22: qty 1

## 2014-06-22 MED ORDER — SODIUM CHLORIDE 0.9 % IV BOLUS (SEPSIS)
500.0000 mL | Freq: Once | INTRAVENOUS | Status: AC
Start: 2014-06-22 — End: 2014-06-22
  Administered 2014-06-22: 15:00:00 via INTRAVENOUS

## 2014-06-22 MED ORDER — METOCLOPRAMIDE HCL 5 MG/ML IJ SOLN
10.0000 mg | Freq: Once | INTRAMUSCULAR | Status: AC
  Administered 2014-06-22: 10 mg via INTRAVENOUS
  Filled 2014-06-22: qty 2

## 2014-06-22 NOTE — Discharge Instructions (Signed)
Please read and follow all provided instructions.  Your diagnoses today include:  1. Acute intractable headache, unspecified headache type     Tests performed today include:  Vital signs. See below for your results today.   Medications:  In the Emergency Department you received:  Reglan - antinausea/headache medication  Benadryl - antihistamine to counteract potential side effects of reglan  Toradol - NSAID medication similar to ibuprofen  Take any prescribed medications only as directed.  Additional information:  Follow any educational materials contained in this packet.  You are having a headache. No specific cause was found today for your headache. It may have been a migraine or other cause of headache. Stress, anxiety, fatigue, and depression are common triggers for headaches.   Your headache today does not appear to be life-threatening or require hospitalization, but often the exact cause of headaches is not determined in the emergency department. Therefore, follow-up with your doctor is very important to find out what may have caused your headache and whether or not you need any further diagnostic testing or treatment.   Sometimes headaches can appear benign (not harmful), but then more serious symptoms can develop which should prompt an immediate re-evaluation by your doctor or the emergency department.  BE VERY CAREFUL not to take multiple medicines containing Tylenol (also called acetaminophen). Doing so can lead to an overdose which can damage your liver and cause liver failure and possibly death.   Follow-up instructions: Please follow-up with your neurologist in the next 3 days for further evaluation of your symptoms.   Return instructions:   Please return to the Emergency Department if you experience worsening symptoms.  Return if the medications do not resolve your headache, if it recurs, or if you have multiple episodes of vomiting or cannot keep down  fluids.  Return if you have a change from the usual headache.  RETURN IMMEDIATELY IF you:  Develop a sudden, severe headache  Develop confusion or become poorly responsive or faint  Develop a fever above 100.60F or problem breathing  Have a change in speech, vision, swallowing, or understanding  Develop new weakness, numbness, tingling, incoordination in your arms or legs  Have a seizure  Please return if you have any other emergent concerns.  Additional Information:  Your vital signs today were: BP 113/70   Pulse 98   Temp(Src) 98.3 F (36.8 C) (Oral)   Resp 18   Ht 5\' 5"  (1.651 m)   Wt 140 lb (63.504 kg)   BMI 23.30 kg/m2   SpO2 100%   LMP 05/30/2014 If your blood pressure (BP) was elevated above 135/85 this visit, please have this repeated by your doctor within one month. --------------

## 2014-06-22 NOTE — ED Provider Notes (Signed)
CSN: [id number]     Arrival date & time 06/22/14  1339 History   First MD Initiated Contact with Patient 06/22/14 1400     Chief Complaint  Patient presents with  . Migraine     (Consider location/radiation/quality/duration/timing/severity/associated sxs/prior Treatment) HPI Comments: Patient presents with complaint of migraine headache. Patient has frequent headaches. She describes her headache as right-sided, feels like a "jackhammer" for the past 3 days. She has taken ibuprofen without relief. Headache associated with photophobia, phonophobia, nausea and vomiting. She was on Topamax and Zomig prior, but ran out of these 2 weeks ago. She states these don't help very much. Patient denies hitting her head or fever. She denies URI symptoms including sinus pressure. She denies dental pain. Patient is seen by a neurologist (Dr. Terrace Arabia). Patient typically receives 1-2mg  dilaudid injections during her visits here despite these not being appropriate treatments. She has tried Imitrex, migraine cocktail, and toradol in past -- all of which are ineffective. The onset of this condition was acute. The course is constant. Aggravating factors: none. Alleviating factors: none.    Patient is a 48 y.o. female presenting with migraines. The history is provided by the patient and medical records.  Migraine Associated symptoms include headaches, nausea and vomiting. Pertinent negatives include no chest pain, congestion, fever, neck pain, numbness, rash or weakness.    Past Medical History  Diagnosis Date  . GERD (gastroesophageal reflux disease)   . IBS (irritable bowel syndrome)   . Migraines   . Hypercholesteremia   . Anxiety and depression   . Panic attack    History reviewed. No pertinent past surgical history. Family History  Problem Relation Age of Onset  . Alzheimer's disease Mother    History  Substance Use Topics  . Smoking status: Current Every Day Smoker    Types: Cigarettes  . Smokeless  tobacco: Not on file  . Alcohol Use: 0.0 oz/week     Comment: occ   OB History   Grav Para Term Preterm Abortions TAB SAB Ect Mult Living                 Review of Systems  Constitutional: Negative for fever.  HENT: Negative for congestion, dental problem, rhinorrhea and sinus pressure.   Eyes: Positive for photophobia. Negative for discharge, redness and visual disturbance.  Respiratory: Negative for shortness of breath.   Cardiovascular: Negative for chest pain.  Gastrointestinal: Positive for nausea and vomiting.  Musculoskeletal: Negative for gait problem, neck pain and neck stiffness.  Skin: Negative for rash.  Neurological: Positive for headaches. Negative for syncope, speech difficulty, weakness, light-headedness and numbness.  Psychiatric/Behavioral: Negative for confusion.      Allergies  Cephalexin; Clarithromycin; Fioricet; Penicillins; and Tramadol  Home Medications   Prior to Admission medications   Medication Sig Start Date End Date Taking? Authorizing Provider  divalproex (DEPAKOTE) 125 MG DR tablet Take 125 mg by mouth 3 (three) times daily.    Historical Provider, MD  ibuprofen (ADVIL,MOTRIN) 200 MG tablet Take 400 mg by mouth every 6 (six) hours as needed (pain).     Historical Provider, MD  topiramate (TOPAMAX) 100 MG tablet Take 100 mg by mouth 2 (two) times daily.    Historical Provider, MD  zolmitriptan (ZOMIG) 5 MG tablet Take 5 mg by mouth as needed for migraine.    Historical Provider, MD   BP 113/70  Pulse 98  Temp(Src) 98.3 F (36.8 C) (Oral)  Resp 18  Ht 5\' 5"  (1.651 m)  Wt 140 lb (63.504 kg)  BMI 23.30 kg/m2  SpO2 100%  LMP 05/30/2014  Physical Exam  Nursing note and vitals reviewed. Constitutional: She is oriented to person, place, and time. She appears well-developed and well-nourished.  HENT:  Head: Normocephalic and atraumatic.  Right Ear: Tympanic membrane, external ear and ear canal normal.  Left Ear: Tympanic membrane, external  ear and ear canal normal.  Nose: Nose normal.  Mouth/Throat: Uvula is midline, oropharynx is clear and moist and mucous membranes are normal.  Eyes: Conjunctivae, EOM and lids are normal. Pupils are equal, round, and reactive to light. Right eye exhibits no discharge. Left eye exhibits no discharge. Right eye exhibits no nystagmus. Left eye exhibits no nystagmus.  Neck: Normal range of motion. Neck supple.  Cardiovascular: Normal rate, regular rhythm and normal heart sounds.   Pulmonary/Chest: Effort normal and breath sounds normal.  Abdominal: Soft. There is no tenderness.  Musculoskeletal:       Cervical back: She exhibits normal range of motion, no tenderness and no bony tenderness.  Neurological: She is alert and oriented to person, place, and time. She has normal strength and normal reflexes. No cranial nerve deficit or sensory deficit. She displays a negative Romberg sign. Coordination and gait normal. GCS eye subscore is 4. GCS verbal subscore is 5. GCS motor subscore is 6.  Skin: Skin is warm and dry.  Psychiatric: She has a normal mood and affect.    ED Course  Procedures (including critical care time) Labs Review Labs Reviewed - No data to display  Imaging Review No results found.   EKG Interpretation None      2:51 PM Patient seen and examined. Previous records reviewed.    Vital signs reviewed and are as follows: Filed Vitals:   06/22/14 1358  BP: 113/70  Pulse: 98  Temp: 98.3 F (36.8 C)  Resp: 18   Patient is here several times per month. Typically she received Dilaudid.   I discussed with the patient appropriate options for treatment of her headaches. I offered migraine cocktail of Benadryl and Reglan, subcutaneous Imitrex, Toradol. Patient is adamant that the only thing that makes her feel better is Dilaudid. I explained why this is not the best choice for her recurrent migraines. She refuses these treatments. I encouraged patient to followup with her  neurologist.  Patient became frustrated, began swearing, and walked to the emergency department without treatment her discharge paperwork.  3:09 PM Patient returned to the ED requesting migraine cocktail. I have ordered reglan, benadryl, toradol, and 500cc IV fluids. Patient can then be discharged to home.    MDM   Final diagnoses:  Acute intractable headache, unspecified headache type     Patient with her typical migraine symptoms unrelieved with over-the-counter medications. I refused narcotic medications today as these are not indicated as abortive treatment for headache. I do not feel that continually giving dilaudid at her visits are appropriate or helpful for this patient.   Patient without high-risk features of headache including: sudden onset/thunderclap HA, no similar headache in past, altered mental status, accompanying seizure, headache with exertion, age > 72, history of immunocompromised, neck or shoulder pain, fever, use of anticoagulation, family history of spontaneous SAH, concomitant drug use, toxic exposure.   Patient has a normal complete neurological exam, normal vital signs, normal level of consciousness, no signs of meningismus, is well-appearing/non-toxic appearing, no signs of trauma, no pain over the temporal arteries.   Imaging with CT/MRI not indicated given history and physical  exam findings.   No dangerous or life-threatening conditions suspected or identified by history, physical exam, and by work-up. No indications for hospitalization identified.      Renne CriglerJoshua Kamdyn Covel, PA-C 06/22/14 [address][id number]  Mechel Schutter, [place] JerseyPA-C 06/22/14 1510

## 2014-06-22 NOTE — ED Provider Notes (Signed)
Medical screening examination/treatment/procedure(s) were performed by non-physician practitioner and as supervising physician I was immediately available for consultation/collaboration.   EKG Interpretation None        Rolan Bucco, MD 06/22/14 1524

## 2014-06-22 NOTE — ED Notes (Signed)
C.o migraine x 3 days

## 2014-06-22 NOTE — ED Notes (Signed)
Pt request coke to drink, po given

## 2014-07-18 ENCOUNTER — Emergency Department (HOSPITAL_BASED_OUTPATIENT_CLINIC_OR_DEPARTMENT_OTHER)
Admission: EM | Admit: 2014-07-18 | Discharge: 2014-07-18 | Disposition: A | Discharge status: Home / Self Care | Payer: Medicaid Other | Attending: Emergency Medicine | Admitting: Emergency Medicine

## 2014-07-18 ENCOUNTER — Encounter (HOSPITAL_BASED_OUTPATIENT_CLINIC_OR_DEPARTMENT_OTHER): Payer: Self-pay | Admitting: Emergency Medicine

## 2014-07-18 DIAGNOSIS — Z862 Personal history of diseases of the blood and blood-forming organs and certain disorders involving the immune mechanism: Secondary | ICD-10-CM | POA: Insufficient documentation

## 2014-07-18 DIAGNOSIS — Z791 Long term (current) use of non-steroidal anti-inflammatories (NSAID): Secondary | ICD-10-CM | POA: Insufficient documentation

## 2014-07-18 DIAGNOSIS — Z8659 Personal history of other mental and behavioral disorders: Secondary | ICD-10-CM | POA: Insufficient documentation

## 2014-07-18 DIAGNOSIS — Z8719 Personal history of other diseases of the digestive system: Secondary | ICD-10-CM | POA: Insufficient documentation

## 2014-07-18 DIAGNOSIS — G43909 Migraine, unspecified, not intractable, without status migrainosus: Secondary | ICD-10-CM | POA: Insufficient documentation

## 2014-07-18 DIAGNOSIS — Z880 Allergy status to penicillin: Secondary | ICD-10-CM | POA: Insufficient documentation

## 2014-07-18 DIAGNOSIS — R000 Tachycardia, unspecified: Secondary | ICD-10-CM | POA: Insufficient documentation

## 2014-07-18 DIAGNOSIS — Z8639 Personal history of other endocrine, nutritional and metabolic disease: Secondary | ICD-10-CM | POA: Insufficient documentation

## 2014-07-18 DIAGNOSIS — F17200 Nicotine dependence, unspecified, uncomplicated: Secondary | ICD-10-CM | POA: Insufficient documentation

## 2014-07-18 DIAGNOSIS — G43001 Migraine without aura, not intractable, with status migrainosus: Secondary | ICD-10-CM | POA: Insufficient documentation

## 2014-07-18 DIAGNOSIS — Z79899 Other long term (current) drug therapy: Secondary | ICD-10-CM | POA: Insufficient documentation

## 2014-07-18 MED ORDER — KETAMINE HCL 10 MG/ML IJ SOLN
1.0000 mg/kg | Freq: Once | INTRAMUSCULAR | Status: AC
  Administered 2014-07-18: 60 mg via INTRAVENOUS

## 2014-07-18 MED ORDER — KETAMINE HCL 10 MG/ML IJ SOLN
INTRAMUSCULAR | Status: AC
  Filled 2014-07-18: qty 1

## 2014-07-18 MED ORDER — SODIUM CHLORIDE 0.9 % IV BOLUS (SEPSIS)
1000.0000 mL | Freq: Once | INTRAVENOUS | Status: AC
  Administered 2014-07-18: 1000 mL via INTRAVENOUS

## 2014-07-18 MED ORDER — DIPHENHYDRAMINE HCL 50 MG/ML IJ SOLN
25.0000 mg | Freq: Once | INTRAMUSCULAR | Status: AC
  Administered 2014-07-18: 25 mg via INTRAVENOUS
  Filled 2014-07-18: qty 1

## 2014-07-18 MED ORDER — DEXAMETHASONE SODIUM PHOSPHATE 4 MG/ML IJ SOLN
INTRAMUSCULAR | Status: AC
  Filled 2014-07-18: qty 3

## 2014-07-18 MED ORDER — METOCLOPRAMIDE HCL 5 MG/ML IJ SOLN
10.0000 mg | Freq: Once | INTRAMUSCULAR | Status: AC
  Administered 2014-07-18: 10 mg via INTRAVENOUS
  Filled 2014-07-18: qty 2

## 2014-07-18 MED ORDER — DEXAMETHASONE SODIUM PHOSPHATE 10 MG/ML IJ SOLN: 10.0000 mg | Freq: Once | INTRAMUSCULAR | Status: DC

## 2014-07-18 MED ORDER — DEXAMETHASONE SODIUM PHOSPHATE 10 MG/ML IJ SOLN: 8.0000 mg | Freq: Once | INTRAMUSCULAR | Status: AC

## 2014-07-18 NOTE — Discharge Instructions (Signed)
Migraine Headache °A migraine headache is an intense, throbbing pain on one or both sides of your head. A migraine can last for 30 minutes to several hours. °CAUSES  °The exact cause of a migraine headache is not always known. However, a migraine may be caused when nerves in the brain become irritated and release chemicals that cause inflammation. This causes pain. °Certain things may also trigger migraines, such as: °· Alcohol. °· Smoking. °· Stress. °· Menstruation. °· Aged cheeses. °· Foods or drinks that contain nitrates, glutamate, aspartame, or tyramine. °· Lack of sleep. °· Chocolate. °· Caffeine. °· Hunger. °· Physical exertion. °· Fatigue. °· Medicines used to treat chest pain (nitroglycerine), birth control pills, estrogen, and some blood pressure medicines. °SIGNS AND SYMPTOMS °· Pain on one or both sides of your head. °· Pulsating or throbbing pain. °· Severe pain that prevents daily activities. °· Pain that is aggravated by any physical activity. °· Nausea, vomiting, or both. °· Dizziness. °· Pain with exposure to bright lights, loud noises, or activity. °· General sensitivity to bright lights, loud noises, or smells. °Before you get a migraine, you may get warning signs that a migraine is coming (aura). An aura may include: °· Seeing flashing lights. °· Seeing bright spots, halos, or zigzag lines. °· Having tunnel vision or blurred vision. °· Having feelings of numbness or tingling. °· Having trouble talking. °· Having muscle weakness. °DIAGNOSIS  °A migraine headache is often diagnosed based on: °· Symptoms. °· Physical exam. °· A CT scan or MRI of your head. These imaging tests cannot diagnose migraines, but they can help rule out other causes of headaches. °TREATMENT °Medicines may be given for pain and nausea. Medicines can also be given to help prevent recurrent migraines.  °HOME CARE INSTRUCTIONS °· Only take over-the-counter or prescription medicines for pain or discomfort as directed by your  health care provider. The use of long-term narcotics is not recommended. °· Lie down in a dark, quiet room when you have a migraine. °· Keep a journal to find out what may trigger your migraine headaches. For example, write down: °¨ What you eat and drink. °¨ How much sleep you get. °¨ Any change to your diet or medicines. °· Limit alcohol consumption. °· Quit smoking if you smoke. °· Get 7-9 hours of sleep, or as recommended by your health care provider. °· Limit stress. °· Keep lights dim if bright lights bother you and make your migraines worse. °SEEK IMMEDIATE MEDICAL CARE IF:  °· Your migraine becomes severe. °· You have a fever. °· You have a stiff neck. °· You have vision loss. °· You have muscular weakness or loss of muscle control. °· You start losing your balance or have trouble walking. °· You feel faint or pass out. °· You have severe symptoms that are different from your first symptoms. °MAKE SURE YOU:  °· Understand these instructions. °· Will watch your condition. °· Will get help right away if you are not doing well or get worse. °Document Released: 11/04/2005 Document Revised: 03/21/2014 Document Reviewed: 07/12/2013 °Exit[employer]® Patient Information ©2015 [employer]. This information is not intended to replace advice given to you by your health care provider. Make sure you discuss any questions you have with your health care provider. ° °Recurrent Migraine Headache °A migraine headache is an intense, throbbing pain on one or both sides of your head. Recurrent migraines keep coming back. A migraine can last for 30 minutes to several hours. °CAUSES  °The exact cause   of a migraine headache is not always known. However, a migraine may be caused when nerves in the brain become irritated and release chemicals that cause inflammation. This causes pain. °Certain things may also trigger migraines, such as:  °· Alcohol. °· Smoking. °· Stress. °· Menstruation. °· Aged cheeses. °· Foods or drinks that contain  nitrates, glutamate, aspartame, or tyramine. °· Lack of sleep. °· Chocolate. °· Caffeine. °· Hunger. °· Physical exertion. °· Fatigue. °· Medicines used to treat chest pain (nitroglycerine), birth control pills, estrogen, and some blood pressure medicines. °SYMPTOMS  °· Pain on one or both sides of your head. °· Pulsating or throbbing pain. °· Severe pain that prevents daily activities. °· Pain that is aggravated by any physical activity. °· Nausea, vomiting, or both. °· Dizziness. °· Pain with exposure to bright lights, loud noises, or activity. °· General sensitivity to bright lights, loud noises, or smells. °Before you get a migraine, you may get warning signs that a migraine is coming (aura). An aura may include: °· Seeing flashing lights. °· Seeing bright spots, halos, or zigzag lines. °· Having tunnel vision or blurred vision. °· Having feelings of numbness or tingling. °· Having trouble talking. °· Having muscle weakness. °DIAGNOSIS  °A recurrent migraine headache is often diagnosed based on: °· Symptoms. °· Physical examination. °· A CT scan or MRI of your head. These imaging tests cannot diagnose migraines but can help rule out other causes of headaches.   °TREATMENT  °Medicines may be given for pain and nausea. Medicines can also be given to help prevent recurrent migraines. °HOME CARE INSTRUCTIONS °· Only take over-the-counter or prescription medicines for pain or discomfort as directed by your health care provider. The use of long-term narcotics is not recommended. °· Lie down in a dark, quiet room when you have a migraine. °· Keep a journal to find out what may trigger your migraine headaches. For example, write down: °¨ What you eat and drink. °¨ How much sleep you get. °¨ Any change to your diet or medicines. °· Limit alcohol consumption. °· Quit smoking if you smoke. °· Get 7-9 hours of sleep, or as recommended by your health care provider. °· Limit stress. °· Keep lights dim if bright lights bother  you and make your migraines worse. °SEEK MEDICAL CARE IF:  °· You do not get relief from the medicines given to you. °· You have a recurrence of pain. °· You have a fever. °SEEK IMMEDIATE MEDICAL CARE IF: °· Your migraine becomes severe. °· You have a stiff neck. °· You have loss of vision. °· You have muscular weakness or loss of muscle control. °· You start losing your balance or have trouble walking. °· You feel faint or pass out. °· You have severe symptoms that are different from your first symptoms. °MAKE SURE YOU:  °· Understand these instructions. °· Will watch your condition. °· Will get help right away if you are not doing well or get worse. °Document Released: 07/30/2001 Document Revised: 03/21/2014 Document Reviewed: 07/12/2013 °ExitCare® Patient Information ©2015 [employer]. This information is not intended to replace advice given to you by your health care provider. Make sure you discuss any questions you have with your health care provider. ° °

## 2014-07-18 NOTE — ED Provider Notes (Signed)
Medical screening examination/treatment/procedure(s) were conducted as a shared visit with non-physician practitioner(s) and myself.  I personally evaluated the patient during the encounter.   EKG Interpretation None      Cassidy Miles is a 48 y.o. female hx of migraines here with worsening headache. Worsening headache for several days. Denies fevers. Tried taking topamax and Zomig and motrin with no relief. Not sudden onset headache. Has mild photophobia on exam. Neuro exam unremarkable. Upon review of records, frequently ask for dilaudid during previous visits. She ask me to evaluate her since migraine cocktail didn't improve symptoms. I gave her some ketamine. Felt much better afterwards. She has neuro f/u. Stable for d/c.    Richardean Canal, MD 07/18/14 [phone]

## 2014-07-18 NOTE — ED Provider Notes (Signed)
CSN: [id number]     Arrival date & time 07/18/14  0904 History   First MD Initiated Contact with Patient 07/18/14 [phone]     Chief Complaint  Patient presents with  . Migraine     (Consider location/radiation/quality/duration/timing/severity/associated sxs/prior Treatment) HPI Comments: Patient is a 48 year old female past medical history of GERD, IBS, migraines, hypercholesterolemia, anxiety, depression and panic attacks who presents to the emergency department complaining of a migraine headache x4 days. Pt reports headache was gradual onset and has remained constant, beginning on the right side of her head radiating through like a "jackhammer". States this feels exactly the same as her prior migraines. She has tried taking Topamax and Zomig along with ibuprofen with no relief. Admits to associated photophobia, phonophobia, nausea and 5 episodes of nonbloody, nonbilious emesis. Denies fever, chills, neck pain or stiffness. She saw her neurologist a few months back and there were no issues at that time. She currently does not have a PCP.  Patient is a 48 y.o. female presenting with migraines. The history is provided by the patient.  Migraine Associated symptoms include headaches, nausea and vomiting. Pertinent negatives include no numbness or weakness.    Past Medical History  Diagnosis Date  . GERD (gastroesophageal reflux disease)   . IBS (irritable bowel syndrome)   . Migraines   . Hypercholesteremia   . Anxiety and depression   . Panic attack    History reviewed. No pertinent past surgical history. Family History  Problem Relation Age of Onset  . Alzheimer's disease Mother    History  Substance Use Topics  . Smoking status: Current Every Day Smoker -- 0.50 packs/day    Types: Cigarettes  . Smokeless tobacco: Current User  . Alcohol Use: 0.0 oz/week     Comment: occ   OB History   Grav Para Term Preterm Abortions TAB SAB Ect Mult Living                 Review of Systems   Eyes: Positive for photophobia.  Gastrointestinal: Positive for nausea and vomiting.  Neurological: Positive for headaches. Negative for dizziness, syncope, weakness, light-headedness and numbness.  All other systems reviewed and are negative.     Allergies  Cephalexin; Clarithromycin; Fioricet; Penicillins; and Tramadol  Home Medications   Prior to Admission medications   Medication Sig Start Date End Date Taking? Authorizing Provider  ibuprofen (ADVIL,MOTRIN) 200 MG tablet Take 400 mg by mouth every 6 (six) hours as needed (pain).    Yes Historical Provider, MD  topiramate (TOPAMAX) 100 MG tablet Take 100 mg by mouth 2 (two) times daily.   Yes Historical Provider, MD  zolmitriptan (ZOMIG) 5 MG tablet Take 5 mg by mouth as needed for migraine.   Yes Historical Provider, MD  divalproex (DEPAKOTE) 125 MG DR tablet Take 125 mg by mouth 3 (three) times daily.    Historical Provider, MD   BP 125/70  Pulse 94  Temp(Src) 99.4 F (37.4 C) (Oral)  Resp 18  SpO2 100%  LMP 07/04/2014 Physical Exam  Nursing note and vitals reviewed. Constitutional: She is oriented to person, place, and time. She appears well-developed and well-nourished. No distress.  HENT:  Head: Normocephalic and atraumatic.  Mouth/Throat: Oropharynx is clear and moist.  Eyes: Conjunctivae and EOM are normal. Pupils are equal, round, and reactive to light.  Neck: Normal range of motion. Neck supple.  No meningeal signs.  Cardiovascular: Regular rhythm, normal heart sounds and intact distal pulses.   Tachycardic.  Pulmonary/Chest:  Effort normal and breath sounds normal. No respiratory distress.  Abdominal: Soft. Bowel sounds are normal. There is no tenderness.  Musculoskeletal: Normal range of motion. She exhibits no edema.  Neurological: She is alert and oriented to person, place, and time. She has normal strength. No cranial nerve deficit or sensory deficit. Coordination and gait normal.  Speech fluent, goal  oriented. Moves limbs without ataxia. Equal grip strength bilateral.  Skin: Skin is warm and dry. No rash noted. She is not diaphoretic.  Psychiatric: She has a normal mood and affect. Her behavior is normal.    ED Course  Procedures (including critical care time) Labs Review Labs Reviewed - No data to display  Imaging Review No results found.   EKG Interpretation None      MDM   Final diagnoses:  Migraine without aura and with status migrainosus, not intractable   Patient presenting with headache, the same as her typical migraines. She is nontoxic appearing and in no apparent distress. Tachycardic, vitals otherwise stable. No red flags concerning patient's headache. No neck pain or stiffness, no sudden onset, thunderclap in nature, doubt meningitis, subarachnoid hemorrhage or intracranial hemorrhage. Plan to give migraine cocktail and reassess. It is noted in patient's chart that at her prior visits she requests 1-2 mg of IV Dilaudid. At her last visit she did not receive this treatment and got angry and left, shortly returning requesting migraine cocktail. 11:51 AM No improvement with initial migraine cocktail. Pt requested to see the physician rather than the physician assistant, Dr. Silverio Lay went to see patient who agreed that no narcotic pain medication was necessary at this time for treatment of the headache. He gave patient ketamine with great resolution of her headache. On reassessment, she is well-appearing in no apparent distress and states she's ready to go home. Stable for discharge. Return precautions given. Patient states understanding of treatment care plan and is agreeable.  Trevor Mace, PA-C 07/18/14 1153

## 2014-07-18 NOTE — ED Notes (Signed)
Patient is not in her room for reassessment.  Searched the department and lobby and patient was not found in the department.  Dr. Silverio Lay notified.  Patient was observed leaving the building unattended.  Patient had been advised she could not drive herself home after receiving medications.

## 2014-07-18 NOTE — ED Notes (Signed)
C/o migraine x 4 days, n/v sensitive to light and sound.

## 2014-07-18 NOTE — ED Notes (Signed)
IV access attempted x3 without success.  RN aware.

## 2014-10-06 ENCOUNTER — Encounter (HOSPITAL_BASED_OUTPATIENT_CLINIC_OR_DEPARTMENT_OTHER): Payer: Self-pay | Admitting: Emergency Medicine

## 2014-10-06 ENCOUNTER — Emergency Department (HOSPITAL_BASED_OUTPATIENT_CLINIC_OR_DEPARTMENT_OTHER)
Admission: EM | Admit: 2014-10-06 | Discharge: 2014-10-06 | Disposition: A | Discharge status: Home / Self Care | Payer: Medicaid Other | Attending: Emergency Medicine | Admitting: Emergency Medicine

## 2014-10-06 DIAGNOSIS — Z720 Tobacco use: Secondary | ICD-10-CM | POA: Insufficient documentation

## 2014-10-06 DIAGNOSIS — F329 Major depressive disorder, single episode, unspecified: Secondary | ICD-10-CM | POA: Insufficient documentation

## 2014-10-06 DIAGNOSIS — Z8719 Personal history of other diseases of the digestive system: Secondary | ICD-10-CM | POA: Insufficient documentation

## 2014-10-06 DIAGNOSIS — Z880 Allergy status to penicillin: Secondary | ICD-10-CM | POA: Insufficient documentation

## 2014-10-06 DIAGNOSIS — R112 Nausea with vomiting, unspecified: Secondary | ICD-10-CM | POA: Insufficient documentation

## 2014-10-06 DIAGNOSIS — F419 Anxiety disorder, unspecified: Secondary | ICD-10-CM | POA: Insufficient documentation

## 2014-10-06 DIAGNOSIS — Z79899 Other long term (current) drug therapy: Secondary | ICD-10-CM | POA: Insufficient documentation

## 2014-10-06 DIAGNOSIS — G43009 Migraine without aura, not intractable, without status migrainosus: Secondary | ICD-10-CM

## 2014-10-06 DIAGNOSIS — G43909 Migraine, unspecified, not intractable, without status migrainosus: Secondary | ICD-10-CM | POA: Insufficient documentation

## 2014-10-06 MED ORDER — DIPHENHYDRAMINE HCL 50 MG/ML IJ SOLN
INTRAMUSCULAR | Status: AC
  Filled 2014-10-06: qty 1

## 2014-10-06 MED ORDER — METOCLOPRAMIDE HCL 5 MG/ML IJ SOLN
10.0000 mg | Freq: Once | INTRAMUSCULAR | Status: AC
  Administered 2014-10-06: 10 mg via INTRAVENOUS
  Filled 2014-10-06: qty 2

## 2014-10-06 MED ORDER — PROMETHAZINE HCL 25 MG/ML IJ SOLN
INTRAMUSCULAR | Status: AC
  Filled 2014-10-06: qty 1

## 2014-10-06 MED ORDER — DIPHENHYDRAMINE HCL 50 MG/ML IJ SOLN
25.0000 mg | Freq: Once | INTRAMUSCULAR | Status: AC
  Administered 2014-10-06: 25 mg via INTRAVENOUS
  Filled 2014-10-06: qty 1

## 2014-10-06 MED ORDER — KETOROLAC TROMETHAMINE 30 MG/ML IJ SOLN
30.0000 mg | Freq: Once | INTRAMUSCULAR | Status: AC
  Administered 2014-10-06: 30 mg via INTRAVENOUS
  Filled 2014-10-06: qty 1

## 2014-10-06 MED ORDER — PROMETHAZINE HCL 25 MG/ML IJ SOLN
12.5000 mg | Freq: Once | INTRAMUSCULAR | Status: AC
  Administered 2014-10-06: 12.5 mg via INTRAVENOUS
  Filled 2014-10-06: qty 1

## 2014-10-06 MED ORDER — DEXAMETHASONE SODIUM PHOSPHATE 10 MG/ML IJ SOLN
10.0000 mg | Freq: Once | INTRAMUSCULAR | Status: AC
  Administered 2014-10-06: 10 mg via INTRAVENOUS
  Filled 2014-10-06: qty 1

## 2014-10-06 NOTE — ED Provider Notes (Signed)
CSN: [id number]     Arrival date & time 10/06/14  0908 History   First MD Initiated Contact with Patient 10/06/14 0932     Chief Complaint  Patient presents with  . Migraine     (Consider location/radiation/quality/duration/timing/severity/associated sxs/prior Treatment) HPI Comments: Patient presents with a migraine. She has long-standing history of migraines. She sees a neurologist with [facility] neurology. She states over the last 4 days she's had worsening pain consistent with her past migraines. The pain started on the right side of her head and is now all over her head. She denies any neck pain. She's had some associated nausea and vomiting with photophobia. She denies any fevers or chills. She states the pain is the same she's had before with her migraines. She's taken Zomig at home without relief. She was previously on Topamax but hasn't been able to afford this for the last few months. She does report that she does not improve after migraine cocktails. She's in the past been given Dilaudid. However on her last visit she's been instructed that this is not appropriate treatment for her migraines. She was given ketamine which she does note improved her migraines significantly.  Patient is a 48 y.o. female presenting with migraines.  Migraine Associated symptoms include headaches. Pertinent negatives include no chest pain, no abdominal pain and no shortness of breath.    Past Medical History  Diagnosis Date  . GERD (gastroesophageal reflux disease)   . IBS (irritable bowel syndrome)   . Migraines   . Hypercholesteremia   . Anxiety and depression   . Panic attack    History reviewed. No pertinent past surgical history. Family History  Problem Relation Age of Onset  . Alzheimer's disease Mother    History  Substance Use Topics  . Smoking status: Current Every Day Smoker -- 0.50 packs/day    Types: Cigarettes  . Smokeless tobacco: Current User  . Alcohol Use: 0.0 oz/week   Comment: occ   OB History    No data available     Review of Systems  Constitutional: Negative for fever, chills, diaphoresis and fatigue.  HENT: Negative for congestion, rhinorrhea and sneezing.   Eyes: Positive for photophobia.  Respiratory: Negative for cough, chest tightness and shortness of breath.   Cardiovascular: Negative for chest pain and leg swelling.  Gastrointestinal: Positive for nausea and vomiting. Negative for abdominal pain, diarrhea and blood in stool.  Genitourinary: Negative for frequency, hematuria, flank pain and difficulty urinating.  Musculoskeletal: Negative for back pain and arthralgias.  Skin: Negative for rash.  Neurological: Positive for headaches. Negative for dizziness, speech difficulty, weakness and numbness.      Allergies  Cephalexin; Clarithromycin; Fioricet; Penicillins; and Tramadol  Home Medications   Prior to Admission medications   Medication Sig Start Date End Date Taking? Authorizing Provider  ibuprofen (ADVIL,MOTRIN) 200 MG tablet Take 400 mg by mouth every 6 (six) hours as needed (pain).    Yes Historical Provider, MD  topiramate (TOPAMAX) 100 MG tablet Take 100 mg by mouth 2 (two) times daily.    Historical Provider, MD  zolmitriptan (ZOMIG) 5 MG tablet Take 5 mg by mouth as needed for migraine.    Historical Provider, MD   BP 128/78 mmHg  Pulse 94  Temp(Src) 99.8 F (37.7 C) (Oral)  Resp 16  Ht 5\' 5"  (1.651 m)  Wt 128 lb (58.06 kg)  BMI 21.30 kg/m2  SpO2 100%  LMP 09/12/2014 Physical Exam  Constitutional: She is oriented to person, place,  and time. She appears well-developed and well-nourished.  HENT:  Head: Normocephalic and atraumatic.  Mouth/Throat: Oropharynx is clear and moist.  Eyes: Pupils are equal, round, and reactive to light.  Normal fundi  Neck: Normal range of motion. Neck supple.  Cardiovascular: Normal rate, regular rhythm and normal heart sounds.   Pulmonary/Chest: Effort normal and breath sounds  normal. No respiratory distress. She has no wheezes. She has no rales. She exhibits no tenderness.  Abdominal: Soft. Bowel sounds are normal. There is no tenderness. There is no rebound and no guarding.  Musculoskeletal: Normal range of motion. She exhibits no edema.  Lymphadenopathy:    She has no cervical adenopathy.  Neurological: She is alert and oriented to person, place, and time. She has normal strength. No cranial nerve deficit or sensory deficit. GCS eye subscore is 4. GCS verbal subscore is 5. GCS motor subscore is 6.  Finger to nose intact, no pronator drift  Skin: Skin is warm and dry. No rash noted.  Psychiatric: She has a normal mood and affect.    ED Course  Procedures (including critical care time) Labs Review Labs Reviewed - No data to display  Imaging Review No results found.   EKG Interpretation None      MDM   Final diagnoses:  Nonintractable migraine, unspecified migraine type    Pt presents with a migraine.  She says that it is the same type of pain as with her prior migraines.  No unusual symptoms that would be more suggestive of SAH or meningitis.  Pt was very adamant about the migraine cocktail not working for her headaches.  She has previously had multiple visits for migraines and has had discussions with the EDP that narcotics are not appropriate for ongoing treatment of her migraines.  I explained that again today.  She did receive the migraine cocktail without improvement.  I also gave her toradol and tried phenergan as an adjunct to the other treatments.  She still was not happy with the lack of improvement in symptoms.  I offered to give her another dose of phenergan, but she refused.  She was discharged and encouraged to have close f/u with her neurologist.    Rolan BuccoMelanie Khamiya Varin, MD 10/06/14 [phone]

## 2014-10-06 NOTE — ED Notes (Signed)
Pt is angry, expresses to this rn that her headache is no better, and that she feels no one here cares about her. Explained many er physicians do not use narcotics for migraine headaches. Dr. Fredderick Phenix has spoken with pt at length regarding not using dilaudid or ketamine for her headache. Pt states she is angry and not happy with her care. Advised pt to see her neurologist asap to make a long term plan for treating her headaches. Iv removed at pt request, pt refuses vital signs, refuses to sign discharge. Pt grabs her purse and leaves ER.

## 2014-10-06 NOTE — Discharge Instructions (Signed)

## 2014-10-06 NOTE — ED Notes (Signed)
Pt having migraine x 4 days.  Similar to previous migraines.  Some light and sound sensitivity.    Some N/V.  No known fever.

## 2014-11-10 ENCOUNTER — Emergency Department (HOSPITAL_BASED_OUTPATIENT_CLINIC_OR_DEPARTMENT_OTHER)
Admission: EM | Admit: 2014-11-10 | Discharge: 2014-11-10 | Disposition: A | Discharge status: Home / Self Care | Payer: Medicaid Other | Attending: Emergency Medicine | Admitting: Emergency Medicine

## 2014-11-10 ENCOUNTER — Encounter (HOSPITAL_BASED_OUTPATIENT_CLINIC_OR_DEPARTMENT_OTHER): Payer: Self-pay | Admitting: *Deleted

## 2014-11-10 DIAGNOSIS — F410 Panic disorder [episodic paroxysmal anxiety] without agoraphobia: Secondary | ICD-10-CM | POA: Insufficient documentation

## 2014-11-10 DIAGNOSIS — Z79899 Other long term (current) drug therapy: Secondary | ICD-10-CM | POA: Insufficient documentation

## 2014-11-10 DIAGNOSIS — Z720 Tobacco use: Secondary | ICD-10-CM | POA: Insufficient documentation

## 2014-11-10 DIAGNOSIS — Z880 Allergy status to penicillin: Secondary | ICD-10-CM | POA: Insufficient documentation

## 2014-11-10 DIAGNOSIS — F329 Major depressive disorder, single episode, unspecified: Secondary | ICD-10-CM | POA: Insufficient documentation

## 2014-11-10 DIAGNOSIS — Z8639 Personal history of other endocrine, nutritional and metabolic disease: Secondary | ICD-10-CM | POA: Insufficient documentation

## 2014-11-10 DIAGNOSIS — G43001 Migraine without aura, not intractable, with status migrainosus: Secondary | ICD-10-CM | POA: Insufficient documentation

## 2014-11-10 DIAGNOSIS — Z8719 Personal history of other diseases of the digestive system: Secondary | ICD-10-CM | POA: Insufficient documentation

## 2014-11-10 DIAGNOSIS — R000 Tachycardia, unspecified: Secondary | ICD-10-CM | POA: Insufficient documentation

## 2014-11-10 MED ORDER — DIPHENHYDRAMINE HCL 50 MG/ML IJ SOLN
25.0000 mg | Freq: Once | INTRAMUSCULAR | Status: AC
  Administered 2014-11-10: 25 mg via INTRAVENOUS
  Filled 2014-11-10: qty 1

## 2014-11-10 MED ORDER — ZOLMITRIPTAN 5 MG PO TABS: 5.0000 mg | ORAL_TABLET | ORAL | Status: DC | PRN

## 2014-11-10 MED ORDER — KETAMINE HCL 10 MG/ML IJ SOLN
0.5000 mg/kg | Freq: Once | INTRAMUSCULAR | Status: AC
  Administered 2014-11-10: 29 mg via INTRAVENOUS
  Filled 2014-11-10: qty 1

## 2014-11-10 MED ORDER — METOCLOPRAMIDE HCL 5 MG/ML IJ SOLN
10.0000 mg | Freq: Once | INTRAMUSCULAR | Status: AC
  Administered 2014-11-10: 10 mg via INTRAVENOUS
  Filled 2014-11-10: qty 2

## 2014-11-10 MED ORDER — TOPIRAMATE 100 MG PO TABS: 100.0000 mg | ORAL_TABLET | Freq: Two times a day (BID) | ORAL | Status: DC

## 2014-11-10 MED ORDER — SODIUM CHLORIDE 0.9 % IV BOLUS (SEPSIS)
1000.0000 mL | Freq: Once | INTRAVENOUS | Status: AC
  Administered 2014-11-10: 1000 mL via INTRAVENOUS

## 2014-11-10 NOTE — ED Notes (Signed)
Update to Dr. Anitra Lauth on patients response to pain medication.

## 2014-11-10 NOTE — Discharge Instructions (Signed)
Recurrent Migraine Headache °A migraine headache is very bad, throbbing pain on one or both sides of your head. Recurrent migraines keep coming back. Talk to your doctor about what things may bring on (trigger) your migraine headaches. °HOME CARE °· Only take medicines as told by your doctor. °· Lie down in a dark, quiet room when you have a migraine. °· Keep a journal to find out if certain things bring on migraine headaches. For example, write down: °¨ What you eat and drink. °¨ How much sleep you get. °¨ Any change to your diet or medicines. °· Lessen how much alcohol you drink. °· Quit smoking if you smoke. °· Get enough sleep. °· Lessen any stress in your life. °· Keep lights dim if bright lights bother you or make your migraines worse. °GET HELP IF: °· Medicine does not help your migraines. °· Your pain keeps coming back. °· You have a fever. °GET HELP RIGHT AWAY IF:  °· Your migraine becomes really bad. °· You have a stiff neck. °· You have trouble seeing. °· Your muscles are weak, or you lose muscle control. °· You lose your balance or have trouble walking. °· You feel like you will pass out (faint), or you pass out. °· You have really bad symptoms that are different than your first symptoms. °MAKE SURE YOU:  °· Understand these instructions. °· Will watch your condition. °· Will get help right away if you are not doing well or get worse. °Document Released: 08/13/2008 Document Revised: 11/09/2013 Document Reviewed: 07/12/2013 °Exit[employer]® Patient Information ©2015 [employer]. This information is not intended to replace advice given to you by your health care provider. Make sure you discuss any questions you have with your health care provider. ° °

## 2014-11-10 NOTE — ED Provider Notes (Signed)
CSN: [id number]     Arrival date & time 11/10/14  [id number] History   None    No chief complaint on file.    (Consider location/radiation/quality/duration/timing/severity/associated sxs/prior Treatment) HPI Comments: Pt is normally on topamax and usually has zomig however she ran out 2 weeks ago and next appt with provider is in [place].  Patient is a 48 y.o. female presenting with migraines. The history is provided by the patient.  Migraine This is a chronic problem. Episode onset: 3 days. The problem occurs constantly. The problem has been gradually worsening. Associated symptoms include headaches. Pertinent negatives include no abdominal pain and no shortness of breath. Associated symptoms comments: Nausea and vomiting.  No fever, neck pain or blurry vision.  No focal neuro deficits. Exacerbated by: bright light and loud noise. Nothing relieves the symptoms. She has tried acetaminophen (advil) for the symptoms. The treatment provided no relief.    Past Medical History  Diagnosis Date  . GERD (gastroesophageal reflux disease)   . IBS (irritable bowel syndrome)   . Migraines   . Hypercholesteremia   . Anxiety and depression   . Panic attack    No past surgical history on file. Family History  Problem Relation Age of Onset  . Alzheimer's disease Mother    History  Substance Use Topics  . Smoking status: Current Every Day Smoker -- 0.50 packs/day    Types: Cigarettes  . Smokeless tobacco: Current User  . Alcohol Use: 0.0 oz/week     Comment: occ   OB History    No data available     Review of Systems  Constitutional: Negative for fever and chills.  Eyes: Positive for photophobia. Negative for visual disturbance.  Respiratory: Negative for chest tightness and shortness of breath.   Gastrointestinal: Positive for nausea and vomiting. Negative for abdominal pain.  Neurological: Positive for headaches. Negative for weakness and numbness.  All other systems reviewed and are  negative.     Allergies  Cephalexin; Clarithromycin; Fioricet; Penicillins; and Tramadol  Home Medications   Prior to Admission medications   Medication Sig Start Date End Date Taking? Authorizing Provider  ibuprofen (ADVIL,MOTRIN) 200 MG tablet Take 400 mg by mouth every 6 (six) hours as needed (pain).     Historical Provider, MD  topiramate (TOPAMAX) 100 MG tablet Take 100 mg by mouth 2 (two) times daily.    Historical Provider, MD  zolmitriptan (ZOMIG) 5 MG tablet Take 5 mg by mouth as needed for migraine.    Historical Provider, MD   BP 159/84 mmHg  Pulse 128  Temp(Src) 99.7 F (37.6 C) (Oral)  Resp 22  Ht 5\' 5"  (1.651 m)  Wt 128 lb (58.06 kg)  BMI 21.30 kg/m2  SpO2 98% Physical Exam  Constitutional: She is oriented to person, place, and time. She appears well-developed and well-nourished. She appears distressed.  Appears uncomfortable  HENT:  Head: Normocephalic and atraumatic.  Eyes: EOM are normal. Pupils are equal, round, and reactive to light.  Fundoscopic exam:      The right eye shows no papilledema.       The left eye shows no papilledema.  Neck: Normal range of motion. Neck supple.  Cardiovascular: Regular rhythm, normal heart sounds and intact distal pulses.  Tachycardia present.  Exam reveals no friction rub.   No murmur heard. Pulmonary/Chest: Effort normal and breath sounds normal. She has no wheezes. She has no rales.  Abdominal: Soft. Bowel sounds are normal. She exhibits no distension. There is no  tenderness. There is no rebound and no guarding.  Musculoskeletal: Normal range of motion. She exhibits no tenderness.  No edema  Lymphadenopathy:    She has no cervical adenopathy.  Neurological: She is alert and oriented to person, place, and time. She has normal strength. No cranial nerve deficit or sensory deficit. Coordination and gait normal.  photophobia  Skin: Skin is warm and dry. No rash noted.  Psychiatric: She has a normal mood and affect. Her  behavior is normal.  Nursing note and vitals reviewed.   ED Course  Procedures (including critical care time) Labs Review Labs Reviewed - No data to display  Imaging Review No results found.   EKG Interpretation None      MDM   Final diagnoses:  Migraine without aura and with status migrainosus, not intractable    Pt with typical migraine HA without sx suggestive of SAH(sudden onset, worst of life, or deficits), infection, or cavernous vein thrombosis.  Normal neuro exam.  Pt is currently out of her topamax, zomig currently and next appt to get them filled is in [place].  In the past pt states HA cocktails have not been helpful and she is requesting ketamine which she received in aug with improvement in her headache.   Will give reglan and benadryl however will also give subdissociative dose of ketamine at 0.5mg /kg.   Pt states headache is not improved with above meds.  Offered imitrex but she refused.  Pt states she has to go because her husband is angry.  She was given ppx for her migraine meds.  She was able to ambulate without difficulty and VS are improved.    Gwyneth Sprout, MD 11/10/14 [phone]

## 2014-11-10 NOTE — ED Notes (Signed)
Patient called nurse to the room and stated she has to leave right now.  Patient has been arguing with someone on her telephone.

## 2014-11-10 NOTE — ED Notes (Signed)
Complaints of migraine for the last three days, associated with nausea and vomiting.  Has been out of her topamax and zomig for about one week.

## 2014-12-13 ENCOUNTER — Encounter: Payer: Self-pay | Admitting: General Practice

## 2015-01-05 ENCOUNTER — Emergency Department (HOSPITAL_BASED_OUTPATIENT_CLINIC_OR_DEPARTMENT_OTHER)
Admission: EM | Admit: 2015-01-05 | Discharge: 2015-01-05 | Disposition: A | Discharge status: Home / Self Care | Payer: Medicaid Other | Attending: Emergency Medicine | Admitting: Emergency Medicine

## 2015-01-05 ENCOUNTER — Encounter (HOSPITAL_BASED_OUTPATIENT_CLINIC_OR_DEPARTMENT_OTHER): Payer: Self-pay | Admitting: *Deleted

## 2015-01-05 DIAGNOSIS — Z720 Tobacco use: Secondary | ICD-10-CM | POA: Insufficient documentation

## 2015-01-05 DIAGNOSIS — Z8639 Personal history of other endocrine, nutritional and metabolic disease: Secondary | ICD-10-CM | POA: Insufficient documentation

## 2015-01-05 DIAGNOSIS — Z8719 Personal history of other diseases of the digestive system: Secondary | ICD-10-CM | POA: Insufficient documentation

## 2015-01-05 DIAGNOSIS — G43011 Migraine without aura, intractable, with status migrainosus: Secondary | ICD-10-CM | POA: Insufficient documentation

## 2015-01-05 DIAGNOSIS — Z8659 Personal history of other mental and behavioral disorders: Secondary | ICD-10-CM | POA: Insufficient documentation

## 2015-01-05 DIAGNOSIS — Z880 Allergy status to penicillin: Secondary | ICD-10-CM | POA: Insufficient documentation

## 2015-01-05 DIAGNOSIS — Z79899 Other long term (current) drug therapy: Secondary | ICD-10-CM | POA: Insufficient documentation

## 2015-01-05 MED ORDER — ONDANSETRON HCL 4 MG/2ML IJ SOLN
4.0000 mg | Freq: Once | INTRAMUSCULAR | Status: AC
  Administered 2015-01-05: 4 mg via INTRAVENOUS
  Filled 2015-01-05: qty 2

## 2015-01-05 MED ORDER — SODIUM CHLORIDE 0.9 % IV BOLUS (SEPSIS)
1000.0000 mL | Freq: Once | INTRAVENOUS | Status: AC
  Administered 2015-01-05: 1000 mL via INTRAVENOUS

## 2015-01-05 MED ORDER — PROCHLORPERAZINE EDISYLATE 5 MG/ML IJ SOLN
10.0000 mg | Freq: Once | INTRAMUSCULAR | Status: AC
  Administered 2015-01-05: 10 mg via INTRAVENOUS
  Filled 2015-01-05: qty 2

## 2015-01-05 MED ORDER — KETAMINE HCL 10 MG/ML IJ SOLN
0.3000 mg/kg | Freq: Once | INTRAMUSCULAR | Status: AC
  Administered 2015-01-05: 18 mg via INTRAVENOUS
  Filled 2015-01-05: qty 1

## 2015-01-05 MED ORDER — SODIUM CHLORIDE 0.9 % IV SOLN
Freq: Once | INTRAVENOUS | Status: AC
  Administered 2015-01-05: 13:00:00 via INTRAVENOUS

## 2015-01-05 NOTE — ED Provider Notes (Signed)
CSN: [id number]     Arrival date & time 01/05/15  2130 History   First MD Initiated Contact with Patient 01/05/15 0957     Chief Complaint  Patient presents with  . Headache     (Consider location/radiation/quality/duration/timing/severity/associated sxs/prior Treatment) HPI Comments: Patient presents with a headache. She has a history of migraines. She states she's had a headache for about 4 days. She states it feels a little different than her normal migraine but not unusual for her other type headaches. She states she has had similar headaches in the past. She describes pain all over her head. She states it started gradually and has been getting worse over the last few days. She denies any neck pain. She denies any fevers. She has had some nausea and vomiting and photophobia which is typical for her headaches.  Patient is a 49 y.o. female presenting with headaches.  Headache Associated symptoms: nausea, photophobia and vomiting   Associated symptoms: no abdominal pain, no back pain, no congestion, no cough, no diarrhea, no dizziness, no fatigue, no fever, no numbness and no weakness     Past Medical History  Diagnosis Date  . GERD (gastroesophageal reflux disease)   . IBS (irritable bowel syndrome)   . Migraines   . Hypercholesteremia   . Anxiety and depression   . Panic attack    Past Surgical History  Procedure Laterality Date  . Tonsillectomy     Family History  Problem Relation Age of Onset  . Alzheimer's disease Mother    History  Substance Use Topics  . Smoking status: Current Every Day Smoker -- 0.50 packs/day    Types: Cigarettes  . Smokeless tobacco: Never Used  . Alcohol Use: 0.0 oz/week     Comment: occ   OB History    No data available     Review of Systems  Constitutional: Negative for fever, chills, diaphoresis and fatigue.  HENT: Negative for congestion, rhinorrhea and sneezing.   Eyes: Positive for photophobia.  Respiratory: Negative for cough,  chest tightness and shortness of breath.   Cardiovascular: Negative for chest pain and leg swelling.  Gastrointestinal: Positive for nausea and vomiting. Negative for abdominal pain, diarrhea and blood in stool.  Genitourinary: Negative for frequency, hematuria, flank pain and difficulty urinating.  Musculoskeletal: Negative for back pain and arthralgias.  Skin: Negative for rash.  Neurological: Positive for headaches. Negative for dizziness, speech difficulty, weakness and numbness.      Allergies  Cephalexin; Clarithromycin; Fioricet; Penicillins; and Tramadol  Home Medications   Prior to Admission medications   Medication Sig Start Date End Date Taking? Authorizing Provider  ibuprofen (ADVIL,MOTRIN) 200 MG tablet Take 400 mg by mouth every 6 (six) hours as needed (pain).    Yes Historical Provider, MD  topiramate (TOPAMAX) 100 MG tablet Take 1 tablet (100 mg total) by mouth 2 (two) times daily. 11/10/14  Yes Gwyneth Sprout, MD  zolmitriptan (ZOMIG) 5 MG tablet Take 1 tablet (5 mg total) by mouth as needed for migraine. 11/10/14  Yes Gwyneth Sprout, MD   BP 131/81 mmHg  Pulse 90  Temp(Src) 98 F (36.7 C) (Oral)  Resp 24  Ht  (1.651 m)  Wt 130 lb (58.968 kg)  BMI 21.63 kg/m2  SpO2 100% Physical Exam  Constitutional: She is oriented to person, place, and time. She appears well-developed and well-nourished.  HENT:  Head: Normocephalic and atraumatic.  Eyes: Pupils are equal, round, and reactive to light.  Neck: Normal range of motion.  Neck supple.  No meningismus  Cardiovascular: Normal rate, regular rhythm and normal heart sounds.   Pulmonary/Chest: Effort normal and breath sounds normal. No respiratory distress. She has no wheezes. She has no rales. She exhibits no tenderness.  Abdominal: Soft. Bowel sounds are normal. There is no tenderness. There is no rebound and no guarding.  Musculoskeletal: Normal range of motion. She exhibits no edema.  Lymphadenopathy:     She has no cervical adenopathy.  Neurological: She is alert and oriented to person, place, and time. She has normal strength. No cranial nerve deficit or sensory deficit. GCS eye subscore is 4. GCS verbal subscore is 5. GCS motor subscore is 6.  FTN intact  Skin: Skin is warm and dry. No rash noted.  Psychiatric: She has a normal mood and affect.    ED Course  Procedures (including critical care time) Labs Review Labs Reviewed - No data to display  Imaging Review No results found.   EKG Interpretation None      MDM   Final diagnoses:  Intractable migraine without aura and with status migrainosus    Patient presents with a headache. She says it's a little different than her typical migraine but it is similar other headache she's had in the past. I tried to get her to characterize it better and I stressed that if it's a headache that she hasn't had before we worry about other things such as subarachnoid hemorrhage or meningitis and we would need to do more testing to look into these things although she says that she feels like it's similar to other headaches that she's had in the past. She has no meningismus. She has no fevers. She's neurologically intact. She is very challenging to treat her headaches. She's used opioids in the past that it works however I had a discussion with her as well as other ED physicians that we don't feel it's appropriate to use ongoing opioids for headache treatment.  She states that the migraine cocktail as well as phenergan don't work for her.  She has had success with ketamine in the past. I did give her a analgesic treatment dose of ketamine at 0.3 mg/kg.she did not have much success with headache resolution with this as well. I then gave her a dose of Compazine and after that she said she was ready to go because her husband had to drive her home. I encouraged her to follow-up with her neurologist and work out a treatment plan for ED management of her  headaches.    Rolan Bucco, MD 01/05/15 [phone]

## 2015-01-05 NOTE — ED Notes (Signed)
Pt d/c home with ride- ambulatory with steady gait- note for work given

## 2015-01-05 NOTE — ED Notes (Signed)
Dr Fredderick Phenix notified pt reports no relief after ketamine and wishes to speak with her

## 2015-01-05 NOTE — ED Notes (Signed)
Pt requesting to speak to primary RN. Will make Joss, RN aware.

## 2015-01-05 NOTE — Discharge Instructions (Signed)

## 2015-01-05 NOTE — ED Notes (Signed)
Reports headache x 4 days- has hx of mha but states this feels different- c/o nv/ x 4 since 0700 and light sensitivity

## 2015-01-18 ENCOUNTER — Encounter: Payer: Self-pay | Admitting: General Practice

## 2015-02-20 ENCOUNTER — Encounter: Payer: Self-pay | Admitting: General Practice

## 2016-08-15 ENCOUNTER — Emergency Department (HOSPITAL_BASED_OUTPATIENT_CLINIC_OR_DEPARTMENT_OTHER)
Admission: EM | Admit: 2016-08-15 | Discharge: 2016-08-15 | Disposition: A | Discharge status: Home / Self Care | Payer: Medicaid Other | Attending: Emergency Medicine | Admitting: Emergency Medicine

## 2016-08-15 ENCOUNTER — Encounter (HOSPITAL_BASED_OUTPATIENT_CLINIC_OR_DEPARTMENT_OTHER): Payer: Self-pay | Admitting: *Deleted

## 2016-08-15 ENCOUNTER — Emergency Department (HOSPITAL_BASED_OUTPATIENT_CLINIC_OR_DEPARTMENT_OTHER): Payer: Medicaid Other

## 2016-08-15 DIAGNOSIS — F17210 Nicotine dependence, cigarettes, uncomplicated: Secondary | ICD-10-CM | POA: Insufficient documentation

## 2016-08-15 DIAGNOSIS — J189 Pneumonia, unspecified organism: Secondary | ICD-10-CM | POA: Insufficient documentation

## 2016-08-15 LAB — CBC WITH DIFFERENTIAL/PLATELET
Band Neutrophils: 17 %
Basophils Absolute: 0 10*3/uL (ref 0.0–0.1)
Basophils Relative: 0 %
Blasts: 0 %
EOS ABS: 0 10*3/uL (ref 0.0–0.7)
EOS PCT: 0 %
HCT: 39.5 % (ref 36.0–46.0)
HEMOGLOBIN: 13.9 g/dL (ref 12.0–15.0)
Lymphocytes Relative: 12 %
Lymphs Abs: 2.3 10*3/uL (ref 0.7–4.0)
MCH: 31.4 pg (ref 26.0–34.0)
MCHC: 35.2 g/dL (ref 30.0–36.0)
MCV: 89.4 fL (ref 78.0–100.0)
METAMYELOCYTES PCT: 0 %
MYELOCYTES: 0 %
Monocytes Absolute: 1.5 10*3/uL — ABNORMAL HIGH (ref 0.1–1.0)
Monocytes Relative: 8 %
Neutro Abs: 15.5 10*3/uL — ABNORMAL HIGH (ref 1.7–7.7)
Neutrophils Relative %: 63 %
PROMYELOCYTES ABS: 0 %
Platelets: 332 10*3/uL (ref 150–400)
RBC: 4.42 MIL/uL (ref 3.87–5.11)
RDW: 12.9 % (ref 11.5–15.5)
WBC: 19.3 10*3/uL — AB (ref 4.0–10.5)
nRBC: 0 /100 WBC

## 2016-08-15 LAB — COMPREHENSIVE METABOLIC PANEL
ALBUMIN: 3.6 g/dL (ref 3.5–5.0)
ALK PHOS: 165 U/L — AB (ref 38–126)
ALT: 65 U/L — AB (ref 14–54)
AST: 40 U/L (ref 15–41)
Anion gap: 9 (ref 5–15)
BUN: 14 mg/dL (ref 6–20)
CALCIUM: 9.1 mg/dL (ref 8.9–10.3)
CO2: 23 mmol/L (ref 22–32)
CREATININE: 0.83 mg/dL (ref 0.44–1.00)
Chloride: 105 mmol/L (ref 101–111)
GFR calc Af Amer: >60 mL/min (ref >60)
GFR calc non Af Amer: >60 mL/min (ref >60)
Glucose, Bld: 107 mg/dL — ABNORMAL HIGH (ref 65–99)
Potassium: 4.1 mmol/L (ref 3.5–5.1)
SODIUM: 137 mmol/L (ref 135–145)
Total Bilirubin: 0.9 mg/dL (ref 0.3–1.2)
Total Protein: 7.5 g/dL (ref 6.5–8.1)

## 2016-08-15 MED ORDER — HYDROCODONE-ACETAMINOPHEN 5-325 MG PO TABS
1.0000 | ORAL_TABLET | Freq: Once | ORAL | Status: AC
Start: 2016-08-15 — End: 2016-08-15
  Administered 2016-08-15: 1 via ORAL
  Filled 2016-08-15: qty 1

## 2016-08-15 MED ORDER — PROMETHAZINE HCL 25 MG PO TABS
25.0000 mg | ORAL_TABLET | Freq: Four times a day (QID) | ORAL | 1 refills | Status: DC | PRN
Start: 2016-08-15 — End: 2016-08-22

## 2016-08-15 MED ORDER — LEVOFLOXACIN 750 MG PO TABS: 750.0000 mg | ORAL_TABLET | Freq: Every day | ORAL | 0 refills | Status: DC

## 2016-08-15 MED ORDER — DM-GUAIFENESIN ER 30-600 MG PO TB12: 1.0000 | ORAL_TABLET | Freq: Two times a day (BID) | ORAL | 0 refills | Status: DC

## 2016-08-15 MED ORDER — HYDROMORPHONE HCL 1 MG/ML IJ SOLN
1.0000 mg | Freq: Once | INTRAMUSCULAR | Status: AC
  Administered 2016-08-15: 1 mg via INTRAVENOUS
  Filled 2016-08-15: qty 1

## 2016-08-15 MED ORDER — LEVOFLOXACIN IN D5W 750 MG/150ML IV SOLN
750.0000 mg | Freq: Once | INTRAVENOUS | Status: AC
  Administered 2016-08-15: 750 mg via INTRAVENOUS
  Filled 2016-08-15: qty 150

## 2016-08-15 MED ORDER — ONDANSETRON HCL 4 MG/2ML IJ SOLN
4.0000 mg | Freq: Once | INTRAMUSCULAR | Status: AC
  Administered 2016-08-15: 4 mg via INTRAVENOUS
  Filled 2016-08-15: qty 2

## 2016-08-15 MED ORDER — ALBUTEROL SULFATE HFA 108 (90 BASE) MCG/ACT IN AERS
2.0000 | INHALATION_SPRAY | Freq: Four times a day (QID) | RESPIRATORY_TRACT | Status: DC
  Administered 2016-08-15: 2 via RESPIRATORY_TRACT
  Filled 2016-08-15 (×2): qty 6.7

## 2016-08-15 MED ORDER — HYDROCODONE-ACETAMINOPHEN 5-325 MG PO TABS: 1.0000 | ORAL_TABLET | Freq: Four times a day (QID) | ORAL | 0 refills | Status: DC | PRN

## 2016-08-15 MED ORDER — SODIUM CHLORIDE 0.9 % IV BOLUS (SEPSIS)
1000.0000 mL | Freq: Once | INTRAVENOUS | Status: AC
  Administered 2016-08-15: 1000 mL via INTRAVENOUS

## 2016-08-15 MED ORDER — SODIUM CHLORIDE 0.9 % IV SOLN
INTRAVENOUS | Status: DC
  Administered 2016-08-15: 11:00:00 via INTRAVENOUS

## 2016-08-15 NOTE — Discharge Instructions (Signed)
Take antibiotic as directed. Would expect improvement in the next 2 days. Return for any new or worse symptoms. Use albuterol inhaler 2 puffs every 6 hours for the next 7 days. Take the Phenergan as needed for nausea and vomiting. Take the hydrocodone as needed for the chest pain. Take Mucinex DM for the phlegm and cough.

## 2016-08-15 NOTE — ED Notes (Signed)
Patient is resting comfortably. 

## 2016-08-15 NOTE — ED Notes (Signed)
Please note that the vicodin po was given by this RN.

## 2016-08-15 NOTE — ED Provider Notes (Signed)
[facility] Provider Note   CSN: [id number] Arrival date & time: 08/15/16  0857     History   Chief Complaint Chief Complaint  Patient presents with  . Chest Pain  . Cough    HPI Cassidy Miles is a 50 y.o. female.  Patient with 2 week history of upper respiratory infection with productive cough. But symptoms got worse in the past few days. Associated with fever occasional vomiting and diarrhea. Patient is coughing up yellow phlegm. Also associated with chest wall pain which is worse with coughing predominantly in the back part of the chest.      Past Medical History:  Diagnosis Date  . Anxiety and depression   . GERD (gastroesophageal reflux disease)   . Hypercholesteremia   . IBS (irritable bowel syndrome)   . Migraines   . Panic attack     Patient Active Problem List   Diagnosis Date Noted  . Personality disorder 03/12/2013  . Agoraphobia with panic disorder 03/12/2013  . GERD (gastroesophageal reflux disease)   . IBS (irritable bowel syndrome)   . Migraines   . Hypercholesteremia   . Anxiety and depression   . Panic attack   . GERD 02/20/2009  . IRRITABLE BOWEL SYNDROME 02/20/2009    Past Surgical History:  Procedure Laterality Date  . TONSILLECTOMY      OB History    No data available       Home Medications    Prior to Admission medications   Medication Sig Start Date End Date Taking? Authorizing Provider  dextromethorphan-guaiFENesin (MUCINEX DM) 30-600 MG 12hr tablet Take 1 tablet by mouth 2 (two) times daily. 08/15/16   Vanetta Mulders, MD  HYDROcodone-acetaminophen (NORCO/VICODIN) 5-325 MG tablet Take 1-2 tablets by mouth every 6 (six) hours as needed for moderate pain. 08/15/16   Vanetta Mulders, MD  levofloxacin (LEVAQUIN) 750 MG tablet Take 1 tablet (750 mg total) by mouth daily. 08/15/16   Vanetta Mulders, MD  promethazine (PHENERGAN) 25 MG tablet Take 1 tablet (25 mg total) by mouth every 6 (six) hours as needed for nausea or  vomiting. 08/15/16   Vanetta Mulders, MD    Family History Family History  Problem Relation Age of Onset  . Alzheimer's disease Mother     Social History Social History  Substance Use Topics  . Smoking status: Current Every Day Smoker    Packs/day: 0.50    Types: Cigarettes  . Smokeless tobacco: Never Used  . Alcohol use 0.0 oz/week     Comment: occ     Allergies   Cephalexin; Clarithromycin; Fioricet [butalbital-apap-caffeine]; Penicillins; and Tramadol   Review of Systems Review of Systems  Constitutional: Positive for fatigue and fever.  HENT: Positive for congestion.   Eyes: Negative for visual disturbance.  Respiratory: Positive for cough and shortness of breath.   Cardiovascular: Positive for chest pain.  Gastrointestinal: Positive for diarrhea, nausea and vomiting. Negative for abdominal pain.  Genitourinary: Negative for dysuria.  Musculoskeletal: Positive for back pain.  Skin: Negative for rash.  Neurological: Negative for headaches.  Hematological: Does not bruise/bleed easily.  Psychiatric/Behavioral: Negative for confusion.     Physical Exam Updated Vital Signs BP 96/62 (BP Location: Right Arm)   Pulse 90   Temp 99.9 F (37.7 C) (Oral)   Resp 25   Ht 5\' 5"  (1.651 m)   Wt 52.2 kg   LMP 10/14/2014 Comment: irregular  SpO2 91%   BMI 19.14 kg/m   Physical Exam  Constitutional: She is  oriented to person, place, and time. She appears well-developed and well-nourished.  HENT:  Head: Normocephalic.  Mouth/Throat: Oropharynx is clear and moist. No oropharyngeal exudate.  Eyes: Conjunctivae and EOM are normal. Pupils are equal, round, and reactive to light.  Neck: Normal range of motion. Neck supple.  Cardiovascular: Regular rhythm and normal heart sounds.   Slightly tachycardic  Pulmonary/Chest: Effort normal and breath sounds normal. She has no wheezes. She has no rales.  Abdominal: Soft. Bowel sounds are normal. There is no tenderness.    Musculoskeletal: Normal range of motion. She exhibits no edema.  Neurological: She is alert and oriented to person, place, and time. No cranial nerve deficit. She exhibits normal muscle tone. Coordination normal.  Skin: Skin is warm. Capillary refill takes less than 2 seconds.  Nursing note and vitals reviewed.    ED Treatments / Results  Labs (all labs ordered are listed, but only abnormal results are displayed) Labs Reviewed  CBC WITH DIFFERENTIAL/PLATELET - Abnormal; Notable for the following:       Result Value   WBC 19.3 (*)    Neutro Abs 15.5 (*)    Monocytes Absolute 1.5 (*)    All other components within normal limits  COMPREHENSIVE METABOLIC PANEL - Abnormal; Notable for the following:    Glucose, Bld 107 (*)    ALT 65 (*)    Alkaline Phosphatase 165 (*)    All other components within normal limits    EKG  EKG Interpretation  Date/Time:  Thursday August 15 2016 09:06:27 EDT Ventricular Rate:  98 PR Interval:    QRS Duration: 90 QT Interval:  339 QTC Calculation: 433 R Axis:   81 Text Interpretation:  Sinus rhythm Short PR interval Consider left ventricular hypertrophy Baseline wander in lead(s) V5 V6 No significant change since last tracing Confirmed by Rabon Scholle  MD, Jaimee Corum [phone]) on 08/15/2016 9:19:23 AM       Radiology Dg Chest 2 View  Result Date: 08/15/2016 CLINICAL DATA:  Cough and fever with congestion for 2 weeks EXAM: CHEST  2 VIEW COMPARISON:  May 29, 2014 FINDINGS: There is airspace consolidation in the posterior left base. The lungs elsewhere are clear. Heart size and pulmonary vascularity are normal. No adenopathy. No bone lesions. IMPRESSION: Airspace consolidation consistent with pneumonia posterior left base. Lungs elsewhere clear. Cardiac silhouette within normal limits. Followup PA and lateral chest radiographs recommended in 3-4 weeks following trial of antibiotic therapy to ensure resolution and exclude underlying malignancy. Electronically  Signed   By: Bretta Bang III M.D.   On: 08/15/2016 09:41    Procedures Procedures (including critical care time)  Medications Ordered in ED Medications  0.9 %  sodium chloride infusion ( Intravenous New Bag/Given 08/15/16 1047)  albuterol (PROVENTIL HFA;VENTOLIN HFA) 108 (90 Base) MCG/ACT inhaler 2 puff (2 puffs Inhalation Given 08/15/16 1342)  HYDROcodone-acetaminophen (NORCO/VICODIN) 5-325 MG per tablet 1 tablet (not administered)  sodium chloride 0.9 % bolus 1,000 mL (0 mLs Intravenous Stopped 08/15/16 1046)  ondansetron (ZOFRAN) injection 4 mg (4 mg Intravenous Given 08/15/16 1008)  HYDROmorphone (DILAUDID) injection 1 mg (1 mg Intravenous Given 08/15/16 1008)  levofloxacin (LEVAQUIN) IVPB 750 mg (0 mg Intravenous Stopped 08/15/16 1229)     Initial Impression / Assessment and Plan / ED Course  I have reviewed the triage vital signs and the nursing notes.  Pertinent labs & imaging results that were available during my care of the patient were reviewed by me and considered in my medical decision making (see  chart for details).  Clinical Course    The patient with upper respiratory infection cough productive for about 2 weeks but symptoms got worse in the past few days. Associated with fever occasional vomiting and some diarrhea. Room air sats upon arrival were 97%. Patient had 3 episodes of vomiting yesterday and loose bowel movement 6 times yesterday. Workup here chest x-ray consistent with pneumonia. This would be community acquired pneumonia. Patient has not been admitted recently. Patient has allergies to penicillin and cephalosporin so treated here with Levaquin IV. Patient's oxygen sats remained stable. Heart rate improved from in the low 100s down to the 90s. Patient still with cough overall stable. Patient can be discharged home with oral antibiotics for the next 7 days Phenergan for the nausea hydrocodone for the chest wall pain albuterol inhaler and Mucinex DM.  Final Clinical  Impressions(s) / ED Diagnoses   Final diagnoses:  CAP (community acquired pneumonia)    New Prescriptions New Prescriptions   DEXTROMETHORPHAN-GUAIFENESIN (MUCINEX DM) 30-600 MG 12HR TABLET    Take 1 tablet by mouth 2 (two) times daily.   HYDROCODONE-ACETAMINOPHEN (NORCO/VICODIN) 5-325 MG TABLET    Take 1-2 tablets by mouth every 6 (six) hours as needed for moderate pain.   LEVOFLOXACIN (LEVAQUIN) 750 MG TABLET    Take 1 tablet (750 mg total) by mouth daily.   PROMETHAZINE (PHENERGAN) 25 MG TABLET    Take 1 tablet (25 mg total) by mouth every 6 (six) hours as needed for nausea or vomiting.     Vanetta MuldersScott Diondra Pines, MD 08/15/16 1346

## 2016-08-15 NOTE — ED Triage Notes (Signed)
Pt reports 2 weeks of cough, with chest soreness and pain x 4 days, worse today "it hurts so bad to take a deep breath!" ekg performed while pt being triaged. Pt reports decreased po intake, with diarrhea and vomiting.

## 2016-08-18 ENCOUNTER — Emergency Department (HOSPITAL_BASED_OUTPATIENT_CLINIC_OR_DEPARTMENT_OTHER): Payer: 59

## 2016-08-18 ENCOUNTER — Encounter (HOSPITAL_BASED_OUTPATIENT_CLINIC_OR_DEPARTMENT_OTHER): Payer: Self-pay | Admitting: Emergency Medicine

## 2016-08-18 ENCOUNTER — Inpatient Hospital Stay (HOSPITAL_BASED_OUTPATIENT_CLINIC_OR_DEPARTMENT_OTHER)
Admission: EM | Admit: 2016-08-18 | Discharge: 2016-08-22 | DRG: 195 | Disposition: A | Discharge status: Home / Self Care | Payer: 59 | Attending: Internal Medicine | Admitting: Internal Medicine

## 2016-08-18 DIAGNOSIS — Z880 Allergy status to penicillin: Secondary | ICD-10-CM

## 2016-08-18 DIAGNOSIS — Y95 Nosocomial condition: Secondary | ICD-10-CM | POA: Diagnosis present

## 2016-08-18 DIAGNOSIS — Z820 Family history of epilepsy and other diseases of the nervous system: Secondary | ICD-10-CM | POA: Diagnosis not present

## 2016-08-18 DIAGNOSIS — F1410 Cocaine abuse, uncomplicated: Secondary | ICD-10-CM | POA: Diagnosis present

## 2016-08-18 DIAGNOSIS — E7800 Pure hypercholesterolemia, unspecified: Secondary | ICD-10-CM | POA: Diagnosis present

## 2016-08-18 DIAGNOSIS — F418 Other specified anxiety disorders: Secondary | ICD-10-CM | POA: Diagnosis not present

## 2016-08-18 DIAGNOSIS — J189 Pneumonia, unspecified organism: Secondary | ICD-10-CM | POA: Diagnosis present

## 2016-08-18 DIAGNOSIS — Z883 Allergy status to other anti-infective agents status: Secondary | ICD-10-CM

## 2016-08-18 DIAGNOSIS — F17210 Nicotine dependence, cigarettes, uncomplicated: Secondary | ICD-10-CM | POA: Diagnosis present

## 2016-08-18 DIAGNOSIS — Z23 Encounter for immunization: Secondary | ICD-10-CM | POA: Diagnosis not present

## 2016-08-18 DIAGNOSIS — K219 Gastro-esophageal reflux disease without esophagitis: Secondary | ICD-10-CM | POA: Diagnosis present

## 2016-08-18 DIAGNOSIS — D72828 Other elevated white blood cell count: Secondary | ICD-10-CM

## 2016-08-18 DIAGNOSIS — F1110 Opioid abuse, uncomplicated: Secondary | ICD-10-CM | POA: Diagnosis present

## 2016-08-18 DIAGNOSIS — F329 Major depressive disorder, single episode, unspecified: Secondary | ICD-10-CM | POA: Diagnosis present

## 2016-08-18 DIAGNOSIS — Z720 Tobacco use: Secondary | ICD-10-CM | POA: Diagnosis not present

## 2016-08-18 DIAGNOSIS — F410 Panic disorder [episodic paroxysmal anxiety] without agoraphobia: Secondary | ICD-10-CM | POA: Diagnosis present

## 2016-08-18 LAB — CBC WITH DIFFERENTIAL/PLATELET
BASOS PCT: 0 %
Basophils Absolute: 0 10*3/uL (ref 0.0–0.1)
EOS ABS: 0 10*3/uL (ref 0.0–0.7)
Eosinophils Relative: 0 %
HCT: 38.5 % (ref 36.0–46.0)
Hemoglobin: 13.2 g/dL (ref 12.0–15.0)
Lymphocytes Relative: 15 %
Lymphs Abs: 1.9 10*3/uL (ref 0.7–4.0)
MCH: 31.2 pg (ref 26.0–34.0)
MCHC: 34.3 g/dL (ref 30.0–36.0)
MCV: 91 fL (ref 78.0–100.0)
MONO ABS: 0.8 10*3/uL (ref 0.1–1.0)
MONOS PCT: 6 %
NEUTROS PCT: 79 %
Neutro Abs: 10 10*3/uL — ABNORMAL HIGH (ref 1.7–7.7)
PLATELETS: 437 10*3/uL — AB (ref 150–400)
RBC: 4.23 MIL/uL (ref 3.87–5.11)
RDW: 12.7 % (ref 11.5–15.5)
WBC: 12.7 10*3/uL — ABNORMAL HIGH (ref 4.0–10.5)

## 2016-08-18 LAB — URINALYSIS, ROUTINE W REFLEX MICROSCOPIC
BILIRUBIN URINE: NEGATIVE
Glucose, UA: NEGATIVE mg/dL
HGB URINE DIPSTICK: NEGATIVE
Ketones, ur: 15 mg/dL — AB
Leukocytes, UA: NEGATIVE
Nitrite: NEGATIVE
PROTEIN: NEGATIVE mg/dL
Specific Gravity, Urine: 1.03 (ref 1.005–1.030)
pH: 6.5 (ref 5.0–8.0)

## 2016-08-18 LAB — I-STAT CG4 LACTIC ACID, ED
LACTIC ACID, VENOUS: 1.21 mmol/L (ref 0.5–1.9)
Lactic Acid, Venous: 2.58 mmol/L (ref 0.5–1.9)

## 2016-08-18 LAB — COMPREHENSIVE METABOLIC PANEL
ALK PHOS: 127 U/L — AB (ref 38–126)
ALT: 35 U/L (ref 14–54)
AST: 33 U/L (ref 15–41)
Albumin: 3.1 g/dL — ABNORMAL LOW (ref 3.5–5.0)
Anion gap: 11 (ref 5–15)
BILIRUBIN TOTAL: 0.7 mg/dL (ref 0.3–1.2)
BUN: 14 mg/dL (ref 6–20)
CALCIUM: 8.8 mg/dL — AB (ref 8.9–10.3)
CO2: 23 mmol/L (ref 22–32)
CREATININE: 0.87 mg/dL (ref 0.44–1.00)
Chloride: 102 mmol/L (ref 101–111)
GFR calc non Af Amer: >60 mL/min (ref >60)
GLUCOSE: 170 mg/dL — AB (ref 65–99)
Potassium: 3.7 mmol/L (ref 3.5–5.1)
SODIUM: 136 mmol/L (ref 135–145)
Total Protein: 7.2 g/dL (ref 6.5–8.1)

## 2016-08-18 LAB — CBC
HCT: 33.8 % — ABNORMAL LOW (ref 36.0–46.0)
Hemoglobin: 11.3 g/dL — ABNORMAL LOW (ref 12.0–15.0)
MCH: 30.8 pg (ref 26.0–34.0)
MCHC: 33.4 g/dL (ref 30.0–36.0)
MCV: 92.1 fL (ref 78.0–100.0)
PLATELETS: 364 10*3/uL (ref 150–400)
RBC: 3.67 MIL/uL — ABNORMAL LOW (ref 3.87–5.11)
RDW: 12.8 % (ref 11.5–15.5)
WBC: 11.8 10*3/uL — ABNORMAL HIGH (ref 4.0–10.5)

## 2016-08-18 LAB — CREATININE, SERUM
Creatinine, Ser: 0.73 mg/dL (ref 0.44–1.00)
GFR calc non Af Amer: >60 mL/min (ref >60)

## 2016-08-18 LAB — LACTIC ACID, PLASMA
LACTIC ACID, VENOUS: 1.6 mmol/L (ref 0.5–1.9)
LACTIC ACID, VENOUS: 1.3 mmol/L (ref 0.5–1.9)

## 2016-08-18 MED ORDER — INFLUENZA VAC SPLIT QUAD 0.5 ML IM SUSY
0.5000 mL | PREFILLED_SYRINGE | INTRAMUSCULAR | Status: DC
  Filled 2016-08-18: qty 0.5

## 2016-08-18 MED ORDER — LEVOFLOXACIN IN D5W 750 MG/150ML IV SOLN
750.0000 mg | Freq: Once | INTRAVENOUS | Status: AC
  Administered 2016-08-18: 750 mg via INTRAVENOUS
  Filled 2016-08-18: qty 150

## 2016-08-18 MED ORDER — SODIUM CHLORIDE 0.9 % IV BOLUS (SEPSIS)
1000.0000 mL | Freq: Once | INTRAVENOUS | Status: AC
Start: 2016-08-18 — End: 2016-08-18
  Administered 2016-08-18: 1000 mL via INTRAVENOUS

## 2016-08-18 MED ORDER — ONDANSETRON 4 MG PO TBDP
4.0000 mg | ORAL_TABLET | Freq: Once | ORAL | Status: AC
  Administered 2016-08-18: 4 mg via ORAL
  Filled 2016-08-18: qty 1

## 2016-08-18 MED ORDER — PROMETHAZINE HCL 25 MG PO TABS: 25.0000 mg | ORAL_TABLET | Freq: Four times a day (QID) | ORAL | Status: DC | PRN

## 2016-08-18 MED ORDER — SODIUM CHLORIDE 0.9 % IV BOLUS (SEPSIS)
1000.0000 mL | Freq: Once | INTRAVENOUS | Status: AC
  Administered 2016-08-18: 1000 mL via INTRAVENOUS

## 2016-08-18 MED ORDER — FENTANYL CITRATE (PF) 100 MCG/2ML IJ SOLN
50.0000 ug | Freq: Three times a day (TID) | INTRAMUSCULAR | Status: DC | PRN
  Administered 2016-08-18 – 2016-08-22 (×3): 50 ug via INTRAVENOUS
  Filled 2016-08-18 (×3): qty 2

## 2016-08-18 MED ORDER — IBUPROFEN 600 MG PO TABS: 600.0000 mg | ORAL_TABLET | Freq: Four times a day (QID) | ORAL | Status: DC | PRN

## 2016-08-18 MED ORDER — VANCOMYCIN HCL IN DEXTROSE 1-5 GM/200ML-% IV SOLN
1000.0000 mg | Freq: Once | INTRAVENOUS | Status: AC
  Administered 2016-08-18: 1000 mg via INTRAVENOUS
  Filled 2016-08-18: qty 200

## 2016-08-18 MED ORDER — IPRATROPIUM-ALBUTEROL 0.5-2.5 (3) MG/3ML IN SOLN
3.0000 mL | Freq: Three times a day (TID) | RESPIRATORY_TRACT | Status: DC
  Administered 2016-08-19 – 2016-08-21 (×7): 3 mL via RESPIRATORY_TRACT
  Filled 2016-08-18 (×7): qty 3

## 2016-08-18 MED ORDER — KETOROLAC TROMETHAMINE 30 MG/ML IJ SOLN
30.0000 mg | Freq: Three times a day (TID) | INTRAMUSCULAR | Status: DC | PRN
Start: 2016-08-18 — End: 2016-08-19
  Administered 2016-08-18 – 2016-08-19 (×2): 30 mg via INTRAVENOUS
  Filled 2016-08-18 (×2): qty 1

## 2016-08-18 MED ORDER — SODIUM CHLORIDE 0.9 % IV SOLN
1000.0000 mL | INTRAVENOUS | Status: DC
  Administered 2016-08-18: 1000 mL via INTRAVENOUS

## 2016-08-18 MED ORDER — VANCOMYCIN HCL IN DEXTROSE 750-5 MG/150ML-% IV SOLN
750.0000 mg | Freq: Two times a day (BID) | INTRAVENOUS | Status: DC
  Administered 2016-08-18 – 2016-08-21 (×6): 750 mg via INTRAVENOUS
  Filled 2016-08-18 (×8): qty 150

## 2016-08-18 MED ORDER — BENZONATATE 100 MG PO CAPS
100.0000 mg | ORAL_CAPSULE | Freq: Three times a day (TID) | ORAL | Status: DC | PRN
Start: 2016-08-18 — End: 2016-08-19
  Administered 2016-08-18: 100 mg via ORAL
  Filled 2016-08-18 (×2): qty 1

## 2016-08-18 MED ORDER — LEVOFLOXACIN IN D5W 750 MG/150ML IV SOLN
750.0000 mg | INTRAVENOUS | Status: DC
  Filled 2016-08-18: qty 150

## 2016-08-18 MED ORDER — IOPAMIDOL (ISOVUE-370) INJECTION 76%
100.0000 mL | Freq: Once | INTRAVENOUS | Status: AC | PRN
  Administered 2016-08-18: 100 mL via INTRAVENOUS

## 2016-08-18 MED ORDER — SODIUM CHLORIDE 0.9 % IV SOLN
1000.0000 mL | INTRAVENOUS | Status: AC
  Administered 2016-08-18 (×2): 1000 mL via INTRAVENOUS

## 2016-08-18 MED ORDER — MORPHINE SULFATE (PF) 4 MG/ML IV SOLN
4.0000 mg | Freq: Once | INTRAVENOUS | Status: AC
  Administered 2016-08-18: 4 mg via INTRAVENOUS
  Filled 2016-08-18: qty 1

## 2016-08-18 MED ORDER — ALBUTEROL SULFATE (2.5 MG/3ML) 0.083% IN NEBU
5.0000 mg | INHALATION_SOLUTION | Freq: Once | RESPIRATORY_TRACT | Status: AC
  Administered 2016-08-18: 5 mg via RESPIRATORY_TRACT
  Filled 2016-08-18: qty 6

## 2016-08-18 MED ORDER — ONDANSETRON HCL 4 MG/2ML IJ SOLN
4.0000 mg | Freq: Three times a day (TID) | INTRAMUSCULAR | Status: DC | PRN
  Administered 2016-08-20 – 2016-08-22 (×4): 4 mg via INTRAVENOUS
  Filled 2016-08-18 (×4): qty 2

## 2016-08-18 MED ORDER — OXYCODONE-ACETAMINOPHEN 5-325 MG PO TABS
1.0000 | ORAL_TABLET | Freq: Once | ORAL | Status: AC
  Administered 2016-08-18: 1 via ORAL
  Filled 2016-08-18: qty 1

## 2016-08-18 MED ORDER — IPRATROPIUM-ALBUTEROL 0.5-2.5 (3) MG/3ML IN SOLN
3.0000 mL | Freq: Four times a day (QID) | RESPIRATORY_TRACT | Status: DC
  Administered 2016-08-18: 3 mL via RESPIRATORY_TRACT
  Filled 2016-08-18: qty 3

## 2016-08-18 MED ORDER — ALBUTEROL SULFATE (2.5 MG/3ML) 0.083% IN NEBU: 2.5000 mg | INHALATION_SOLUTION | RESPIRATORY_TRACT | Status: DC | PRN

## 2016-08-18 MED ORDER — ENOXAPARIN SODIUM 40 MG/0.4ML ~~LOC~~ SOLN
40.0000 mg | SUBCUTANEOUS | Status: DC
  Administered 2016-08-18 – 2016-08-21 (×4): 40 mg via SUBCUTANEOUS
  Filled 2016-08-18 (×4): qty 0.4

## 2016-08-18 NOTE — ED Notes (Signed)
Pt requesting pain medication, MD notified, no orders given 

## 2016-08-18 NOTE — ED Notes (Signed)
Critical lactic acid values 2.58 told to [place], [place]

## 2016-08-18 NOTE — ED Triage Notes (Signed)
Pt dx with pneumonia Thursday. Pt states she is still SOB and coughing.

## 2016-08-18 NOTE — ED Notes (Signed)
MD at bedside. 

## 2016-08-18 NOTE — ED Provider Notes (Signed)
[facility]   CSN: [id number] Arrival date & time: 08/18/16  6803     History   Chief Complaint Chief Complaint  Patient presents with  . Shortness of Breath    HPI Cassidy Miles is a 50 y.o. female.  HPI  Pt presenting with c/o ongoing cough and shortness of breath.  She was diagnosed with pneumonia 3 days ago in the ED and started on levaquin. She states she has been taking abx as prescribed but continues to have pain in right sided of chest and right upper back as well as worsening cough and difficutly breahign.  She has not been able to drink well due to nausea.  Has not had any solid food intake.  She has frequent harsh cough as well.   There are no other associated systemic symptoms, there are no other alleviating or modifying factors.   Past Medical History:  Diagnosis Date  . Anxiety and depression   . GERD (gastroesophageal reflux disease)   . Hypercholesteremia   . IBS (irritable bowel syndrome)   . Migraines   . Panic attack     Patient Active Problem List   Diagnosis Date Noted  . Community acquired pneumonia 08/18/2016  . Personality disorder 03/12/2013  . Agoraphobia with panic disorder 03/12/2013  . GERD (gastroesophageal reflux disease)   . IBS (irritable bowel syndrome)   . Migraines   . Hypercholesteremia   . Anxiety and depression   . Panic attack   . GERD 02/20/2009  . IRRITABLE BOWEL SYNDROME 02/20/2009    Past Surgical History:  Procedure Laterality Date  . TONSILLECTOMY      OB History    No data available       Home Medications    Prior to Admission medications   Medication Sig Start Date End Date Taking? Authorizing Provider  dextromethorphan-guaiFENesin (MUCINEX DM) 30-600 MG 12hr tablet Take 1 tablet by mouth 2 (two) times daily. 08/15/16   Vanetta Mulders, MD  HYDROcodone-acetaminophen (NORCO/VICODIN) 5-325 MG tablet Take 1-2 tablets by mouth every 6 (six) hours as needed for moderate pain. 08/15/16    Vanetta Mulders, MD  levofloxacin (LEVAQUIN) 750 MG tablet Take 1 tablet (750 mg total) by mouth daily. 08/15/16   Vanetta Mulders, MD  promethazine (PHENERGAN) 25 MG tablet Take 1 tablet (25 mg total) by mouth every 6 (six) hours as needed for nausea or vomiting. 08/15/16   Vanetta Mulders, MD    Family History Family History  Problem Relation Age of Onset  . Alzheimer's disease Mother     Social History Social History  Substance Use Topics  . Smoking status: Current Every Day Smoker    Packs/day: 0.50    Types: Cigarettes  . Smokeless tobacco: Never Used  . Alcohol use 0.0 oz/week     Comment: occ     Allergies   Cephalexin; Clarithromycin; Fioricet [butalbital-apap-caffeine]; Penicillins; and Tramadol   Review of Systems Review of Systems  ROS reviewed and all otherwise negative except for mentioned in HPI   Physical Exam Updated Vital Signs BP (!) 86/53 (BP Location: Left Arm)   Pulse 81   Temp 98.6 F (37 C) (Oral)   Resp 17   LMP 10/14/2014 Comment: irregular  SpO2 96%  Vitals reviewed Physical Exam Physical Examination: General appearance - alert, ill appearing, and in no distress Mental status - alert, oriented to person, place, and time Eyes - no conjunctival injection no scleral icterus Mouth - MM dry, OP without  lesions Chest - decreased breath sounds with rhonchi over right lungfield, tachpneic, some increased respiratory effort, mild expiratory wheezing Heart - normal rate, regular rhythm, normal S1, S2, no murmurs, rubs, clicks or gallops Abdomen - soft, nontender, nondistended, no masses or organomegaly Neurological - alert, oriented, normal speech Extremities - peripheral pulses normal, no pedal edema, no clubbing or cyanosis Skin - normal coloration and turgor, no rashes Psych- anxious but cooperative  ED Treatments / Results  Labs (all labs ordered are listed, but only abnormal results are displayed) Labs Reviewed  COMPREHENSIVE METABOLIC  PANEL - Abnormal; Notable for the following:       Result Value   Glucose, Bld 170 (*)    Calcium 8.8 (*)    Albumin 3.1 (*)    Alkaline Phosphatase 127 (*)    All other components within normal limits  CBC WITH DIFFERENTIAL/PLATELET - Abnormal; Notable for the following:    WBC 12.7 (*)    Platelets 437 (*)    Neutro Abs 10.0 (*)    All other components within normal limits  URINALYSIS, ROUTINE W REFLEX MICROSCOPIC (NOT AT [facility]) - Abnormal; Notable for the following:    Ketones, ur 15 (*)    All other components within normal limits  I-STAT CG4 LACTIC ACID, ED - Abnormal; Notable for the following:    Lactic Acid, Venous 2.58 (*)    All other components within normal limits  CULTURE, BLOOD (ROUTINE X 2)  CULTURE, BLOOD (ROUTINE X 2)  URINE CULTURE  GRAM STAIN  HIV ANTIBODY (ROUTINE TESTING)  STREP PNEUMONIAE URINARY ANTIGEN  CBC  CREATININE, SERUM  I-STAT CG4 LACTIC ACID, ED  I-STAT CG4 LACTIC ACID, ED    EKG  EKG Interpretation  Date/Time:  Sunday August 18 2016 10:04:58 EDT Ventricular Rate:  98 PR Interval:    QRS Duration: 87 QT Interval:  333 QTC Calculation: 426 R Axis:   67 Text Interpretation:  Sinus rhythm Short PR interval No significant change since last tracing Confirmed by Karma GanjaLINKER  MD, Donivin Wirt [phone]) on 08/18/2016 10:16:38 AM Also confirmed by Karma GanjaLINKER  MD, Jemel Ono [phone]), editor [place] CT, Jola BabinskiMarilyn [phone]([id number])  on 08/18/2016 10:58:49 AM       Radiology Dg Chest 2 View  Result Date: 08/18/2016 CLINICAL DATA:  Two week history of upper respiratory infection and productive cough. Fever. EXAM: CHEST  2 VIEW COMPARISON:  08/15/2016; 05/29/2014 FINDINGS: Grossly unchanged cardiac silhouette and mediastinal contours with atherosclerotic plaque within thoracic aorta. Worsening bilateral lower lung ill-defined heterogeneous airspace opacities. Mild diffuse slightly nodular thickening of the pulmonary interstitium. No definite pleural effusion or pneumothorax. No evidence  of edema. No acute osseus abnormalities. 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 IMPRESSION: Findings worrisome for progression of bilateral multi focal lower lung pneumonia. A follow-up chest radiograph in 3 to 4 weeks after treatment is recommended to ensure resolution. Electronically Signed   By: Simonne ComeJohn  Watts M.D.   On: 08/18/2016 11:55   Ct Angio Chest Pe W Or Wo Contrast  Result Date: 08/18/2016 CLINICAL DATA:  Pt with SOB, chest wall pain, pneumonia ( diagnosed on Thursday), low grade fever at timesCough, non-productive EXAM: CT ANGIOGRAPHY CHEST WITH CONTRAST TECHNIQUE: Multidetector CT imaging of the chest was performed using the standard protocol during bolus administration of intravenous contrast. Multiplanar CT image reconstructions and MIPs were obtained to evaluate the vascular anatomy. CONTRAST:  100 mL of Isovue 370 intravenous contrast COMPARISON:  Current chest radiograph FINDINGS:  Cardiovascular: There is no evidence of a pulmonary embolism. The heart is normal in size. There are no significant coronary artery calcifications. The great vessels are normal in caliber. No significant aortic or branch vessel plaque. Mediastinum/Nodes: Small low-density thyroid lesions consistent with sub cm nodules. No mediastinal or hilar masses. Prominent hilar lymph nodes, largest on the left measuring 9 mm in short axis. Lungs/Pleura: There is multi focal lung consolidation. There is consolidation in both lower lobes, in the left upper lobe lingula and in the anterior inferior right upper lobe and adjacent right middle lobe. There is no pulmonary edema. No lung mass or suspicious nodule. No pleural effusion. Upper Abdomen: No acute abnormality. Musculoskeletal: No chest wall abnormality. No acute or significant osseous findings. Review of the MIP images confirms the above findings. IMPRESSION: 1. No evidence of a pulmonary embolism. 2.  Bilateral areas of lung consolidation consistent with multifocal pneumonia. No pulmonary edema. Electronically Signed   By: Amie Portland M.D.   On: 08/18/2016 12:59    Procedures Procedures (including critical care time)  Medications Ordered in ED Medications  vancomycin (VANCOCIN) IVPB 750 mg/150 ml premix (not administered)  levofloxacin (LEVAQUIN) IVPB 750 mg (not administered)  0.9 %  sodium chloride infusion (1,000 mLs Intravenous New Bag/Given 08/18/16 1608)  promethazine (PHENERGAN) tablet 25 mg (not administered)  enoxaparin (LOVENOX) injection 40 mg (not administered)  ondansetron (ZOFRAN) injection 4 mg (not administered)  benzonatate (TESSALON) capsule 100 mg (not administered)  ipratropium-albuterol (DUONEB) 0.5-2.5 (3) MG/3ML nebulizer solution 3 mL (3 mLs Nebulization Not Given 08/18/16 1619)  albuterol (PROVENTIL) (2.5 MG/3ML) 0.083% nebulizer solution 2.5 mg (not administered)  ketorolac (TORADOL) 30 MG/ML injection 30 mg (not administered)  fentaNYL (SUBLIMAZE) injection 50 mcg (not administered)  levofloxacin (LEVAQUIN) IVPB 750 mg (0 mg Intravenous Stopped 08/18/16 1322)  vancomycin (VANCOCIN) IVPB 1000 mg/200 mL premix (0 mg Intravenous Stopped 08/18/16 1218)  albuterol (PROVENTIL) (2.5 MG/3ML) 0.083% nebulizer solution 5 mg (5 mg Nebulization Given 08/18/16 1100)  morphine 4 MG/ML injection 4 mg (4 mg Intravenous Given 08/18/16 1107)  ondansetron (ZOFRAN-ODT) disintegrating tablet 4 mg (4 mg Oral Given 08/18/16 1126)  sodium chloride 0.9 % bolus 1,000 mL (0 mLs Intravenous Stopped 08/18/16 1429)  sodium chloride 0.9 % bolus 1,000 mL (0 mLs Intravenous Stopped 08/18/16 1429)  iopamidol (ISOVUE-370) 76 % injection 100 mL (100 mLs Intravenous Contrast Given 08/18/16 1236)  sodium chloride 0.9 % bolus 1,000 mL (1,000 mLs Intravenous Transfusing/Transfer 08/18/16 1510)  oxyCODONE-acetaminophen (PERCOCET/ROXICET) 5-325 MG per tablet 1 tablet (1 tablet Oral Given 08/18/16 1453)      Initial Impression / Assessment and Plan / ED Course  I have reviewed the triage vital signs and the nursing notes.  Pertinent labs & imaging results that were available during my care of the patient were reviewed by me and considered in my medical decision making (see chart for details).  Clinical Course    Pt presenting with worsening clinical picture- was diagnosed with CAP and started on levaquin 3 days ago- she has worsening chest pain, shortness of breath- poor po intake due to nausea. Sepsis order set initiated  Labs reveal somewhat improved leukocytosis, lactate > 2, CXR shows worsening pneumonia.  CT angio obtained of chest due to significant sharp chest pains- no PE found.  Pt started on IV levaquin and vanc.  Admitted to triad at [facility] for further management. Cultures pending.   Final Clinical Impressions(s) / ED Diagnoses   Final diagnoses:  CAP (community acquired pneumonia)  New Prescriptions Current Discharge Medication List       Jerelyn Scott, MD 08/18/16 [phone]

## 2016-08-18 NOTE — Progress Notes (Signed)
Pharmacy Antibiotic Note  Cassidy Miles is a 51 y.o. female admitted on 08/18/2016 with pneumonia - community acquired.  Pharmacy has been consulted for Vancomycin and Levaquin dosing. Penicillin and Cephalosporin allergies noted.   Plan: Vancomycin 1g now as ordered, then 750mg  IV every 12 hours. Continue Levaquin 750mg  IV daily. Monitor renal function, culture results, and clinical status.     Temp (24hrs), Avg:97.9 F (36.6 C), Min:97.9 F (36.6 C), Max:97.9 F (36.6 C)   Recent Labs Lab 08/15/16 0915  WBC 19.3*  CREATININE 0.83    Estimated Creatinine Clearance: 65.3 mL/min (by C-G formula based on SCr of 0.83 mg/dL).    Allergies  Allergen Reactions  . Cephalexin Hives  . Clarithromycin Hives  . Fioricet [Butalbital-Apap-Caffeine] Nausea And Vomiting  . Penicillins Hives  . Tramadol Nausea Only    Antimicrobials this admission: 10/1 Vancomycin >> 10/1 Levaquin >>  Dose adjustments this admission: na  Microbiology results: pending  Thank you for allowing pharmacy to be a part of this patient's care.  Link Snuffer, PharmD, BCPS Clinical Pharmacist [phone] 08/18/2016 10:38 AM

## 2016-08-18 NOTE — ED Notes (Signed)
Attempted to call report to floor x1. 

## 2016-08-18 NOTE — H&P (Signed)
History and Physical    Cassidy Miles EFE:[id number] DOB: [birth year] DOA: 08/18/2016  PCP: No PCP Per Patient  Referring physician:   Chief Complaint: "I was getting short of breath."  HPI: Cassidy Miles is a 50 y.o. female with past medical history significant for anxiety depression and tobacco abuse presented to [facility] with complaint of shortness of breath. Patient stated that 2 weeks ago she began developing upper respiratory tract infection symptoms. The symptoms worsened to the point that she went to the emergency room. There she reported feeling weak,, cough and diarrhea. By x-ray she was diagnosed with pneumonia. She was started on Levaquin and discharged. patient's diarrhea resolved her last bowel movement 2 days ago. She has however had episodes of chills  at night and a constant nonproductive cough accompanied by diffuse chest pain. She has not been smoking. She has been taking her Levaquin.  Emergency room X-ray show multilobular pneumonia. Patient was given vancomycin and Zosyn.she had a soft blood pressure that responded to 2 L of fluid. There was an elevated lactic acid that was not repeated prior to transfer. Patient did make urine emergency room. CTA was done to rule out PE and was negative.   Review of Systems:  As per HPI otherwise 10 point review of systems negative.    Past Medical History:  Diagnosis Date  . Anxiety and depression   . GERD (gastroesophageal reflux disease)   . Hypercholesteremia   . IBS (irritable bowel syndrome)   . Migraines   . Panic attack    Past Surgical History:  Procedure Laterality Date  . TONSILLECTOMY     Social History:  reports that she has been smoking Cigarettes.  She has been smoking about 0.50 packs per day. She has never used smokeless tobacco. She reports that she drinks alcohol. She reports that she does not use drugs.  Allergies  Allergen Reactions  . Cephalexin Hives  . Clarithromycin Hives  . Fioricet  [Butalbital-Apap-Caffeine] Nausea And Vomiting  . Penicillins Hives  . Tramadol Nausea Only    Family History  Problem Relation Age of Onset  . Alzheimer's disease Mother      Prior to Admission medications   Medication Sig Start Date End Date Taking? Authorizing Provider  dextromethorphan-guaiFENesin (MUCINEX DM) 30-600 MG 12hr tablet Take 1 tablet by mouth 2 (two) times daily. 08/15/16   Vanetta Mulders, MD  HYDROcodone-acetaminophen (NORCO/VICODIN) 5-325 MG tablet Take 1-2 tablets by mouth every 6 (six) hours as needed for moderate pain. 08/15/16   Vanetta Mulders, MD  levofloxacin (LEVAQUIN) 750 MG tablet Take 1 tablet (750 mg total) by mouth daily. 08/15/16   Vanetta Mulders, MD  promethazine (PHENERGAN) 25 MG tablet Take 1 tablet (25 mg total) by mouth every 6 (six) hours as needed for nausea or vomiting. 08/15/16   Vanetta Mulders, MD   Physical Exam: Vitals:   08/18/16 1330 08/18/16 1347 08/18/16 1400 08/18/16 1618  BP: 113/67  99/60 (!) 86/53  Pulse: 89  88 81  Resp: (!) 27   17  Temp:  98.6 F (37 C)  98.6 F (37 C)  TempSrc:    Oral  SpO2: 98%  98% 96%    Wt Readings from Last 3 Encounters:  08/15/16 52.2 kg (115 lb)  01/05/15 59 kg (130 lb)  11/10/14 58.1 kg (128 lb)    General:  Appears calm and comfortable Eyes:  PERRL, EOMI, normal lids, iris ENT:  grossly normal hearing, lips & tongue  Neck:  no LAD, masses or thyromegaly Cardiovascular:  RRR, no m/r/g. No LE edema.  Respiratory:  Increased respiratory effort, wheezes and coarse breath sounds throughout Abdomen:  soft, ntnd Skin:  no rash or induration seen on limited exam Musculoskeletal:  grossly normal tone BUE/BLE Psychiatric:  grossly normal mood and affect, speech fluent and appropriate Neurologic:  CN 2-12 grossly intact, moves all extremities in coordinated fashion.          Labs on Admission:  Basic Metabolic Panel:  Recent Labs Lab 08/15/16 0915 08/18/16 1012 08/18/16 1603  NA 137 136  --    K 4.1 3.7  --   CL 105 102  --   CO2 23 23  --   GLUCOSE 107* 170*  --   BUN 14 14  --   CREATININE 0.83 0.87 0.73  CALCIUM 9.1 8.8*  --    Liver Function Tests:  Recent Labs Lab 08/15/16 0915 08/18/16 1012  AST 40 33  ALT 65* 35  ALKPHOS 165* 127*  BILITOT 0.9 0.7  PROT 7.5 7.2  ALBUMIN 3.6 3.1*   No results for input(s): LIPASE, AMYLASE in the last 168 hours. No results for input(s): AMMONIA in the last 168 hours. CBC:  Recent Labs Lab 08/15/16 0915 08/18/16 1012 08/18/16 1603  WBC 19.3* 12.7* 11.8*  NEUTROABS 15.5* 10.0*  --   HGB 13.9 13.2 11.3*  HCT 39.5 38.5 33.8*  MCV 89.4 91.0 92.1  PLT 332 437* 364   Cardiac Enzymes: No results for input(s): CKTOTAL, CKMB, CKMBINDEX, TROPONINI in the last 168 hours.  BNP (last 3 results) No results for input(s): BNP in the last 8760 hours.  ProBNP (last 3 results) No results for input(s): PROBNP in the last 8760 hours.   Serum creatinine: 0.73 mg/dL 40/98/1108/12/04 [id number] Estimated creatinine clearance: 67.8 mL/min  CBG: No results for input(s): GLUCAP in the last 168 hours.  Radiological Exams on Admission: Dg Chest 2 View  Result Date: 08/18/2016 CLINICAL DATA:  Two week history of upper respiratory infection and productive cough. Fever. EXAM: CHEST  2 VIEW COMPARISON:  08/15/2016; 05/29/2014 FINDINGS: Grossly unchanged cardiac silhouette and mediastinal contours with atherosclerotic plaque within thoracic aorta. Worsening bilateral lower lung ill-defined heterogeneous airspace opacities. Mild diffuse slightly nodular thickening of the pulmonary interstitium. No definite pleural effusion or pneumothorax. No evidence of edema. No acute osseus abnormalities. 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 IMPRESSION: Findings worrisome for progression of bilateral multi focal lower lung pneumonia. A follow-up chest radiograph in 3 to 4 weeks after treatment is  recommended to ensure resolution. Electronically Signed   By: Simonne ComeJohn  Watts M.D.   On: 08/18/2016 11:55   Ct Angio Chest Pe W Or Wo Contrast  Result Date: 08/18/2016 CLINICAL DATA:  Pt with SOB, chest wall pain, pneumonia ( diagnosed on Thursday), low grade fever at timesCough, non-productive EXAM: CT ANGIOGRAPHY CHEST WITH CONTRAST TECHNIQUE: Multidetector CT imaging of the chest was performed using the standard protocol during bolus administration of intravenous contrast. Multiplanar CT image reconstructions and MIPs were obtained to evaluate the vascular anatomy. CONTRAST:  100 mL of Isovue 370 intravenous contrast COMPARISON:  Current chest radiograph FINDINGS: Cardiovascular: There is no evidence of a pulmonary embolism. The heart is normal in size. There are no significant coronary artery calcifications. The great vessels are normal in caliber. No significant aortic or branch vessel plaque. Mediastinum/Nodes: Small low-density thyroid lesions consistent with sub cm nodules. No mediastinal  or hilar masses. Prominent hilar lymph nodes, largest on the left measuring 9 mm in short axis. Lungs/Pleura: There is multi focal lung consolidation. There is consolidation in both lower lobes, in the left upper lobe lingula and in the anterior inferior right upper lobe and adjacent right middle lobe. There is no pulmonary edema. No lung mass or suspicious nodule. No pleural effusion. Upper Abdomen: No acute abnormality. Musculoskeletal: No chest wall abnormality. No acute or significant osseous findings. Review of the MIP images confirms the above findings. IMPRESSION: 1. No evidence of a pulmonary embolism. 2. Bilateral areas of lung consolidation consistent with multifocal pneumonia. No pulmonary edema. Electronically Signed   By: Amie Portland M.D.   On: 08/18/2016 12:59    EKG: Independently reviewed.No STEMI Ventricular rate 98, curettes duration 87, QTC 426 PR interval less than 200, sinus  rhythm  Assessment/Plan Active Problems:   Community acquired pneumonia  50 year old female with past medical history significant for smoking[facility] of worsening shortness of breath while on outpatient treatment for many acquired pneumonia.  #1community acquired pneumonia CT shows diffuse multilobar pneumonia Patient has respiratory distress but is not exhibiting hypoxia Continue Levaquin and adding vancomycin Scheduled DuoNeb's every 6 and albuterol nebs every 2 Incentive spirometer and flutter valve Continuous pulse ox Cardiac monitoring Pending labs-HIV antibody, Gram stain, strep pneumonia, urine culture, blood cultures 2 Initial lactic acid 2.58in the emergency room getting repeat Patient got 2 L IV fluid and produce urine at outside ED, continuing IV fluids at a rate of 125 mL an hour Low BP reported after pt transferred, bolus ordered A.m. Labs ordered  #2 tobacco abuse Patient has no desire to smoke and has not smoked for 2 weeks per her report  #3 history of anxiety and depression Patient states that her mood disorders are in remission, denies SI and HI  patient states other conditions in her past medical history are resolved  Code Status: full DVT Prophylaxis: lovenox Family Communication: BF aware per pt Disposition Plan: Pending Improvement    Haydee Salter, MD Family Medicine [facility] [email or web address] Password [email or web address]

## 2016-08-19 DIAGNOSIS — J189 Pneumonia, unspecified organism: Secondary | ICD-10-CM | POA: Diagnosis present

## 2016-08-19 LAB — BASIC METABOLIC PANEL
ANION GAP: 6 (ref 5–15)
BUN: 6 mg/dL (ref 6–20)
CALCIUM: 7.6 mg/dL — AB (ref 8.9–10.3)
CO2: 22 mmol/L (ref 22–32)
Chloride: 111 mmol/L (ref 101–111)
Creatinine, Ser: 0.66 mg/dL (ref 0.44–1.00)
Glucose, Bld: 102 mg/dL — ABNORMAL HIGH (ref 65–99)
POTASSIUM: 3.7 mmol/L (ref 3.5–5.1)
Sodium: 139 mmol/L (ref 135–145)

## 2016-08-19 LAB — CBC
HCT: 31.9 % — ABNORMAL LOW (ref 36.0–46.0)
Hemoglobin: 10.6 g/dL — ABNORMAL LOW (ref 12.0–15.0)
MCH: 30.6 pg (ref 26.0–34.0)
MCHC: 33.2 g/dL (ref 30.0–36.0)
MCV: 92.2 fL (ref 78.0–100.0)
PLATELETS: 340 10*3/uL (ref 150–400)
RBC: 3.46 MIL/uL — AB (ref 3.87–5.11)
RDW: 13.2 % (ref 11.5–15.5)
WBC: 9.9 10*3/uL (ref 4.0–10.5)

## 2016-08-19 LAB — STREP PNEUMONIAE URINARY ANTIGEN: STREP PNEUMO URINARY ANTIGEN: NEGATIVE

## 2016-08-19 LAB — RAPID URINE DRUG SCREEN, HOSP PERFORMED
Amphetamines: NOT DETECTED
Barbiturates: NOT DETECTED
Benzodiazepines: NOT DETECTED
COCAINE: POSITIVE — AB
OPIATES: POSITIVE — AB
Tetrahydrocannabinol: NOT DETECTED

## 2016-08-19 LAB — GLUCOSE, CAPILLARY: GLUCOSE-CAPILLARY: 151 mg/dL — AB (ref 65–99)

## 2016-08-19 LAB — HIV ANTIBODY (ROUTINE TESTING W REFLEX): HIV Screen 4th Generation wRfx: NONREACTIVE

## 2016-08-19 MED ORDER — OXYCODONE-ACETAMINOPHEN 5-325 MG PO TABS
1.0000 | ORAL_TABLET | ORAL | Status: DC | PRN
  Administered 2016-08-19 – 2016-08-21 (×11): 2 via ORAL
  Filled 2016-08-19 (×7): qty 2
  Filled 2016-08-19: qty 1
  Filled 2016-08-19 (×4): qty 2

## 2016-08-19 MED ORDER — KETOROLAC TROMETHAMINE 30 MG/ML IJ SOLN: 30.0000 mg | Freq: Four times a day (QID) | INTRAMUSCULAR | Status: DC | PRN

## 2016-08-19 MED ORDER — HYDROCOD POLST-CPM POLST ER 10-8 MG/5ML PO SUER
5.0000 mL | Freq: Two times a day (BID) | ORAL | Status: AC
  Administered 2016-08-19 – 2016-08-20 (×4): 5 mL via ORAL
  Filled 2016-08-19 (×4): qty 5

## 2016-08-19 MED ORDER — NICOTINE 21 MG/24HR TD PT24
21.0000 mg | MEDICATED_PATCH | Freq: Every day | TRANSDERMAL | Status: DC
  Filled 2016-08-19 (×3): qty 1

## 2016-08-19 MED ORDER — DEXTROSE 5 % IV SOLN
1.0000 g | Freq: Three times a day (TID) | INTRAVENOUS | Status: DC
  Administered 2016-08-19 – 2016-08-21 (×6): 1 g via INTRAVENOUS
  Filled 2016-08-19 (×9): qty 1

## 2016-08-19 MED ORDER — BENZONATATE 100 MG PO CAPS
200.0000 mg | ORAL_CAPSULE | Freq: Three times a day (TID) | ORAL | Status: DC
  Administered 2016-08-19 – 2016-08-22 (×10): 200 mg via ORAL
  Filled 2016-08-19 (×10): qty 2

## 2016-08-19 NOTE — Progress Notes (Signed)
Notified Schorr, NP that pt had run of bigeminy on telemetry. Pt back into NSR now. Looks like pt has been having runs of bigeminy since admission. NP order magnesium level. Will continue to monitor pt. Nelda Marseille, RN

## 2016-08-19 NOTE — Progress Notes (Signed)
[facility]                                                                              Patient Demographics  Cassidy Miles, is a 50 y.o. female, DOB - [birth year], ZOX:[id number]  Admit date - 08/18/2016   Admitting Physician Haydee Salter, MD  Outpatient Primary MD for the patient is No PCP Per Patient  Outpatient specialists:   LOS - 1  days    Chief Complaint  Patient presents with  . Shortness of Breath       Brief summary   Cassidy Miles is a 50 y.o. female with past medical history significant for anxiety depression and tobacco abuse presented to [facility] with complaint of shortness of breath. Patient stated that 2 weeks ago she began developing upper respiratory tract infection symptoms. The symptoms worsened to the point that she went to the emergency room. There she reported feeling weak,, cough and diarrhea. By x-ray she was diagnosed with pneumonia. She was started on Levaquin and discharged. patient's diarrhea resolved her last bowel movement 2 days PTA. She has however had episodes of chills at night and a constant nonproductive cough accompanied by diffuse chest pain. She has not been smoking. She has been taking her Levaquin. X-ray show multilobular pneumonia. Patient was given vancomycin and Zosyn. CTA was done to rule out PE and was negative.   Assessment & Plan    Active Problems: HCAP/Healthcare associated pneumonia/multifocal pneumonia - Outpatient failure to treatment with Levaquin - Chest x-ray and CT angiogram of the chest consistent with bilateral areas of lung consolidated consistent with multifocal pneumonia - Placed on IV vancomycin and aztreonam (penicillin allergy) - Pain control, nebs, O2 supplementation as needed, incentive spirometry - Follow blood cultures, sputum cultures. Urine strep antigen negative, urine legionella antigen pending   Tobacco abuse - Counseled patient on smoking cessation, place nicotine  patch  Anxiety, depression - Currently stable, not on any antianxiety medications outpatient  Code Status: full DVT Prophylaxis:  Lovenox  Family Communication: Discussed in detail with the patient, all imaging results, lab results explained to the patient   Disposition Plan:   Time Spent in minutes   25 minutes  Procedures:  CTA chest  Consultants:   None   Antimicrobials:   IV vancomycin 10/1>  IV Levaquin 10/1> 10/2  IV aztreonam 10/2>   Medications  Scheduled Meds: . benzonatate  200 mg Oral TID  . chlorpheniramine-HYDROcodone  5 mL Oral Q12H  . enoxaparin (LOVENOX) injection  40 mg Subcutaneous Q24H  . Influenza vac split quadrivalent PF  0.5 mL Intramuscular Tomorrow-1000  . ipratropium-albuterol  3 mL Nebulization TID  . levofloxacin (LEVAQUIN) IV  750 mg Intravenous Q24H  . vancomycin  750 mg Intravenous Q12H   Continuous Infusions:  PRN Meds:.albuterol, fentaNYL (SUBLIMAZE) injection, ketorolac, ondansetron (ZOFRAN) IV, oxyCODONE-acetaminophen, promethazine   Antibiotics   Anti-infectives    Start     Dose/Rate Route Frequency Ordered Stop   08/19/16 1100  levofloxacin (LEVAQUIN) IVPB 750 mg     750 mg 100 mL/hr over 90 Minutes Intravenous Every 24 hours 08/18/16 1049  08/18/16 2300  vancomycin (VANCOCIN) IVPB 750 mg/150 ml premix     750 mg 150 mL/hr over 60 Minutes Intravenous Every 12 hours 08/18/16 1049     08/18/16 1045  levofloxacin (LEVAQUIN) IVPB 750 mg     750 mg 100 mL/hr over 90 Minutes Intravenous  Once 08/18/16 1036 08/18/16 1322   08/18/16 1045  vancomycin (VANCOCIN) IVPB 1000 mg/200 mL premix     1,000 mg 200 mL/hr over 60 Minutes Intravenous  Once 08/18/16 1036 08/18/16 1218        Subjective:   Leany Franchina was seen and examined today. Continues to complain of pleuritic chest pain, worse on coughing. No fevers or chills this morning. Patient denies dizziness, abdominal pain, N/V/D/C, new weakness, numbess, tingling. No  acute events overnight.    Objective:   Vitals:   08/18/16 2025 08/18/16 2108 08/19/16 0703 08/19/16 0823  BP:  (!) 81/42 111/67   Pulse:  81 85   Resp:   16   Temp:   98.3 F (36.8 C)   TempSrc:   Oral   SpO2: 97%  98% 98%    Intake/Output Summary (Last 24 hours) at 08/19/16 1018 Last data filed at 08/19/16 0908  Gross per 24 hour  Intake             4675 ml  Output              330 ml  Net             4345 ml     Wt Readings from Last 3 Encounters:  08/15/16 52.2 kg (115 lb)  01/05/15 59 kg (130 lb)  11/10/14 58.1 kg (128 lb)     Exam  General: Alert and oriented x 3, NAD  HEENT:  PERRLA, EOMI  Neck: Supple, no JVD, no masses  Cardiovascular: S1 S2 auscultated, no rubs, murmurs or gallops. Regular rate and rhythm.  Respiratory: Scattered coarse breath sounds throughout  Gastrointestinal: Soft, nontender, nondistended, + bowel sounds  Ext: no cyanosis clubbing or edema  Neuro: AAOx3, Cr N's II- XII. Strength 5/5 upper and lower extremities bilaterally  Skin: No rashes  Psych: Normal affect and demeanor, alert and oriented x3    Data Reviewed:  I have personally reviewed following labs and imaging studies  Micro Results No results found for this or any previous visit (from the past 240 hour(s)).  Radiology Reports Dg Chest 2 View  Result Date: 08/18/2016 CLINICAL DATA:  Two week history of upper respiratory infection and productive cough. Fever. EXAM: CHEST  2 VIEW COMPARISON:  08/15/2016; 05/29/2014 FINDINGS: Grossly unchanged cardiac silhouette and mediastinal contours with atherosclerotic plaque within thoracic aorta. Worsening bilateral lower lung ill-defined heterogeneous airspace opacities. Mild diffuse slightly nodular thickening of the pulmonary interstitium. No definite pleural effusion or pneumothorax. No evidence of edema. No acute osseus abnormalities. Chondroid lesion within the right humeral metaphysis is grossly unchanged compared to the  01/2013 examination. IMPRESSION: Findings worrisome for progression of bilateral multi focal lower lung pneumonia. A follow-up chest radiograph in 3 to 4 weeks after treatment is recommended to ensure resolution. Electronically Signed   By: Simonne Come M.D.   On: 08/18/2016 11:55   Dg Chest 2 View  Result Date: 08/15/2016 CLINICAL DATA:  Cough and fever with congestion for 2 weeks EXAM: CHEST  2 VIEW COMPARISON:  May 29, 2014 FINDINGS: There is airspace consolidation in the posterior left base. The lungs elsewhere are clear. Heart size and pulmonary vascularity  are normal. No adenopathy. No bone lesions. IMPRESSION: Airspace consolidation consistent with pneumonia posterior left base. Lungs elsewhere clear. Cardiac silhouette within normal limits. Followup PA and lateral chest radiographs recommended in 3-4 weeks following trial of antibiotic therapy to ensure resolution and exclude underlying malignancy. Electronically Signed   By: Bretta Bang III M.D.   On: 08/15/2016 09:41   Ct Angio Chest Pe W Or Wo Contrast  Result Date: 08/18/2016 CLINICAL DATA:  Pt with SOB, chest wall pain, pneumonia ( diagnosed on Thursday), low grade fever at timesCough, non-productive EXAM: CT ANGIOGRAPHY CHEST WITH CONTRAST TECHNIQUE: Multidetector CT imaging of the chest was performed using the standard protocol during bolus administration of intravenous contrast. Multiplanar CT image reconstructions and MIPs were obtained to evaluate the vascular anatomy. CONTRAST:  100 mL of Isovue 370 intravenous contrast COMPARISON:  Current chest radiograph FINDINGS: Cardiovascular: There is no evidence of a pulmonary embolism. The heart is normal in size. There are no significant coronary artery calcifications. The great vessels are normal in caliber. No significant aortic or branch vessel plaque. Mediastinum/Nodes: Small low-density thyroid lesions consistent with sub cm nodules. No mediastinal or hilar masses. Prominent hilar  lymph nodes, largest on the left measuring 9 mm in short axis. Lungs/Pleura: There is multi focal lung consolidation. There is consolidation in both lower lobes, in the left upper lobe lingula and in the anterior inferior right upper lobe and adjacent right middle lobe. There is no pulmonary edema. No lung mass or suspicious nodule. No pleural effusion. Upper Abdomen: No acute abnormality. Musculoskeletal: No chest wall abnormality. No acute or significant osseous findings. Review of the MIP images confirms the above findings. IMPRESSION: 1. No evidence of a pulmonary embolism. 2. Bilateral areas of lung consolidation consistent with multifocal pneumonia. No pulmonary edema. Electronically Signed   By: Amie Portland M.D.   On: 08/18/2016 12:59    Lab Data:  CBC:  Recent Labs Lab 08/15/16 0915 08/18/16 1012 08/18/16 1603 08/19/16 0633  WBC 19.3* 12.7* 11.8* 9.9  NEUTROABS 15.5* 10.0*  --   --   HGB 13.9 13.2 11.3* 10.6*  HCT 39.5 38.5 33.8* 31.9*  MCV 89.4 91.0 92.1 92.2  PLT 332 437* 364 340   Basic Metabolic Panel:  Recent Labs Lab 08/15/16 0915 08/18/16 1012 08/18/16 1603 08/19/16 0633  NA 137 136  --  139  K 4.1 3.7  --  3.7  CL 105 102  --  111  CO2 23 23  --  22  GLUCOSE 107* 170*  --  102*  BUN 14 14  --  6  CREATININE 0.83 0.87 0.73 0.66  CALCIUM 9.1 8.8*  --  7.6*   GFR: Estimated Creatinine Clearance: 67.8 mL/min (by C-G formula based on SCr of 0.66 mg/dL). Liver Function Tests:  Recent Labs Lab 08/15/16 0915 08/18/16 1012  AST 40 33  ALT 65* 35  ALKPHOS 165* 127*  BILITOT 0.9 0.7  PROT 7.5 7.2  ALBUMIN 3.6 3.1*   No results for input(s): LIPASE, AMYLASE in the last 168 hours. No results for input(s): AMMONIA in the last 168 hours. Coagulation Profile: No results for input(s): INR, PROTIME in the last 168 hours. Cardiac Enzymes: No results for input(s): CKTOTAL, CKMB, CKMBINDEX, TROPONINI in the last 168 hours. BNP (last 3 results) No results for  input(s): PROBNP in the last 8760 hours. HbA1C: No results for input(s): HGBA1C in the last 72 hours. CBG:  Recent Labs Lab 08/19/16 0023  GLUCAP 151*  Lipid Profile: No results for input(s): CHOL, HDL, LDLCALC, TRIG, CHOLHDL, LDLDIRECT in the last 72 hours. Thyroid Function Tests: No results for input(s): TSH, T4TOTAL, FREET4, T3FREE, THYROIDAB in the last 72 hours. Anemia Panel: No results for input(s): VITAMINB12, FOLATE, FERRITIN, TIBC, IRON, RETICCTPCT in the last 72 hours. Urine analysis:    Component Value Date/Time   COLORURINE YELLOW 08/18/2016 1317   APPEARANCEUR CLEAR 08/18/2016 1317   LABSPEC 1.030 08/18/2016 1317   PHURINE 6.5 08/18/2016 1317   GLUCOSEU NEGATIVE 08/18/2016 1317   HGBUR NEGATIVE 08/18/2016 1317   BILIRUBINUR NEGATIVE 08/18/2016 1317   KETONESUR 15 (A) 08/18/2016 1317   PROTEINUR NEGATIVE 08/18/2016 1317   UROBILINOGEN 0.2 01/09/2013 0818   NITRITE NEGATIVE 08/18/2016 1317   LEUKOCYTESUR NEGATIVE 08/18/2016 1317     Roswell Ndiaye M.D. [facility] 08/19/2016, 10:18 AM  Pager: [phone] Between 7am to 7pm - call Pager - [phone]  After 7pm go to [email or web address] - password [email or web address]  Call night coverage person covering after 7pm

## 2016-08-19 NOTE — Care Management Note (Addendum)
Case Management Note  Patient Details  Name: Cassidy Miles MRN: [id number] Date of Birth: 11/24/1965  Subjective/Objective:                 Patient admitted with PNA. States she has [facility], active, currently uses it at [facility] to get medications filled. Patient states that she usually goes to urgent care centers for primary care. Patient wanted to choose PCP from those active with [facility] and stated she would look online to for providers. CM offered [facility], patient declined.    Action/Plan:  No further CM needs identified.    Expected Discharge Date:                  Expected Discharge Plan:  Home/Self Care  In-House Referral:  NA  Discharge planning Services  CM Consult  Post Acute Care Choice:  NA Choice offered to:  NA  DME Arranged:  N/A DME Agency:  NA  HH Arranged:  NA HH Agency:  NA  Status of Service:  Completed, signed off  If discussed at Long Length of Stay Meetings, dates discussed:    Additional Comments:  Lawerance Sabal, RN 08/19/2016, 3:36 PM

## 2016-08-19 NOTE — Progress Notes (Signed)
Pharmacy Antibiotic Note  Cassidy Miles is a 50 y.o. female admitted on 08/18/2016 with pneumonia.  Pharmacy has been consulted for Vancomycin and Aztreonam dosing.  Pt reports PCN allergy.  CXR consistent with multilobular PNA.  Currently afebrile.  WBC trending down.  Plan: Continue Vancomycin 750 mg IV q12h Aztreonam 1gm IV q8h    Temp (24hrs), Avg:98.5 F (36.9 C), Min:98.3 F (36.8 C), Max:98.6 F (37 C)   Recent Labs Lab 08/15/16 0915 08/18/16 1012 08/18/16 1047 08/18/16 1356 08/18/16 1603 08/18/16 1822 08/18/16 2032 08/19/16 0633  WBC 19.3* 12.7*  --   --  11.8*  --   --  9.9  CREATININE 0.83 0.87  --   --  0.73  --   --  0.66  LATICACIDVEN  --   --  2.58* 1.21  --  1.6 1.3  --     Estimated Creatinine Clearance: 67.8 mL/min (by C-G formula based on SCr of 0.66 mg/dL).    Allergies  Allergen Reactions  . Tramadol Nausea And Vomiting and Nausea Only  . Cephalexin Hives  . Clarithromycin Hives  . Fioricet [Butalbital-Apap-Caffeine] Nausea And Vomiting  . Penicillins Hives      Has patient had a PCN reaction causing immediate rash, facial/tongue/throat swelling, SOB or lightheadedness with hypotension: Yes Has patient had a PCN reaction causing severe rash involving mucus membranes or skin necrosis: No Has patient had a PCN reaction that required hospitalization: Unknown Has patient had a PCN reaction occurring within the last 10 years: No If all of the above answers are "NO", then may proceed with Cephalosporin use.     Antimicrobials this admission: 10/1 Vancomycin >> 10/1 Levaquin >> 10/2 10/2 Aztreonam >>  Dose adjustments this admission: na  Microbiology results: 10/1 urine 10/1 blood x 2  Thank you for allowing pharmacy to be a part of this patient's care.  [employer], Pharm.D., BCPS Clinical Pharmacist Pager [phone] 08/19/2016 11:05 AM

## 2016-08-20 LAB — CBC
HEMATOCRIT: 30.8 % — AB (ref 36.0–46.0)
Hemoglobin: 10.4 g/dL — ABNORMAL LOW (ref 12.0–15.0)
MCH: 31.4 pg (ref 26.0–34.0)
MCHC: 33.8 g/dL (ref 30.0–36.0)
MCV: 93.1 fL (ref 78.0–100.0)
Platelets: 321 10*3/uL (ref 150–400)
RBC: 3.31 MIL/uL — ABNORMAL LOW (ref 3.87–5.11)
RDW: 13.3 % (ref 11.5–15.5)
WBC: 7.4 10*3/uL (ref 4.0–10.5)

## 2016-08-20 LAB — BASIC METABOLIC PANEL WITH GFR
Anion gap: 7 (ref 5–15)
BUN: <5 mg/dL — ABNORMAL LOW (ref 6–20)
CO2: 24 mmol/L (ref 22–32)
Calcium: 8.1 mg/dL — ABNORMAL LOW (ref 8.9–10.3)
Chloride: 108 mmol/L (ref 101–111)
Creatinine, Ser: 0.61 mg/dL (ref 0.44–1.00)
GFR calc Af Amer: >60 mL/min
GFR calc non Af Amer: >60 mL/min
Glucose, Bld: 100 mg/dL — ABNORMAL HIGH (ref 65–99)
Potassium: 3.8 mmol/L (ref 3.5–5.1)
Sodium: 139 mmol/L (ref 135–145)

## 2016-08-20 LAB — LEGIONELLA PNEUMOPHILA SEROGP 1 UR AG: L. PNEUMOPHILA SEROGP 1 UR AG: NEGATIVE

## 2016-08-20 LAB — MAGNESIUM: Magnesium: 1.6 mg/dL — ABNORMAL LOW (ref 1.7–2.4)

## 2016-08-20 LAB — URINE CULTURE: Culture: NO GROWTH

## 2016-08-20 MED ORDER — WHITE PETROLATUM GEL
Status: AC
  Filled 2016-08-20: qty 1

## 2016-08-20 MED ORDER — POLYETHYLENE GLYCOL 3350 17 G PO PACK: 17.0000 g | PACK | Freq: Every day | ORAL | Status: DC | PRN

## 2016-08-20 MED ORDER — SENNOSIDES-DOCUSATE SODIUM 8.6-50 MG PO TABS
1.0000 | ORAL_TABLET | Freq: Two times a day (BID) | ORAL | Status: DC
Start: 2016-08-20 — End: 2016-08-22
  Administered 2016-08-20 – 2016-08-21 (×3): 1 via ORAL
  Filled 2016-08-20 (×4): qty 1

## 2016-08-20 NOTE — Progress Notes (Signed)
[facility]                                                                              Patient Demographics  Cassidy Miles, is a 50 y.o. female, DOB - [birth year], WUJ:[id number]  Admit date - 08/18/2016   Admitting Physician Haydee Salter, MD  Outpatient Primary MD for the patient is No PCP Per Patient  Outpatient specialists:   LOS - 2  days    Chief Complaint  Patient presents with  . Shortness of Breath       Brief summary   Cassidy Miles is a 50 y.o. female with past medical history significant for anxiety depression and tobacco abuse presented to [facility] with complaint of shortness of breath. Patient stated that 2 weeks ago she began developing upper respiratory tract infection symptoms. The symptoms worsened to the point that she went to the emergency room. There she reported feeling weak,, cough and diarrhea. By x-ray she was diagnosed with pneumonia. She was started on Levaquin and discharged. patient's diarrhea resolved her last bowel movement 2 days PTA. She has however had episodes of chills at night and a constant nonproductive cough accompanied by diffuse chest pain. She has not been smoking. She has been taking her Levaquin. X-ray show multilobular pneumonia. Patient was given vancomycin and Zosyn. CTA was done to rule out PE and was negative.   Assessment & Plan    Active Problems: HCAP/Healthcare associated pneumonia/multifocal pneumonia - Clinically improving - Outpatient failure to treatment with Levaquin - Chest x-ray and CT angiogram of the chest consistent with bilateral areas of lung consolidated consistent with multifocal pneumonia - Placed on IV vancomycin and aztreonam (penicillin allergy) - Pain control, nebs, O2 supplementation as needed, incentive spirometry - Blood cultures negative so far. Urine strep antigen negative, urine legionella antigen pending   Tobacco abuse, polysubstance abuse - Counseled patient on  smoking cessation, place nicotine patch - Urine drug screen positive for opiates and cocaine  Anxiety, depression - Currently stable, not on any antianxiety medications outpatient  Code Status: full DVT Prophylaxis:  Lovenox  Family Communication: Discussed in detail with the patient, all imaging results, lab results explained to the patient   Disposition Plan: Likely DC home in 24-48 hours  Time Spent in minutes   25 minutes  Procedures:  CTA chest  Consultants:   None   Antimicrobials:   IV vancomycin 10/1>  IV Levaquin 10/1> 10/2  IV aztreonam 10/2>   Medications  Scheduled Meds: . aztreonam  1 g Intravenous Q8H  . benzonatate  200 mg Oral TID  . chlorpheniramine-HYDROcodone  5 mL Oral Q12H  . enoxaparin (LOVENOX) injection  40 mg Subcutaneous Q24H  . Influenza vac split quadrivalent PF  0.5 mL Intramuscular Tomorrow-1000  . ipratropium-albuterol  3 mL Nebulization TID  . nicotine  21 mg Transdermal Daily  . senna-docusate  1 tablet Oral BID  . vancomycin  750 mg Intravenous Q12H  . white petrolatum       Continuous Infusions:  PRN Meds:.albuterol, fentaNYL (SUBLIMAZE) injection, ketorolac, ondansetron (ZOFRAN) IV, oxyCODONE-acetaminophen, polyethylene glycol, promethazine   Antibiotics  Anti-infectives    Start     Dose/Rate Route Frequency Ordered Stop   08/19/16 1400  aztreonam (AZACTAM) 1 g in dextrose 5 % 50 mL IVPB     1 g 100 mL/hr over 30 Minutes Intravenous Every 8 hours 08/19/16 1108     08/19/16 1100  levofloxacin (LEVAQUIN) IVPB 750 mg  Status:  Discontinued     750 mg 100 mL/hr over 90 Minutes Intravenous Every 24 hours 08/18/16 1049 08/19/16 1021   08/18/16 2300  vancomycin (VANCOCIN) IVPB 750 mg/150 ml premix     750 mg 150 mL/hr over 60 Minutes Intravenous Every 12 hours 08/18/16 1049     08/18/16 1045  levofloxacin (LEVAQUIN) IVPB 750 mg     750 mg 100 mL/hr over 90 Minutes Intravenous  Once 08/18/16 1036 08/18/16 1322   08/18/16  1045  vancomycin (VANCOCIN) IVPB 1000 mg/200 mL premix     1,000 mg 200 mL/hr over 60 Minutes Intravenous  Once 08/18/16 1036 08/18/16 1218        Subjective:   Cassidy Miles was seen and examined today. Feels a lot better today, pruritic chest pain improving. Still coughing through the encounter. No fevers or chills this morning. Patient denies dizziness, abdominal pain, N/V/D/C, new weakness, numbess, tingling. No acute events overnight.    Objective:   Vitals:   08/19/16 1952 08/19/16 2126 08/20/16 0054 08/20/16 0617  BP:  (!) 112/59  111/69  Pulse:  98  84  Resp:  20  20  Temp:  98.3 F (36.8 C)  98.1 F (36.7 C)  TempSrc:  Oral  Oral  SpO2: 98% 95% 97% 95%    Intake/Output Summary (Last 24 hours) at 08/20/16 1213 Last data filed at 08/19/16 2316  Gross per 24 hour  Intake              200 ml  Output                0 ml  Net              200 ml     Wt Readings from Last 3 Encounters:  08/15/16 52.2 kg (115 lb)  01/05/15 59 kg (130 lb)  11/10/14 58.1 kg (128 lb)     Exam  General: Alert and oriented x 3, NAD  HEENT:  PERRLA, EOMI  Neck:   Cardiovascular: S1 S2 clear, RRR  Respiratory: Scattered wheezing  Gastrointestinal: Soft, nontender, nondistended, + bowel sounds  Ext: no cyanosis clubbing or edema  Neuro:No new deficits  Skin: No rashes  Psych: Normal affect and demeanor, alert and oriented x3    Data Reviewed:  I have personally reviewed following labs and imaging studies  Micro Results Recent Results (from the past 240 hour(s))  Blood Culture (routine x 2)     Status: None (Preliminary result)   Collection Time: 08/18/16 10:55 AM  Result Value Ref Range Status   Specimen Description BLOOD RIGHT AC  Final   Special Requests BOTTLES DRAWN AEROBIC AND ANAEROBIC EACH  Final   Culture   Final    NO GROWTH 1 DAY Performed at [facility]    Report Status PENDING  Incomplete  Blood Culture (routine x 2)     Status: None  (Preliminary result)   Collection Time: 08/18/16 10:55 AM  Result Value Ref Range Status   Specimen Description BLOOD RIGHT HAND  Final   Special Requests BOTTLES DRAWN AEROBIC ONLY 5 ML  Final  Culture   Final    NO GROWTH 1 DAY Performed at [facility]    Report Status PENDING  Incomplete  Urine culture     Status: None   Collection Time: 08/18/16  1:17 PM  Result Value Ref Range Status   Specimen Description URINE, RANDOM  Final   Special Requests NONE  Final   Culture NO GROWTH Performed at [facility]   Final   Report Status 08/20/2016 FINAL  Final    Radiology Reports Dg Chest 2 View  Result Date: 08/18/2016 CLINICAL DATA:  Two week history of upper respiratory infection and productive cough. Fever. EXAM: CHEST  2 VIEW COMPARISON:  08/15/2016; 05/29/2014 FINDINGS: Grossly unchanged cardiac silhouette and mediastinal contours with atherosclerotic plaque within thoracic aorta. Worsening bilateral lower lung ill-defined heterogeneous airspace opacities. Mild diffuse slightly nodular thickening of the pulmonary interstitium. No definite pleural effusion or pneumothorax. No evidence of edema. No acute osseus abnormalities. 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 IMPRESSION: Findings worrisome for progression of bilateral multi focal lower lung pneumonia. A follow-up chest radiograph in 3 to 4 weeks after treatment is recommended to ensure resolution. Electronically Signed   By: Simonne ComeJohn  Watts M.D.   On: 08/18/2016 11:55   Dg Chest 2 View  Result Date: 08/15/2016 CLINICAL DATA:  Cough and fever with congestion for 2 weeks EXAM: CHEST  2 VIEW COMPARISON:  May 29, 2014 FINDINGS: There is airspace consolidation in the posterior left base. The lungs elsewhere are clear. Heart size and pulmonary vascularity are normal. No adenopathy. No bone lesions. IMPRESSION: Airspace consolidation consistent with  pneumonia posterior left base. Lungs elsewhere clear. Cardiac silhouette within normal limits. Followup PA and lateral chest radiographs recommended in 3-4 weeks following trial of antibiotic therapy to ensure resolution and exclude underlying malignancy. Electronically Signed   By: Bretta BangWilliam  Woodruff III M.D.   On: 08/15/2016 09:41   Ct Angio Chest Pe W Or Wo Contrast  Result Date: 08/18/2016 CLINICAL DATA:  Pt with SOB, chest wall pain, pneumonia ( diagnosed on Thursday), low grade fever at timesCough, non-productive EXAM: CT ANGIOGRAPHY CHEST WITH CONTRAST TECHNIQUE: Multidetector CT imaging of the chest was performed using the standard protocol during bolus administration of intravenous contrast. Multiplanar CT image reconstructions and MIPs were obtained to evaluate the vascular anatomy. CONTRAST:  100 mL of Isovue 370 intravenous contrast COMPARISON:  Current chest radiograph FINDINGS: Cardiovascular: There is no evidence of a pulmonary embolism. The heart is normal in size. There are no significant coronary artery calcifications. The great vessels are normal in caliber. No significant aortic or branch vessel plaque. Mediastinum/Nodes: Small low-density thyroid lesions consistent with sub cm nodules. No mediastinal or hilar masses. Prominent hilar lymph nodes, largest on the left measuring 9 mm in short axis. Lungs/Pleura: There is multi focal lung consolidation. There is consolidation in both lower lobes, in the left upper lobe lingula and in the anterior inferior right upper lobe and adjacent right middle lobe. There is no pulmonary edema. No lung mass or suspicious nodule. No pleural effusion. Upper Abdomen: No acute abnormality. Musculoskeletal: No chest wall abnormality. No acute or significant osseous findings. Review of the MIP images confirms the above findings. IMPRESSION: 1. No evidence of a pulmonary embolism. 2. Bilateral areas of lung consolidation consistent with multifocal pneumonia. No  pulmonary edema. Electronically Signed   By: Amie Portlandavid  Ormond M.D.   On: 08/18/2016 12:59    Lab Data:  CBC:  Recent Labs Lab 08/15/16 0915 08/18/16 1012 08/18/16 1603 08/19/16 0633 08/20/16 0433  WBC 19.3* 12.7* 11.8* 9.9 7.4  NEUTROABS 15.5* 10.0*  --   --   --   HGB 13.9 13.2 11.3* 10.6* 10.4*  HCT 39.5 38.5 33.8* 31.9* 30.8*  MCV 89.4 91.0 92.1 92.2 93.1  PLT 332 437* 364 340 321   Basic Metabolic Panel:  Recent Labs Lab 08/15/16 0915 08/18/16 1012 08/18/16 1603 08/19/16 0633 08/20/16 0433  NA 137 136  --  139 139  K 4.1 3.7  --  3.7 3.8  CL 105 102  --  111 108  CO2 23 23  --  22 24  GLUCOSE 107* 170*  --  102* 100*  BUN 14 14  --  6 <5*  CREATININE 0.83 0.87 0.73 0.66 0.61  CALCIUM 9.1 8.8*  --  7.6* 8.1*  MG  --   --   --   --  1.6*   GFR: Estimated Creatinine Clearance: 67.8 mL/min (by C-G formula based on SCr of 0.61 mg/dL). Liver Function Tests:  Recent Labs Lab 08/15/16 0915 08/18/16 1012  AST 40 33  ALT 65* 35  ALKPHOS 165* 127*  BILITOT 0.9 0.7  PROT 7.5 7.2  ALBUMIN 3.6 3.1*   No results for input(s): LIPASE, AMYLASE in the last 168 hours. No results for input(s): AMMONIA in the last 168 hours. Coagulation Profile: No results for input(s): INR, PROTIME in the last 168 hours. Cardiac Enzymes: No results for input(s): CKTOTAL, CKMB, CKMBINDEX, TROPONINI in the last 168 hours. BNP (last 3 results) No results for input(s): PROBNP in the last 8760 hours. HbA1C: No results for input(s): HGBA1C in the last 72 hours. CBG:  Recent Labs Lab 08/19/16 0023  GLUCAP 151*   Lipid Profile: No results for input(s): CHOL, HDL, LDLCALC, TRIG, CHOLHDL, LDLDIRECT in the last 72 hours. Thyroid Function Tests: No results for input(s): TSH, T4TOTAL, FREET4, T3FREE, THYROIDAB in the last 72 hours. Anemia Panel: No results for input(s): VITAMINB12, FOLATE, FERRITIN, TIBC, IRON, RETICCTPCT in the last 72 hours. Urine analysis:    Component Value  Date/Time   COLORURINE YELLOW 08/18/2016 1317   APPEARANCEUR CLEAR 08/18/2016 1317   LABSPEC 1.030 08/18/2016 1317   PHURINE 6.5 08/18/2016 1317   GLUCOSEU NEGATIVE 08/18/2016 1317   HGBUR NEGATIVE 08/18/2016 1317   BILIRUBINUR NEGATIVE 08/18/2016 1317   KETONESUR 15 (A) 08/18/2016 1317   PROTEINUR NEGATIVE 08/18/2016 1317   UROBILINOGEN 0.2 01/09/2013 0818   NITRITE NEGATIVE 08/18/2016 1317   LEUKOCYTESUR NEGATIVE 08/18/2016 1317     Din Bookwalter M.D. [facility] 08/20/2016, 12:13 PM  Pager: [phone] Between 7am to 7pm - call Pager - [phone]  After 7pm go to [email or web address] - password [email or web address]  Call night coverage person covering after 7pm

## 2016-08-21 LAB — CBC
HEMATOCRIT: 35.4 % — AB (ref 36.0–46.0)
Hemoglobin: 11.5 g/dL — ABNORMAL LOW (ref 12.0–15.0)
MCH: 30.7 pg (ref 26.0–34.0)
MCHC: 32.5 g/dL (ref 30.0–36.0)
MCV: 94.4 fL (ref 78.0–100.0)
Platelets: 388 10*3/uL (ref 150–400)
RBC: 3.75 MIL/uL — ABNORMAL LOW (ref 3.87–5.11)
RDW: 13.2 % (ref 11.5–15.5)
WBC: 7.3 10*3/uL (ref 4.0–10.5)

## 2016-08-21 LAB — BASIC METABOLIC PANEL
Anion gap: 5 (ref 5–15)
BUN: 6 mg/dL (ref 6–20)
CALCIUM: 8.3 mg/dL — AB (ref 8.9–10.3)
CO2: 28 mmol/L (ref 22–32)
CREATININE: 0.66 mg/dL (ref 0.44–1.00)
Chloride: 104 mmol/L (ref 101–111)
GFR calc Af Amer: >60 mL/min (ref >60)
GFR calc non Af Amer: >60 mL/min (ref >60)
GLUCOSE: 94 mg/dL (ref 65–99)
Potassium: 4 mmol/L (ref 3.5–5.1)
Sodium: 137 mmol/L (ref 135–145)

## 2016-08-21 LAB — VANCOMYCIN, TROUGH: Vancomycin Tr: 11 ug/mL — ABNORMAL LOW (ref 15–20)

## 2016-08-21 MED ORDER — HYDROCODONE-ACETAMINOPHEN 5-325 MG PO TABS: 1.0000 | ORAL_TABLET | Freq: Four times a day (QID) | ORAL | Status: DC | PRN

## 2016-08-21 MED ORDER — SULFAMETHOXAZOLE-TRIMETHOPRIM 800-160 MG PO TABS
1.0000 | ORAL_TABLET | Freq: Two times a day (BID) | ORAL | Status: DC
Start: 2016-08-21 — End: 2016-08-22
  Administered 2016-08-21 – 2016-08-22 (×2): 1 via ORAL
  Filled 2016-08-21 (×2): qty 1

## 2016-08-21 MED ORDER — IPRATROPIUM-ALBUTEROL 0.5-2.5 (3) MG/3ML IN SOLN
3.0000 mL | Freq: Two times a day (BID) | RESPIRATORY_TRACT | Status: DC
  Administered 2016-08-21 – 2016-08-22 (×2): 3 mL via RESPIRATORY_TRACT
  Filled 2016-08-21 (×2): qty 3

## 2016-08-21 MED ORDER — VANCOMYCIN HCL IN DEXTROSE 1-5 GM/200ML-% IV SOLN
1000.0000 mg | Freq: Two times a day (BID) | INTRAVENOUS | Status: DC
  Filled 2016-08-21: qty 200

## 2016-08-21 MED ORDER — DICLOFENAC SODIUM 75 MG PO TBEC
75.0000 mg | DELAYED_RELEASE_TABLET | Freq: Two times a day (BID) | ORAL | Status: DC | PRN
  Filled 2016-08-21: qty 1

## 2016-08-21 MED ORDER — HYDROCOD POLST-CPM POLST ER 10-8 MG/5ML PO SUER
5.0000 mL | Freq: Two times a day (BID) | ORAL | Status: DC
  Administered 2016-08-21 – 2016-08-22 (×3): 5 mL via ORAL
  Filled 2016-08-21 (×3): qty 5

## 2016-08-21 MED ORDER — ACETAMINOPHEN 325 MG PO TABS: 650.0000 mg | ORAL_TABLET | Freq: Four times a day (QID) | ORAL | Status: DC | PRN

## 2016-08-21 MED ORDER — HYDROCODONE-ACETAMINOPHEN 5-325 MG PO TABS
2.0000 | ORAL_TABLET | Freq: Once | ORAL | Status: AC
  Administered 2016-08-21: 2 via ORAL
  Filled 2016-08-21: qty 2

## 2016-08-21 MED ORDER — DM-GUAIFENESIN ER 30-600 MG PO TB12
1.0000 | ORAL_TABLET | Freq: Two times a day (BID) | ORAL | Status: DC
  Administered 2016-08-21: 1 via ORAL
  Filled 2016-08-21: qty 1

## 2016-08-21 NOTE — Progress Notes (Signed)
PROGRESS NOTE    PHILLIS THACKERAY  ZOX:[id number] DOB: 1966-04-24 DOA: 08/18/2016 PCP: No PCP Per Patient   Brief Narrative: SONNY POTH is a 50 y.o. femalewith past medical history significant for anxiety depression and tobacco abuse presented to [facility] with complaint of shortness of breath. Patient stated that 2 weeks ago she began developing upper respiratory tract infection symptoms. The symptoms worsened to the point that she went to the emergency room. There she reported feeling weak, cough and diarrhea. By x-ray she was diagnosed with pneumonia. She was started on Levaquin and discharged. Patient's diarrhea resolved; her last bowel movement 2 days PTA. She has however had episodes of chills at night and a constant nonproductive cough accompanied by diffuse chest pain. She has not been smoking. She has been taking her Levaquin. X-ray show multilobular pneumonia. Patient was given vancomycin and Zosyn. CTA was done to rule out PE and was negative.   Assessment & Plan:   Active Problems:   Community acquired pneumonia   PNA (pneumonia)   HCAP Multifocal pneumonia Improving clinically. -discontinue vancomycin and aztreonam -discussed with Dr. Ninetta Lights. With penicillin allergies and recent failure on Levaquin, suggested Augmentin, however patient refuses secondary to remote hives allergy. Will start Bactrim DS -Tussionex -will switch to diclofenac  Tobacco abuse Polysubstance abuse UDS positive for opiates and cocaine -nicotine patch  Anxiety Depression Stable   DVT prophylaxis: Lovenox Code Status: Full code Family Communication: None at bedside Disposition Plan: Discharge tomorrow   Consultants:   None  Procedures:  None  Antimicrobials:  Vancomycin (10/2>>10/4)  Aztreonam (10/2>>10/4)  Bactrim (10/4>>   Subjective: Patient reports pleuritic chest pain with coughing. No dyspnea.  Objective: Vitals:   08/20/16 2109 08/20/16 2117  08/21/16 0516 08/21/16 1343  BP:  107/60 (!) 103/58 101/65  Pulse: (!) 58 71 (!) 59 68  Resp: 18 20 20 18   Temp:  98.1 F (36.7 C) 98.2 F (36.8 C) 98.1 F (36.7 C)  TempSrc:  Oral Oral Oral  SpO2:  100% 97% 97%    Intake/Output Summary (Last 24 hours) at 08/21/16 1728 Last data filed at 08/21/16 1300  Gross per 24 hour  Intake              712 ml  Output                0 ml  Net              712 ml   There were no vitals filed for this visit.  Examination:  General exam: Appears calm and comfortable Respiratory system: Diminished at bases bilaterally with mild crackles Cardiovascular system: S1 & S2 heard, RRR. No murmurs, rubs, gallops or clicks. Gastrointestinal system: Abdomen is nondistended, soft and nontender. Normal bowel sounds heard. Central nervous system: Alert and oriented. No focal neurological deficits. Extremities: No edema. No calf tenderness Skin: No cyanosis. No rashes Psychiatry: Judgement and insight appear normal. Mood & affect appropriate.     Data Reviewed: I have personally reviewed following labs and imaging studies  CBC:  Recent Labs Lab 08/15/16 0915 08/18/16 1012 08/18/16 1603 08/19/16 0633 08/20/16 0433 08/21/16 0447  WBC 19.3* 12.7* 11.8* 9.9 7.4 7.3  NEUTROABS 15.5* 10.0*  --   --   --   --   HGB 13.9 13.2 11.3* 10.6* 10.4* 11.5*  HCT 39.5 38.5 33.8* 31.9* 30.8* 35.4*  MCV 89.4 91.0 92.1 92.2 93.1 94.4  PLT 332 437* 364 340 321 388  Basic Metabolic Panel:  Recent Labs Lab 08/15/16 0915 08/18/16 1012 08/18/16 1603 08/19/16 0633 08/20/16 0433 08/21/16 0447  NA 137 136  --  139 139 137  K 4.1 3.7  --  3.7 3.8 4.0  CL 105 102  --  111 108 104  CO2 23 23  --  22 24 28   GLUCOSE 107* 170*  --  102* 100* 94  BUN 14 14  --  6 <5* 6  CREATININE 0.83 0.87 0.73 0.66 0.61 0.66  CALCIUM 9.1 8.8*  --  7.6* 8.1* 8.3*  MG  --   --   --   --  1.6*  --    GFR: Estimated Creatinine Clearance: 67.8 mL/min (by C-G formula based on  SCr of 0.66 mg/dL). Liver Function Tests:  Recent Labs Lab 08/15/16 0915 08/18/16 1012  AST 40 33  ALT 65* 35  ALKPHOS 165* 127*  BILITOT 0.9 0.7  PROT 7.5 7.2  ALBUMIN 3.6 3.1*   No results for input(s): LIPASE, AMYLASE in the last 168 hours. No results for input(s): AMMONIA in the last 168 hours. Coagulation Profile: No results for input(s): INR, PROTIME in the last 168 hours. Cardiac Enzymes: No results for input(s): CKTOTAL, CKMB, CKMBINDEX, TROPONINI in the last 168 hours. BNP (last 3 results) No results for input(s): PROBNP in the last 8760 hours. HbA1C: No results for input(s): HGBA1C in the last 72 hours. CBG:  Recent Labs Lab 08/19/16 0023  GLUCAP 151*   Lipid Profile: No results for input(s): CHOL, HDL, LDLCALC, TRIG, CHOLHDL, LDLDIRECT in the last 72 hours. Thyroid Function Tests: No results for input(s): TSH, T4TOTAL, FREET4, T3FREE, THYROIDAB in the last 72 hours. Anemia Panel: No results for input(s): VITAMINB12, FOLATE, FERRITIN, TIBC, IRON, RETICCTPCT in the last 72 hours. Sepsis Labs:  Recent Labs Lab 08/18/16 1047 08/18/16 1356 08/18/16 1822 08/18/16 2032  LATICACIDVEN 2.58* 1.21 1.6 1.3    Recent Results (from the past 240 hour(s))  Blood Culture (routine x 2)     Status: None (Preliminary result)   Collection Time: 08/18/16 10:55 AM  Result Value Ref Range Status   Specimen Description BLOOD RIGHT [facility]  Final   Special Requests BOTTLES DRAWN AEROBIC AND ANAEROBIC 5ML EACH  Final   Culture   Final    NO GROWTH 3 DAYS Performed at [facility]    Report Status PENDING  Incomplete  Blood Culture (routine x 2)     Status: None (Preliminary result)   Collection Time: 08/18/16 10:55 AM  Result Value Ref Range Status   Specimen Description BLOOD RIGHT HAND  Final   Special Requests BOTTLES DRAWN AEROBIC ONLY 5 ML  Final   Culture   Final    NO GROWTH 3 DAYS Performed at [facility]    Report Status PENDING  Incomplete    Urine culture     Status: None   Collection Time: 08/18/16  1:17 PM  Result Value Ref Range Status   Specimen Description URINE, RANDOM  Final   Special Requests NONE  Final   Culture NO GROWTH Performed at [facility]   Final   Report Status 08/20/2016 FINAL  Final         Radiology Studies: No results found.      Scheduled Meds: . aztreonam  1 g Intravenous Q8H  . benzonatate  200 mg Oral TID  . chlorpheniramine-HYDROcodone  5 mL Oral Q12H  . enoxaparin (LOVENOX) injection  40 mg Subcutaneous Q24H  .  Influenza vac split quadrivalent PF  0.5 mL Intramuscular Tomorrow-1000  . ipratropium-albuterol  3 mL Nebulization BID  . nicotine  21 mg Transdermal Daily  . senna-docusate  1 tablet Oral BID  . vancomycin  1,000 mg Intravenous Q12H   Continuous Infusions:    LOS: 3 days     Jacquelin Hawking [facility] 08/21/2016, 5:28 PM Pager: [phone]  If 7PM-7AM, please contact night-coverage [email or web address] Password [email or web address] 08/21/2016, 5:28 PM

## 2016-08-21 NOTE — Progress Notes (Signed)
Pharmacy Antibiotic Note Cassidy Miles is a 50 y.o. female admitted on 08/18/2016 with pneumonia. Currently on day 3 of Aztreonam and vancomycin for treatment.   Vancomycin trough of 11 drawn earlier today is lightly below goal of 15 -20. SCr stable.   Plan: 1. Increase vancomycin to 1 gram IV every 12 hours  2. Continue Aztreonam 1gm IV q8h   Temp (24hrs), Avg:98.2 F (36.8 C), Min:98.1 F (36.7 C), Max:98.3 F (36.8 C)   Recent Labs Lab 08/18/16 1012 08/18/16 1047 08/18/16 1356 08/18/16 1603 08/18/16 1822 08/18/16 2032 08/19/16 4854 08/20/16 0433 08/21/16 0447 08/21/16 1017  WBC 12.7*  --   --  11.8*  --   --  9.9 7.4 7.3  --   CREATININE 0.87  --   --  0.73  --   --  0.66 0.61 0.66  --   LATICACIDVEN  --  2.58* 1.21  --  1.6 1.3  --   --   --   --   VANCOTROUGH  --   --   --   --   --   --   --   --   --  11*    Estimated Creatinine Clearance: 67.8 mL/min (by C-G formula based on SCr of 0.66 mg/dL).    Allergies  Allergen Reactions  . Tramadol Nausea And Vomiting and Nausea Only  . Cephalexin Hives  . Clarithromycin Hives  . Fioricet [Butalbital-Apap-Caffeine] Nausea And Vomiting  . Penicillins Hives      Has patient had a PCN reaction causing immediate rash, facial/tongue/throat swelling, SOB or lightheadedness with hypotension: Yes Has patient had a PCN reaction causing severe rash involving mucus membranes or skin necrosis: No Has patient had a PCN reaction that required hospitalization: Unknown Has patient had a PCN reaction occurring within the last 10 years: No If all of the above answers are "NO", then may proceed with Cephalosporin use.    Antimicrobials this admission:  10/1 Vancomycin >>  10/2 Aztreonam >> 10/1 Levaquin >> 10/2  Dose adjustments this admission: na  Microbiology results: 10/1 urine: ngF 10/1 blood x 2: ngtd  Thank you for allowing pharmacy to be a part of this patient's care.  Pollyann Samples, PharmD, BCPS 08/21/2016, 12:37  PM Pager: [phone]

## 2016-08-21 NOTE — Progress Notes (Signed)
Notified Schorr, NP that pt's pain is 9 in chest. Requesting pain medication other than Voltaren ordered. NP gave verbal order for Norco 5/325mg  2 tabs x1. Will continue to monitor pt. Nelda Marseille, RN

## 2016-08-22 DIAGNOSIS — F418 Other specified anxiety disorders: Secondary | ICD-10-CM

## 2016-08-22 DIAGNOSIS — Z720 Tobacco use: Secondary | ICD-10-CM

## 2016-08-22 DIAGNOSIS — J189 Pneumonia, unspecified organism: Principal | ICD-10-CM

## 2016-08-22 LAB — CBC
HEMATOCRIT: 35.3 % — AB (ref 36.0–46.0)
Hemoglobin: 11.6 g/dL — ABNORMAL LOW (ref 12.0–15.0)
MCH: 30.6 pg (ref 26.0–34.0)
MCHC: 32.9 g/dL (ref 30.0–36.0)
MCV: 93.1 fL (ref 78.0–100.0)
Platelets: 405 10*3/uL — ABNORMAL HIGH (ref 150–400)
RBC: 3.79 MIL/uL — ABNORMAL LOW (ref 3.87–5.11)
RDW: 13 % (ref 11.5–15.5)
WBC: 7.4 10*3/uL (ref 4.0–10.5)

## 2016-08-22 LAB — BASIC METABOLIC PANEL
ANION GAP: 12 (ref 5–15)
BUN: 7 mg/dL (ref 6–20)
CALCIUM: 8.7 mg/dL — AB (ref 8.9–10.3)
CO2: 29 mmol/L (ref 22–32)
CREATININE: 0.77 mg/dL (ref 0.44–1.00)
Chloride: 98 mmol/L — ABNORMAL LOW (ref 101–111)
Glucose, Bld: 91 mg/dL (ref 65–99)
Potassium: 4.4 mmol/L (ref 3.5–5.1)
SODIUM: 139 mmol/L (ref 135–145)

## 2016-08-22 MED ORDER — HYDROCODONE-ACETAMINOPHEN 5-325 MG PO TABS
1.0000 | ORAL_TABLET | Freq: Once | ORAL | Status: AC
  Administered 2016-08-22: 1 via ORAL
  Filled 2016-08-22: qty 1

## 2016-08-22 MED ORDER — BENZONATATE 200 MG PO CAPS: 200.0000 mg | ORAL_CAPSULE | Freq: Three times a day (TID) | ORAL | 0 refills | Status: DC

## 2016-08-22 MED ORDER — HYDROCODONE-ACETAMINOPHEN 5-325 MG PO TABS
2.0000 | ORAL_TABLET | Freq: Once | ORAL | Status: AC
  Administered 2016-08-22: 2 via ORAL
  Filled 2016-08-22: qty 2

## 2016-08-22 MED ORDER — HYDROCODONE-ACETAMINOPHEN 5-325 MG PO TABS: 1.0000 | ORAL_TABLET | Freq: Four times a day (QID) | ORAL | 0 refills | Status: DC | PRN

## 2016-08-22 MED ORDER — SULFAMETHOXAZOLE-TRIMETHOPRIM 800-160 MG PO TABS: 1.0000 | ORAL_TABLET | Freq: Two times a day (BID) | ORAL | 0 refills | Status: AC

## 2016-08-22 MED ORDER — IPRATROPIUM-ALBUTEROL 0.5-2.5 (3) MG/3ML IN SOLN: 3.0000 mL | Freq: Four times a day (QID) | RESPIRATORY_TRACT | Status: DC | PRN

## 2016-08-22 MED ORDER — UNABLE TO FIND: 0 refills | Status: DC

## 2016-08-22 NOTE — Discharge Summary (Signed)
Physician Discharge Summary  Cassidy BarbanCindy M Souders UJW:[id number]:[id number] DOB: 1965/12/07 DOA: 08/18/2016  PCP: No PCP Per Patient  Admit date: 08/18/2016 Discharge date: 08/22/2016  Time spent: 45 minutes  Recommendations for Outpatient Follow-up:  1. PCP in 1 week 2. FU CXR in 4-6weeks   Discharge Diagnoses:  Active Problems:   Community acquired pneumonia   PNA (pneumonia)   Discharge Condition: stable  Diet recommendation: regular  Filed Weights   08/22/16 1032  Weight: 52.2 kg (115 lb)    History of present illness:  Cassidy Miles is a 50 y.o. femalewith past medical history significant for anxiety depression and tobacco abuse presented to [facility] with complaint of shortness of breath. Patient stated that 2 weeks agoshe began developing upper respiratory tract infection symptoms. The symptoms worsened to the point that she went to the emergency room. There she reported feeling weak, cough and diarrhea. By x-ray she was diagnosed with pneumonia. She was started on Levaquin and discharged. Patient's diarrhea resolved; her last bowel movement 2 days PTA. She has however had episodes of chills at night and a constant nonproductive cough accompanied by diffuse chest pain. She has not been smoking. She has been taking her Levaquin. CT chest showed multilobular pneumonia  Hospital Course:  Multifocal pneumonia/CAP -failed outpatient treatment with levaquin -improved with IV Vanc and Zosyn, Blood Cx negative -Dr.Nettey discussed with Dr. Ninetta LightsHatcher, given Levaquin failure, PCN and cephalosporin allergy and it was decided to treat her with Bactrim DS. -She is stable today, this is my first time seeing her and is advised to continue same Abx for 3more days to complete 7day course and continue cough medications for few more days -needs FU CXR in 4-6weeks  Tobacco abuse/Polysubstance abuse UDS positive for opiates and cocaine -nicotine  patch  Anxiety/Depression Stable  Consultations:  Dr.Nettey d/w Dr.Hatcher   Discharge Exam: Vitals:   08/21/16 2153 08/22/16 0534  BP: (!) 116/52 105/63  Pulse: 81 74  Resp: 18 18  Temp: 99.4 F (37.4 C) 99.4 F (37.4 C)    General: AAOx3 Cardiovascular: S1s2/RRR Respiratory: CTAB  Discharge Instructions   Discharge Instructions    Diet - low sodium heart healthy    Complete by:  As directed    Increase activity slowly    Complete by:  As directed      Discharge Medication List as of 08/22/2016  1:07 PM    START taking these medications   Details  benzonatate (TESSALON) 200 MG capsule Take 1 capsule (200 mg total) by mouth 3 (three) times daily., Starting Thu 08/22/2016, Print    sulfamethoxazole-trimethoprim (BACTRIM DS,SEPTRA DS) 800-160 MG tablet Take 1 tablet by mouth every 12 (twelve) hours. For 3days total including today, Starting Thu 08/22/2016, Normal    UNABLE TO FIND This note is to excuse Ms.Terie Purserindy Keese from work due to medical illness requiring hospitalization 10/1 -10/5, ok to return to work on 10/10, Print      CONTINUE these medications which have CHANGED   Details  HYDROcodone-acetaminophen (NORCO/VICODIN) 5-325 MG tablet Take 1-2 tablets by mouth every 6 (six) hours as needed for moderate pain., Starting Thu 08/22/2016, Print      CONTINUE these medications which have NOT CHANGED   Details  dextromethorphan-guaiFENesin (MUCINEX DM) 30-600 MG 12hr tablet Take 1 tablet by mouth 2 (two) times daily., Starting Thu 08/15/2016, Print      STOP taking these medications     promethazine (PHENERGAN) 25 MG tablet  Allergies  Allergen Reactions  . Tramadol Nausea And Vomiting and Nausea Only  . Cephalexin Hives  . Clarithromycin Hives  . Fioricet [Butalbital-Apap-Caffeine] Nausea And Vomiting  . Penicillins Hives      Has patient had a PCN reaction causing immediate rash, facial/tongue/throat swelling, SOB or lightheadedness with  hypotension: Yes Has patient had a PCN reaction causing severe rash involving mucus membranes or skin necrosis: No Has patient had a PCN reaction that required hospitalization: Unknown Has patient had a PCN reaction occurring within the last 10 years: No If all of the above answers are "NO", then may proceed with Cephalosporin use.       The results of significant diagnostics from this hospitalization (including imaging, microbiology, ancillary and laboratory) are listed below for reference.    Significant Diagnostic Studies: Dg Chest 2 View  Result Date: 08/18/2016 CLINICAL DATA:  Two week history of upper respiratory infection and productive cough. Fever. EXAM: CHEST  2 VIEW COMPARISON:  08/15/2016; 05/29/2014 FINDINGS: Grossly unchanged cardiac silhouette and mediastinal contours with atherosclerotic plaque within thoracic aorta. Worsening bilateral lower lung ill-defined heterogeneous airspace opacities. Mild diffuse slightly nodular thickening of the pulmonary interstitium. No definite pleural effusion or pneumothorax. No evidence of edema. No acute osseus abnormalities. 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 IMPRESSION: Findings worrisome for progression of bilateral multi focal lower lung pneumonia. A follow-up chest radiograph in 3 to 4 weeks after treatment is recommended to ensure resolution. Electronically Signed   By: Simonne Come M.D.   On: 08/18/2016 11:55   Dg Chest 2 View  Result Date: 08/15/2016 CLINICAL DATA:  Cough and fever with congestion for 2 weeks EXAM: CHEST  2 VIEW COMPARISON:  May 29, 2014 FINDINGS: There is airspace consolidation in the posterior left base. The lungs elsewhere are clear. Heart size and pulmonary vascularity are normal. No adenopathy. No bone lesions. IMPRESSION: Airspace consolidation consistent with pneumonia posterior left base. Lungs elsewhere clear. Cardiac silhouette within  normal limits. Followup PA and lateral chest radiographs recommended in 3-4 weeks following trial of antibiotic therapy to ensure resolution and exclude underlying malignancy. Electronically Signed   By: Bretta Bang III M.D.   On: 08/15/2016 09:41   Ct Angio Chest Pe W Or Wo Contrast  Result Date: 08/18/2016 CLINICAL DATA:  Pt with SOB, chest wall pain, pneumonia ( diagnosed on Thursday), low grade fever at timesCough, non-productive EXAM: CT ANGIOGRAPHY CHEST WITH CONTRAST TECHNIQUE: Multidetector CT imaging of the chest was performed using the standard protocol during bolus administration of intravenous contrast. Multiplanar CT image reconstructions and MIPs were obtained to evaluate the vascular anatomy. CONTRAST:  100 mL of Isovue 370 intravenous contrast COMPARISON:  Current chest radiograph FINDINGS: Cardiovascular: There is no evidence of a pulmonary embolism. The heart is normal in size. There are no significant coronary artery calcifications. The great vessels are normal in caliber. No significant aortic or branch vessel plaque. Mediastinum/Nodes: Small low-density thyroid lesions consistent with sub cm nodules. No mediastinal or hilar masses. Prominent hilar lymph nodes, largest on the left measuring 9 mm in short axis. Lungs/Pleura: There is multi focal lung consolidation. There is consolidation in both lower lobes, in the left upper lobe lingula and in the anterior inferior right upper lobe and adjacent right middle lobe. There is no pulmonary edema. No lung mass or suspicious nodule. No pleural effusion. Upper Abdomen: No acute abnormality. Musculoskeletal: No chest wall abnormality. No acute or significant osseous findings.  Review of the MIP images confirms the above findings. IMPRESSION: 1. No evidence of a pulmonary embolism. 2. Bilateral areas of lung consolidation consistent with multifocal pneumonia. No pulmonary edema. Electronically Signed   By: Amie Portland M.D.   On: 08/18/2016  12:59    Microbiology: Recent Results (from the past 240 hour(s))  Blood Culture (routine x 2)     Status: None (Preliminary result)   Collection Time: 08/18/16 10:55 AM  Result Value Ref Range Status   Specimen Description BLOOD RIGHT AC  Final   Special Requests BOTTLES DRAWN AEROBIC AND ANAEROBIC EACH  Final   Culture   Final    NO GROWTH 4 DAYS Performed at [facility]    Report Status PENDING  Incomplete  Blood Culture (routine x 2)     Status: None (Preliminary result)   Collection Time: 08/18/16 10:55 AM  Result Value Ref Range Status   Specimen Description BLOOD RIGHT HAND  Final   Special Requests BOTTLES DRAWN AEROBIC ONLY 5 ML  Final   Culture   Final    NO GROWTH 4 DAYS Performed at [facility]    Report Status PENDING  Incomplete  Urine culture     Status: None   Collection Time: 08/18/16  1:17 PM  Result Value Ref Range Status   Specimen Description URINE, RANDOM  Final   Special Requests NONE  Final   Culture NO GROWTH Performed at [facility]   Final   Report Status 08/20/2016 FINAL  Final     Labs: Basic Metabolic Panel:  Recent Labs Lab 08/18/16 1012 08/18/16 1603 08/19/16 0633 08/20/16 0433 08/21/16 0447 08/22/16 0615  NA 136  --  139 139 137 139  K 3.7  --  3.7 3.8 4.0 4.4  CL 102  --  111 108 104 98*  CO2 23  --  22 24 28 29   GLUCOSE 170*  --  102* 100* 94 91  BUN 14  --  6 <5* 6 7  CREATININE 0.87 0.73 0.66 0.61 0.66 0.77  CALCIUM 8.8*  --  7.6* 8.1* 8.3* 8.7*  MG  --   --   --  1.6*  --   --    Liver Function Tests:  Recent Labs Lab 08/18/16 1012  AST 33  ALT 35  ALKPHOS 127*  BILITOT 0.7  PROT 7.2  ALBUMIN 3.1*   No results for input(s): LIPASE, AMYLASE in the last 168 hours. No results for input(s): AMMONIA in the last 168 hours. CBC:  Recent Labs Lab 08/18/16 1012 08/18/16 1603 08/19/16 0633 08/20/16 0433 08/21/16 0447 08/22/16 0615  WBC 12.7* 11.8* 9.9 7.4 7.3 7.4  NEUTROABS 10.0*   --   --   --   --   --   HGB 13.2 11.3* 10.6* 10.4* 11.5* 11.6*  HCT 38.5 33.8* 31.9* 30.8* 35.4* 35.3*  MCV 91.0 92.1 92.2 93.1 94.4 93.1  PLT 437* 364 340 321 388 405*   Cardiac Enzymes: No results for input(s): CKTOTAL, CKMB, CKMBINDEX, TROPONINI in the last 168 hours. BNP: BNP (last 3 results) No results for input(s): BNP in the last 8760 hours.  ProBNP (last 3 results) No results for input(s): PROBNP in the last 8760 hours.  CBG:  Recent Labs Lab 08/19/16 0023  GLUCAP 151*       SignedZannie Cove MD.  [facility] 08/22/2016, 4:08 PM

## 2016-08-22 NOTE — Progress Notes (Signed)
Notified Schorr, NP that pt requesting pain medication. NP ordered Norco x1. Will continue to monitor pt. Nelda Marseille, RN

## 2016-08-22 NOTE — Progress Notes (Signed)
Pt given discharge instructions, prescriptions, and care notes. Pt verbalized understanding AEB no further questions or concerns at this time. IV was discontinued, no redness, pain, or swelling noted at this time. Telemetry discontinued and Centralized Telemetry was notified. Pt left the floor via wheelchair with staff in stable condition. 

## 2016-08-23 LAB — CULTURE, BLOOD (ROUTINE X 2)
CULTURE: NO GROWTH
Culture: NO GROWTH

## 2017-01-30 ENCOUNTER — Encounter (HOSPITAL_BASED_OUTPATIENT_CLINIC_OR_DEPARTMENT_OTHER): Payer: Self-pay

## 2017-01-30 ENCOUNTER — Emergency Department (HOSPITAL_BASED_OUTPATIENT_CLINIC_OR_DEPARTMENT_OTHER)
Admission: EM | Admit: 2017-01-30 | Discharge: 2017-01-30 | Disposition: A | Discharge status: Home / Self Care | Payer: 59 | Attending: Emergency Medicine | Admitting: Emergency Medicine

## 2017-01-30 ENCOUNTER — Emergency Department (HOSPITAL_BASED_OUTPATIENT_CLINIC_OR_DEPARTMENT_OTHER): Payer: 59

## 2017-01-30 DIAGNOSIS — S20221A Contusion of right back wall of thorax, initial encounter: Secondary | ICD-10-CM

## 2017-01-30 DIAGNOSIS — F17210 Nicotine dependence, cigarettes, uncomplicated: Secondary | ICD-10-CM | POA: Diagnosis not present

## 2017-01-30 DIAGNOSIS — S300XXA Contusion of lower back and pelvis, initial encounter: Secondary | ICD-10-CM | POA: Insufficient documentation

## 2017-01-30 DIAGNOSIS — W108XXA Fall (on) (from) other stairs and steps, initial encounter: Secondary | ICD-10-CM | POA: Insufficient documentation

## 2017-01-30 DIAGNOSIS — Y929 Unspecified place or not applicable: Secondary | ICD-10-CM | POA: Diagnosis not present

## 2017-01-30 DIAGNOSIS — R0781 Pleurodynia: Secondary | ICD-10-CM | POA: Diagnosis not present

## 2017-01-30 DIAGNOSIS — Y939 Activity, unspecified: Secondary | ICD-10-CM | POA: Diagnosis not present

## 2017-01-30 DIAGNOSIS — S3992XA Unspecified injury of lower back, initial encounter: Secondary | ICD-10-CM | POA: Diagnosis present

## 2017-01-30 DIAGNOSIS — Y999 Unspecified external cause status: Secondary | ICD-10-CM | POA: Insufficient documentation

## 2017-01-30 DIAGNOSIS — W19XXXA Unspecified fall, initial encounter: Secondary | ICD-10-CM

## 2017-01-30 MED ORDER — OXYCODONE-ACETAMINOPHEN 5-325 MG PO TABS
2.0000 | ORAL_TABLET | Freq: Once | ORAL | Status: AC
  Administered 2017-01-30: 2 via ORAL
  Filled 2017-01-30: qty 2

## 2017-01-30 MED ORDER — HYDROCODONE-ACETAMINOPHEN 5-325 MG PO TABS: 1.0000 | ORAL_TABLET | ORAL | 0 refills | Status: DC | PRN

## 2017-01-30 NOTE — ED Provider Notes (Signed)
[facility] Note   CSN: [id number] Arrival date & time: 01/30/17  1107     History   Chief Complaint Chief Complaint  Patient presents with  . Fall    HPI Cassidy Miles is a 51 y.o. female.  Patient is a 51 year old female with a history of migraines, anxiety, irritable bowel syndrome and hyperlipidemia who presents with back pain. She states that 4 days ago she slipped on some icy steps and fell over onto her right side. She's had ongoing pain to her right lower ribs as well as her right back since that time. There's no radiation down her legs. No numbness or weakness in her legs. No abdominal pain. No nausea or vomiting. No shortness of breath. She states the pain is constant and worse with movement or coughing. She's been taking multiple over-the-counter medications without improvement in symptoms. She denies any head injury. No neck pain.      Past Medical History:  Diagnosis Date  . Anxiety and depression   . GERD (gastroesophageal reflux disease)   . Hypercholesteremia   . IBS (irritable bowel syndrome)   . Migraines   . Panic attack     Patient Active Problem List   Diagnosis Date Noted  . PNA (pneumonia) 08/19/2016  . Community acquired pneumonia 08/18/2016  . Personality disorder 03/12/2013  . Agoraphobia with panic disorder 03/12/2013  . GERD (gastroesophageal reflux disease)   . IBS (irritable bowel syndrome)   . Migraines   . Hypercholesteremia   . Anxiety and depression   . Panic attack   . GERD 02/20/2009  . IRRITABLE BOWEL SYNDROME 02/20/2009    Past Surgical History:  Procedure Laterality Date  . TONSILLECTOMY      OB History    No data available       Home Medications    Prior to Admission medications   Medication Sig Start Date End Date Taking? Authorizing Provider  HYDROcodone-acetaminophen (NORCO/VICODIN) 5-325 MG tablet Take 1-2 tablets by mouth every 4 (four) hours as needed. 01/30/17   Rolan Bucco, MD    sulfamethoxazole-trimethoprim (BACTRIM DS,SEPTRA DS) 800-160 MG tablet Take 1 tablet by mouth every 12 (twelve) hours. For 3days total including today 08/22/16   Zannie Cove, MD    Family History Family History  Problem Relation Age of Onset  . Alzheimer's disease Mother     Social History Social History  Substance Use Topics  . Smoking status: Current Every Day Smoker    Packs/day: 0.50    Types: Cigarettes  . Smokeless tobacco: Never Used  . Alcohol use 0.0 oz/week     Comment: occ     Allergies   Tramadol; Cephalexin; Clarithromycin; Fioricet [butalbital-apap-caffeine]; and Penicillins   Review of Systems Review of Systems  Constitutional: Negative for activity change, appetite change and fever.  HENT: Negative for dental problem, nosebleeds and trouble swallowing.   Eyes: Negative for pain and visual disturbance.  Respiratory: Negative for shortness of breath.   Cardiovascular: Negative for chest pain.  Gastrointestinal: Negative for abdominal pain, nausea and vomiting.  Genitourinary: Negative for dysuria and hematuria.  Musculoskeletal: Positive for back pain. Negative for arthralgias, joint swelling and neck pain.  Skin: Negative for wound.  Neurological: Negative for weakness, numbness and headaches.  Psychiatric/Behavioral: Negative for confusion.     Physical Exam Updated Vital Signs BP 109/81 (BP Location: Left Arm)   Pulse 109   Temp 98.1 F (36.7 C) (Oral)   Resp 24   Ht 5'  5" (1.651 m)   Wt 115 lb (52.2 kg)   LMP 10/14/2014 Comment: irregular  SpO2 100%   BMI 19.14 kg/m   Physical Exam  Constitutional: She is oriented to person, place, and time. She appears well-developed and well-nourished.  HENT:  Head: Normocephalic and atraumatic.  Nose: Nose normal.  Eyes: Conjunctivae are normal. Pupils are equal, round, and reactive to light.  Neck:  No pain to the cervical, thoracic spine. Positive tenderness to the lower lumbosacral spine and  the musculature of the right lower back.  No step-offs or deformities noted. No external trauma noted.  Cardiovascular: Normal rate and regular rhythm.   No murmur heard. No evidence of external trauma to the chest or abdomen  Pulmonary/Chest: Effort normal and breath sounds normal. No respiratory distress. She has no wheezes. She exhibits no tenderness.  Positive tenderness to the right lower ribs, no crepitus or deformity  Abdominal: Soft. Bowel sounds are normal. She exhibits no distension. There is no tenderness.  Musculoskeletal: Normal range of motion.  No pain on palpation or ROM of the extremities, including the hips  Neurological: She is alert and oriented to person, place, and time.  Skin: Skin is warm and dry. Capillary refill takes less than 2 seconds.  Psychiatric: She has a normal mood and affect.  Vitals reviewed.    ED Treatments / Results  Labs (all labs ordered are listed, but only abnormal results are displayed) Labs Reviewed - No data to display  EKG  EKG Interpretation None       Radiology Dg Ribs Unilateral W/chest Right  Result Date: 01/30/2017 CLINICAL DATA:  Pain following fall EXAM: RIGHT RIBS AND CHEST - 3+ VIEW COMPARISON:  Chest radiograph August 18, 2016 and chest CT August 18, 2016 FINDINGS: Frontal chest as well as oblique and cone-down lower rib images were obtained. There is no edema or consolidation. Heart size and pulmonary vascularity are normal. No adenopathy. 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 No pneumothorax or pleural effusion.  No evident rib fracture. IMPRESSION: No demonstrable rib fracture. Lungs clear. 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 Electronically Signed   By: Bretta Bang III M.D.   On: 01/30/2017 11:51   Dg Lumbar Spine Complete  Result Date: 01/30/2017 CLINICAL DATA:  Pain following fall EXAM: LUMBAR SPINE - COMPLETE 4+ VIEW COMPARISON:  None. FINDINGS:  Frontal, lateral, spot lumbosacral lateral, and bilateral oblique views were obtained. There are 5 non-rib-bearing lumbar type vertebral bodies. There is no fracture or spondylolisthesis. Disc spaces appear normal. There is facet osteoarthritic change at L4-5 and L5-S1 bilaterally. 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 No appreciable disc space narrowing. No fracture or spondylolisthesis. Electronically Signed   By: Bretta Bang III M.D.   On: 01/30/2017 11:49    Procedures Procedures (including critical care time)  Medications Ordered in ED Medications  oxyCODONE-acetaminophen (PERCOCET/ROXICET) 5-325 MG per tablet 2 tablet (2 tablets Oral Given 01/30/17 1143)     Initial Impression / Assessment and Plan / ED Course  I have reviewed the triage vital signs and the nursing notes.  Pertinent labs & imaging results that were available during my care of the patient were reviewed by me and considered in my medical decision making (see chart for details).     Patient has no fractures identified. No evidence of pneumothorax. She has no radicular symptoms. She's neurologically intact. No suggestions of cauda equina. She will be  treated with a short course of Vicodin. She has no recent prescriptions in the controlled substance database. She can also continue taking anti-inflammatory medications. She was encouraged to follow-up with her PCP. Return precautions were given.  Final Clinical Impressions(s) / ED Diagnoses   Final diagnoses:  Fall, initial encounter  Contusion of right side of back, initial encounter    New Prescriptions Discharge Medication List as of 01/30/2017 12:10 PM    START taking these medications   Details  HYDROcodone-acetaminophen (NORCO/VICODIN) 5-325 MG tablet Take 1-2 tablets by mouth every 4 (four) hours as needed., Starting Thu 01/30/2017, Print         Rolan Bucco, MD 01/30/17 1248

## 2017-01-30 NOTE — ED Triage Notes (Signed)
Slipped on ice/fell 4 days ago-pain to mid/lower back and right hip-NAD-slow steady gait

## 2017-02-18 ENCOUNTER — Encounter (HOSPITAL_BASED_OUTPATIENT_CLINIC_OR_DEPARTMENT_OTHER): Payer: Self-pay | Admitting: Emergency Medicine

## 2017-02-18 ENCOUNTER — Emergency Department (HOSPITAL_BASED_OUTPATIENT_CLINIC_OR_DEPARTMENT_OTHER)
Admission: EM | Admit: 2017-02-18 | Discharge: 2017-02-18 | Disposition: A | Discharge status: Home / Self Care | Payer: 59 | Attending: Emergency Medicine | Admitting: Emergency Medicine

## 2017-02-18 DIAGNOSIS — F17210 Nicotine dependence, cigarettes, uncomplicated: Secondary | ICD-10-CM | POA: Diagnosis not present

## 2017-02-18 DIAGNOSIS — K029 Dental caries, unspecified: Secondary | ICD-10-CM | POA: Diagnosis not present

## 2017-02-18 DIAGNOSIS — K047 Periapical abscess without sinus: Secondary | ICD-10-CM

## 2017-02-18 DIAGNOSIS — K0889 Other specified disorders of teeth and supporting structures: Secondary | ICD-10-CM | POA: Diagnosis present

## 2017-02-18 MED ORDER — CLINDAMYCIN HCL 300 MG PO CAPS: 300.0000 mg | ORAL_CAPSULE | Freq: Three times a day (TID) | ORAL | 0 refills | Status: DC

## 2017-02-18 MED ORDER — HYDROCODONE-ACETAMINOPHEN 5-325 MG PO TABS: 1.0000 | ORAL_TABLET | ORAL | 0 refills | Status: AC | PRN

## 2017-02-18 MED ORDER — CLINDAMYCIN HCL 150 MG PO CAPS
300.0000 mg | ORAL_CAPSULE | Freq: Once | ORAL | Status: AC
  Administered 2017-02-18: 300 mg via ORAL
  Filled 2017-02-18: qty 2

## 2017-02-18 MED ORDER — KETOROLAC TROMETHAMINE 60 MG/2ML IM SOLN
60.0000 mg | Freq: Once | INTRAMUSCULAR | Status: AC
  Administered 2017-02-18: 60 mg via INTRAMUSCULAR
  Filled 2017-02-18: qty 2

## 2017-02-18 MED ORDER — OXYCODONE-ACETAMINOPHEN 5-325 MG PO TABS
1.0000 | ORAL_TABLET | Freq: Once | ORAL | Status: AC
  Administered 2017-02-18: 1 via ORAL
  Filled 2017-02-18: qty 1

## 2017-02-18 NOTE — ED Triage Notes (Signed)
Pt c/o mouth pain, onset last night; she has several teeth that have been broken, but pain and swelling just started last nigh

## 2017-02-18 NOTE — ED Provider Notes (Signed)
[facility]   CSN: [id number] Arrival date & time: 02/18/17  0900     History   Chief Complaint Chief Complaint  Patient presents with  . Dental Pain    HPI MODEAN YAUGER is a 51 y.o. female.  HPI Patient presents to emergency department with increasing dental pain and facial pain on the left side over the past 24 hours.  She has not seen a dentist for follow-up.  She reports no fevers or chills.  No difficulty breathing or swallowing.  She states that hurts with palpation of tooth #9.  No fevers.  She feels like she started to have some left-sided facial swelling.   Past Medical History:  Diagnosis Date  . Anxiety and depression   . GERD (gastroesophageal reflux disease)   . Hypercholesteremia   . IBS (irritable bowel syndrome)   . Migraines   . Panic attack     Patient Active Problem List   Diagnosis Date Noted  . PNA (pneumonia) 08/19/2016  . Community acquired pneumonia 08/18/2016  . Personality disorder 03/12/2013  . Agoraphobia with panic disorder 03/12/2013  . GERD (gastroesophageal reflux disease)   . IBS (irritable bowel syndrome)   . Migraines   . Hypercholesteremia   . Anxiety and depression   . Panic attack   . GERD 02/20/2009  . IRRITABLE BOWEL SYNDROME 02/20/2009    Past Surgical History:  Procedure Laterality Date  . TONSILLECTOMY      OB History    No data available       Home Medications    Prior to Admission medications   Medication Sig Start Date End Date Taking? Authorizing Provider  clindamycin (CLEOCIN) 300 MG capsule Take 1 capsule (300 mg total) by mouth 3 (three) times daily. 02/18/17   Azalia Bilis, MD  HYDROcodone-acetaminophen (NORCO/VICODIN) 5-325 MG tablet Take 1 tablet by mouth every 4 (four) hours as needed. 02/18/17   Azalia Bilis, MD  sulfamethoxazole-trimethoprim (BACTRIM DS,SEPTRA DS) 800-160 MG tablet Take 1 tablet by mouth every 12 (twelve) hours. For 3days total including today 08/22/16    Zannie Cove, MD    Family History Family History  Problem Relation Age of Onset  . Alzheimer's disease Mother     Social History Social History  Substance Use Topics  . Smoking status: Current Every Day Smoker    Packs/day: 0.50    Types: Cigarettes  . Smokeless tobacco: Never Used  . Alcohol use 0.0 oz/week     Comment: occ     Allergies   Tramadol; Cephalexin; Clarithromycin; Fioricet [butalbital-apap-caffeine]; and Penicillins   Review of Systems Review of Systems  All other systems reviewed and are negative.    Physical Exam Updated Vital Signs BP (!) 176/101 (BP Location: Right Arm)   Pulse (!) 109   Temp 98.6 F (37 C) (Oral)   Resp 18   Ht 5\' 5"  (1.651 m)   Wt 115 lb (52.2 kg)   LMP 10/14/2014 Comment: irregular  SpO2 100%   BMI 19.14 kg/m   Physical Exam  Constitutional: She is oriented to person, place, and time. She appears well-developed and well-nourished.  HENT:  Head: Normocephalic.  Dental care noted tooth #9.  No obvious gingival swelling or fluctuance.  No significant left-sided facial swelling noted.  Anterior neck is normal.  No swelling under her tongue  Eyes: EOM are normal.  Neck: Normal range of motion.  Pulmonary/Chest: Effort normal.  Abdominal: She exhibits no distension.  Musculoskeletal: Normal range  of motion.  Neurological: She is alert and oriented to person, place, and time.  Psychiatric: She has a normal mood and affect.  Nursing note and vitals reviewed.    ED Treatments / Results  Labs (all labs ordered are listed, but only abnormal results are displayed) Labs Reviewed - No data to display  EKG  EKG Interpretation None       Radiology No results found.  Procedures Procedures (including critical care time)  Medications Ordered in ED Medications  clindamycin (CLEOCIN) capsule 300 mg (not administered)  oxyCODONE-acetaminophen (PERCOCET/ROXICET) 5-325 MG per tablet 1 tablet (not administered)    ketorolac (TORADOL) injection 60 mg (not administered)     Initial Impression / Assessment and Plan / ED Course  I have reviewed the triage vital signs and the nursing notes.  Pertinent labs & imaging results that were available during my care of the patient were reviewed by me and considered in my medical decision making (see chart for details).     Dental Pain. Home with antibiotics and pain medicine. Recommend dental follow up.  Also recommended repeat ER evaluation in 48 hours for recheck.  No indication for CT imaging at this time.  No signs of gingival abscess. Tolerating secretions. Airway patent. No sub lingular swelling  She understands to return to the emergency department sooner for any new or worsening symptoms.  Home with clindamycin.   Final Clinical Impressions(s) / ED Diagnoses   Final diagnoses:  Infected dental caries    New Prescriptions New Prescriptions   CLINDAMYCIN (CLEOCIN) 300 MG CAPSULE    Take 1 capsule (300 mg total) by mouth 3 (three) times daily.     Azalia Bilis, MD 02/18/17 [id number]

## 2017-05-27 ENCOUNTER — Emergency Department (HOSPITAL_BASED_OUTPATIENT_CLINIC_OR_DEPARTMENT_OTHER): Payer: Self-pay

## 2017-05-27 ENCOUNTER — Encounter (HOSPITAL_BASED_OUTPATIENT_CLINIC_OR_DEPARTMENT_OTHER): Payer: Self-pay | Admitting: Emergency Medicine

## 2017-05-27 ENCOUNTER — Emergency Department (HOSPITAL_BASED_OUTPATIENT_CLINIC_OR_DEPARTMENT_OTHER)
Admission: EM | Admit: 2017-05-27 | Discharge: 2017-05-27 | Disposition: A | Discharge status: Home / Self Care | Payer: Self-pay | Attending: Emergency Medicine | Admitting: Emergency Medicine

## 2017-05-27 DIAGNOSIS — Y9339 Activity, other involving climbing, rappelling and jumping off: Secondary | ICD-10-CM | POA: Insufficient documentation

## 2017-05-27 DIAGNOSIS — Y92009 Unspecified place in unspecified non-institutional (private) residence as the place of occurrence of the external cause: Secondary | ICD-10-CM | POA: Insufficient documentation

## 2017-05-27 DIAGNOSIS — M79671 Pain in right foot: Secondary | ICD-10-CM

## 2017-05-27 DIAGNOSIS — X509XXA Other and unspecified overexertion or strenuous movements or postures, initial encounter: Secondary | ICD-10-CM | POA: Insufficient documentation

## 2017-05-27 DIAGNOSIS — F17210 Nicotine dependence, cigarettes, uncomplicated: Secondary | ICD-10-CM | POA: Insufficient documentation

## 2017-05-27 DIAGNOSIS — Z79899 Other long term (current) drug therapy: Secondary | ICD-10-CM | POA: Insufficient documentation

## 2017-05-27 DIAGNOSIS — Y999 Unspecified external cause status: Secondary | ICD-10-CM | POA: Insufficient documentation

## 2017-05-27 MED ORDER — HYDROCODONE-ACETAMINOPHEN 5-325 MG PO TABS
1.0000 | ORAL_TABLET | Freq: Once | ORAL | Status: AC
  Administered 2017-05-27: 1 via ORAL
  Filled 2017-05-27: qty 1

## 2017-05-27 NOTE — ED Provider Notes (Signed)
[facility] Provider [facility]   CSN: [id number] Arrival date & time: 05/27/17  0906     History   Chief Complaint Chief Complaint  Patient presents with  . Foot Pain    HPI Cassidy Miles is a 51 y.o. female.  HPI  51 year old Caucasian female presents to the ER with complaints of right foot pain. Patient states that she locked herself out of her house last night and jumped over fence to get into the house. Patient states that she jumped the fence and landed on her right heel had immediate pain. States that she has not been able to bear weight on the right foot since the incident. She has tried several over-the-counter analgesics and ice therapy at home with little relief. She denies any paresthesias, weakness, knee pain, hip pain. Past Medical History:  Diagnosis Date  . Anxiety and depression   . GERD (gastroesophageal reflux disease)   . Hypercholesteremia   . IBS (irritable bowel syndrome)   . Migraines   . Panic attack     Patient Active Problem List   Diagnosis Date Noted  . PNA (pneumonia) 08/19/2016  . Community acquired pneumonia 08/18/2016  . Personality disorder 03/12/2013  . Agoraphobia with panic disorder 03/12/2013  . GERD (gastroesophageal reflux disease)   . IBS (irritable bowel syndrome)   . Migraines   . Hypercholesteremia   . Anxiety and depression   . Panic attack   . GERD 02/20/2009  . IRRITABLE BOWEL SYNDROME 02/20/2009    Past Surgical History:  Procedure Laterality Date  . TONSILLECTOMY      OB History    No data available       Home Medications    Prior to Admission medications   Medication Sig Start Date End Date Taking? Authorizing Provider  clindamycin (CLEOCIN) 300 MG capsule Take 1 capsule (300 mg total) by mouth 3 (three) times daily. 02/18/17   Azalia Bilis, MD  HYDROcodone-acetaminophen (NORCO/VICODIN) 5-325 MG tablet Take 1 tablet by mouth every 4 (four) hours as needed. 02/18/17   Azalia Bilis, MD    sulfamethoxazole-trimethoprim (BACTRIM DS,SEPTRA DS) 800-160 MG tablet Take 1 tablet by mouth every 12 (twelve) hours. For 3days total including today 08/22/16   Zannie Cove, MD    Family History Family History  Problem Relation Age of Onset  . Alzheimer's disease Mother     Social History Social History  Substance Use Topics  . Smoking status: Current Every Day Smoker    Packs/day: 0.50    Types: Cigarettes  . Smokeless tobacco: Never Used  . Alcohol use 0.0 oz/week     Comment: occ     Allergies   Tramadol; Cephalexin; Clarithromycin; Fioricet [butalbital-apap-caffeine]; and Penicillins   Review of Systems Review of Systems  Constitutional: Negative for chills and fever.  Musculoskeletal: Positive for arthralgias and myalgias. Negative for joint swelling.  Skin: Negative for color change and wound.  Neurological: Negative for weakness and numbness.     Physical Exam Updated Vital Signs BP 133/79 (BP Location: Right Arm)   Pulse 88   Temp 97.7 F (36.5 C) (Oral)   Resp 18   Ht 5\' 5"  (1.651 m)   Wt 56.7 kg (125 lb)   LMP 10/14/2014 Comment: irregular  SpO2 100%   BMI 20.80 kg/m   Physical Exam  Constitutional: She appears well-developed and well-nourished. No distress.  HENT:  Head: Normocephalic and atraumatic.  Eyes: Right eye exhibits no discharge. Left eye exhibits no discharge. No scleral  icterus.  Neck: Normal range of motion.  Pulmonary/Chest: No respiratory distress.  Musculoskeletal: Normal range of motion.  Patient with pain to palpation of the right posterior heel. No obvious deformity, ecchymosis, edema noted. Patient has full range of motion of the right ankle. No lateral medial malleolus tenderness. She has no midfoot or fifth metatarsal tenderness. Able to move all phalanges. DP pulses are 2+ bilaterally. Sensation intact. Cap refill normal. Full range of motion of the right knee and right hip without any pain. No obvious deformity, edema,  ecchymosis.  No midline T spine or L spine tenderness. No deformities or step offs noted. Full ROM. Pelvis is stable.   Neurological: She is alert.  Strength 5 out of 5 in lower extremities bilaterally.  Skin: No pallor.  Psychiatric: Her behavior is normal. Judgment and thought content normal.  Nursing note and vitals reviewed.    ED Treatments / Results  Labs (all labs ordered are listed, but only abnormal results are displayed) Labs Reviewed - No data to display  EKG  EKG Interpretation None       Radiology Dg Ankle Complete Right  Result Date: 05/27/2017 CLINICAL DATA:  Jumped over fence yesterday.  Heel pain EXAM: RIGHT ANKLE - COMPLETE 3+ VIEW COMPARISON:  Foot series performed today. FINDINGS: There is no evidence of fracture, dislocation, or joint effusion. There is no evidence of arthropathy or other focal bone abnormality. Soft tissues are unremarkable. No calcaneal abnormality. IMPRESSION: Negative. Electronically Signed   By: Charlett Nose M.D.   On: 05/27/2017 10:15   Dg Foot Complete Right  Result Date: 05/27/2017 CLINICAL DATA:  Jumped over fence, injured right heel. Right heel pain EXAM: RIGHT FOOT COMPLETE - 3+ VIEW COMPARISON:  None. FINDINGS: There is no evidence of fracture or dislocation. There is no evidence of arthropathy or other focal bone abnormality. Soft tissues are unremarkable. No calcaneal abnormality. IMPRESSION: Negative. Electronically Signed   By: Charlett Nose M.D.   On: 05/27/2017 10:14    Procedures Procedures (including critical care time)  Medications Ordered in ED Medications  HYDROcodone-acetaminophen (NORCO/VICODIN) 5-325 MG per tablet 1 tablet (not administered)     Initial Impression / Assessment and Plan / ED Course  I have reviewed the triage vital signs and the nursing notes.  Pertinent labs & imaging results that were available during my care of the patient were reviewed by me and considered in my medical decision making  (see chart for details).     Patient X-Ray negative for obvious fracture or dislocation. Pain managed in ED. Pt advised to follow up with orthopedics if symptoms persist for possibility of missed fracture diagnosis. Patient given brace while in ED, conservative therapy recommended and discussed. Patient will be dc home & is agreeable with above plan.  Final Clinical Impressions(s) / ED Diagnoses   Final diagnoses:  Right foot pain    New Prescriptions New Prescriptions   No medications on file     Wallace Keller 05/27/17 1033    Tegeler, Canary Brim, MD 05/28/17 [phone]

## 2017-05-27 NOTE — Discharge Instructions (Signed)
Motrin and tylenol as needed for pain. Ice affected area (see instructions below).  Please call the orthopedic physician listed if symptoms do not improve.    COLD THERAPY DIRECTIONS:  Ice or gel packs can be used to reduce both pain and swelling. Ice is the most helpful within the first 24 to 48 hours after an injury or flareup from overusing a muscle or joint.  Ice is effective, has very few side effects, and is safe for most people to use.   If you expose your skin to cold temperatures for too long or without the proper protection, you can damage your skin or nerves. Watch for signs of skin damage due to cold.   HOME CARE INSTRUCTIONS  Follow these tips to use ice and cold packs safely.  Place a dry or damp towel between the ice and skin. A damp towel will cool the skin more quickly, so you may need to shorten the time that the ice is used.  For a more rapid response, add gentle compression to the ice.  Ice for no more than 10 to 20 minutes at a time. The bonier the area you are icing, the less time it will take to get the benefits of ice.  Check your skin after 5 minutes to make sure there are no signs of a poor response to cold or skin damage.  Rest 20 minutes or more in between uses.  Once your skin is numb, you can end your treatment. You can test numbness by very lightly touching your skin. The touch should be so light that you do not see the skin dimple from the pressure of your fingertip. When using ice, most people will feel these normal sensations in this order: cold, burning, aching, and numbness.

## 2017-05-27 NOTE — ED Notes (Signed)
Patient transported to X-ray 

## 2017-05-27 NOTE — ED Triage Notes (Signed)
Pt c/o RT foot pain s/p climbing over a fence last night and landing on foot

## 2017-10-07 IMAGING — CT CT ANGIO CHEST
2 of 8 series · 19 of 36 positions shown · IV contrast (isovue)
Comparison: Current chest radiograph

CLINICAL DATA: Pt with SOB, chest wall pain, pneumonia ( diagnosed
on [REDACTED]), low grade fever at timesCough, non-productive

EXAM:
CT ANGIOGRAPHY CHEST WITH CONTRAST
TECHNIQUE: Multidetector CT imaging of the chest was performed using the
standard protocol during bolus administration of intravenous
contrast. Multiplanar CT image reconstructions and MIPs were
obtained to evaluate the vascular anatomy.
CONTRAST:  100 mL of Isovue 370 intravenous contrast

[Series 6: pe thins · axial · 0.64mm/px · z∈[-368,-102]mm · 18 of 298 slices shown]
[im 16/298  lung]
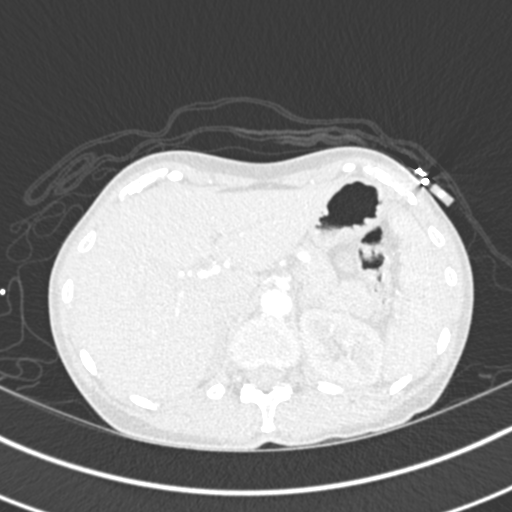
[im 32/298  mediastinal]
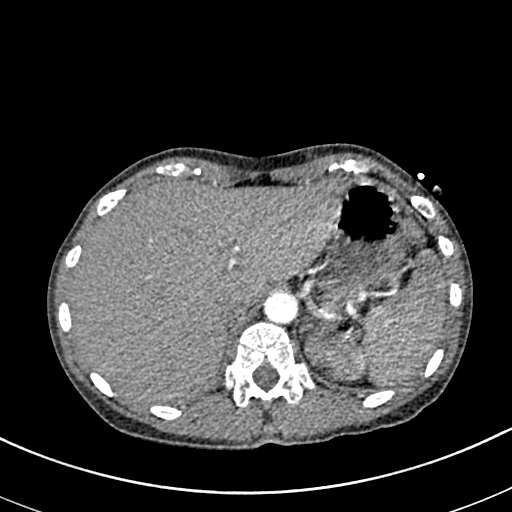
[im 47/298  lung]
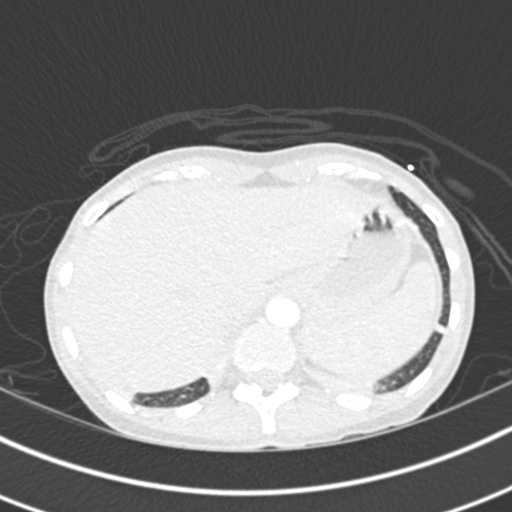
[im 63/298  mediastinal]
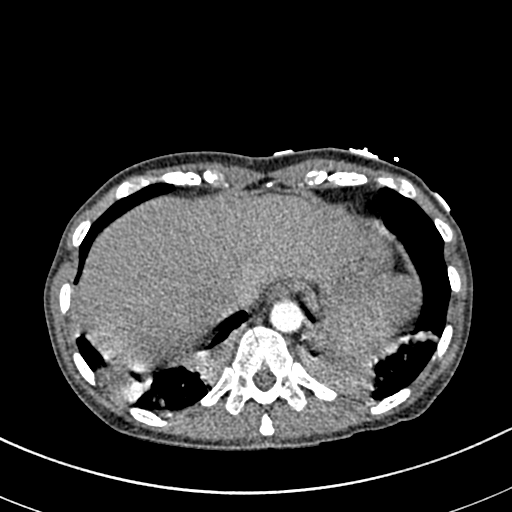
[im 79/298  lung]
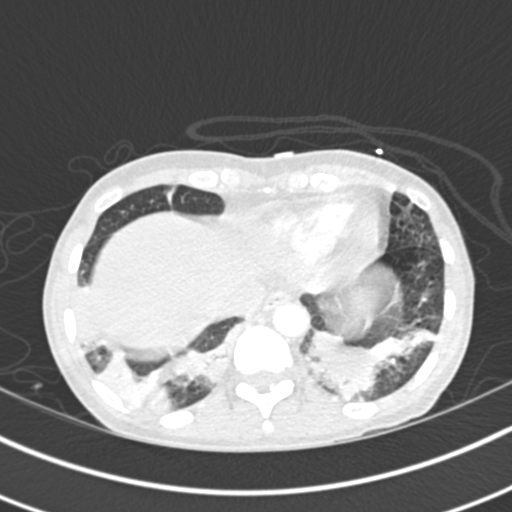
[im 94/298  mediastinal]
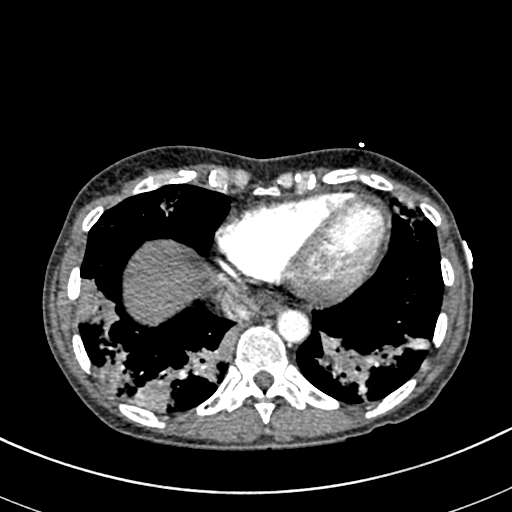
[im 110/298  lung]
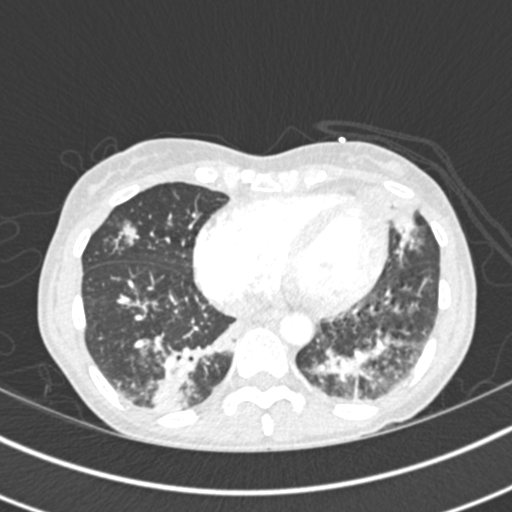
[im 126/298  mediastinal]
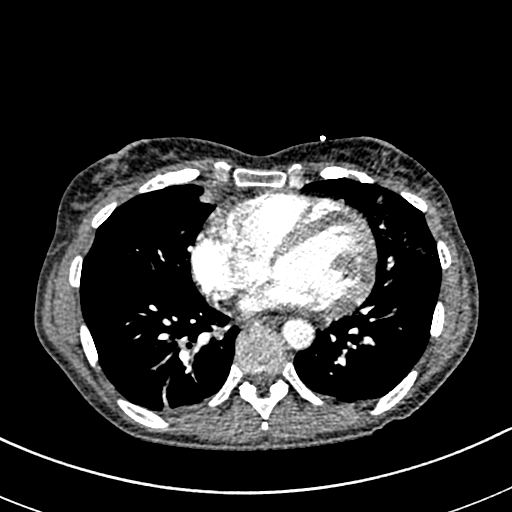
[im 141/298  lung]
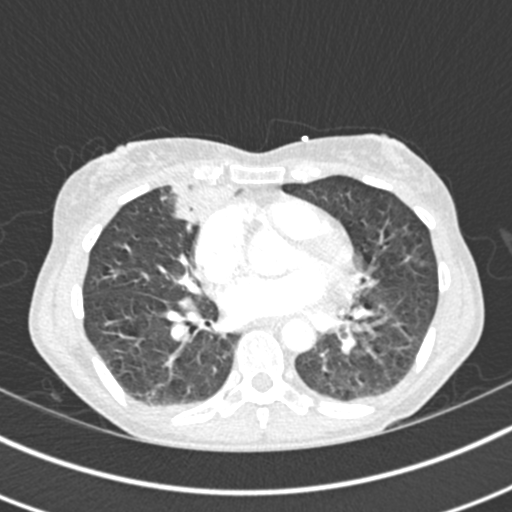
[im 157/298  mediastinal]
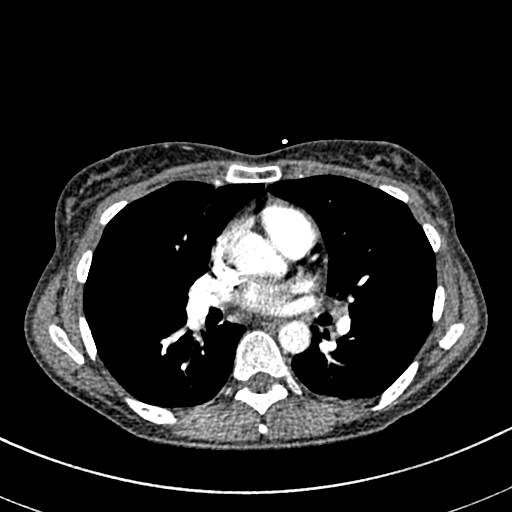
[im 172/298  lung]
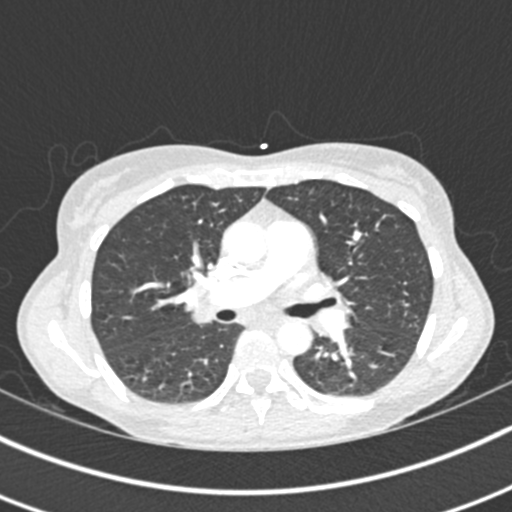
[im 188/298  mediastinal]
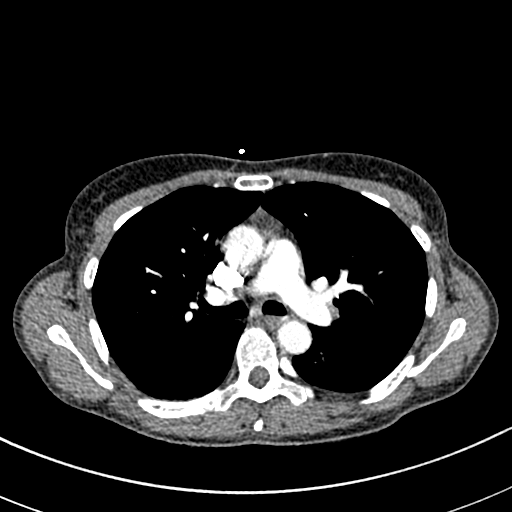
[im 204/298  lung]
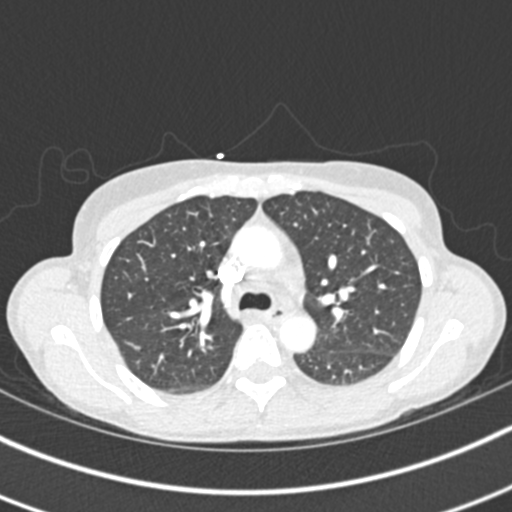
[im 219/298  mediastinal]
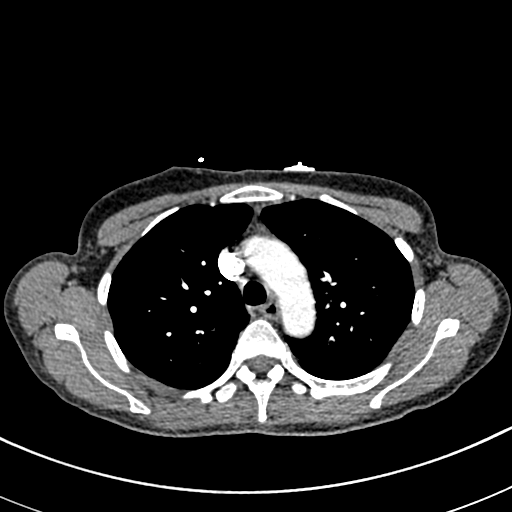
[im 235/298  lung]
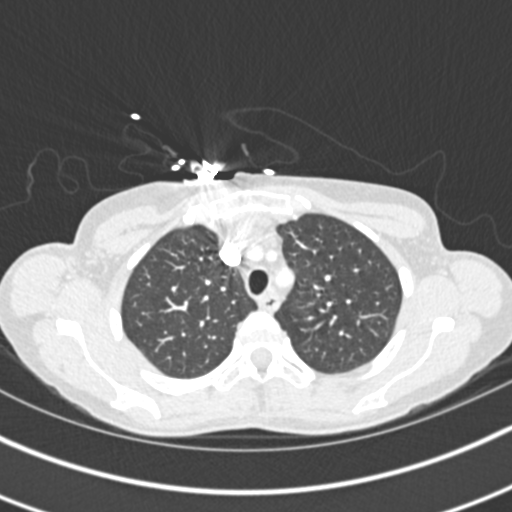
[im 251/298  mediastinal]
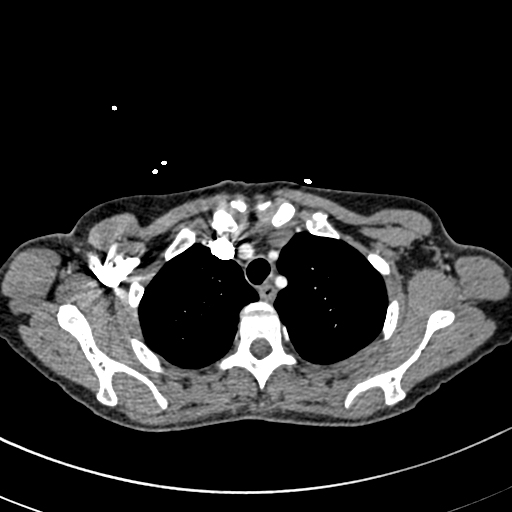
[im 266/298  lung]
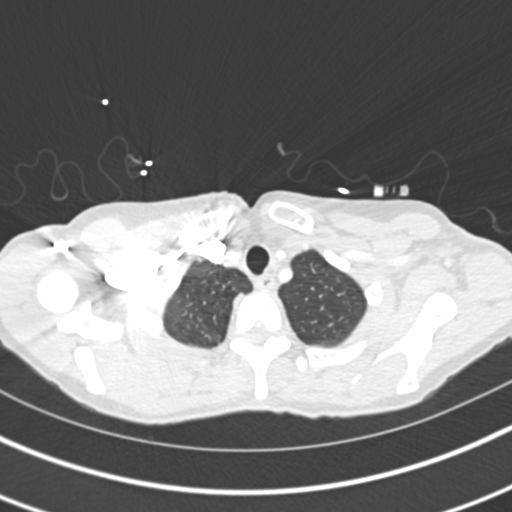
[im 282/298  mediastinal]
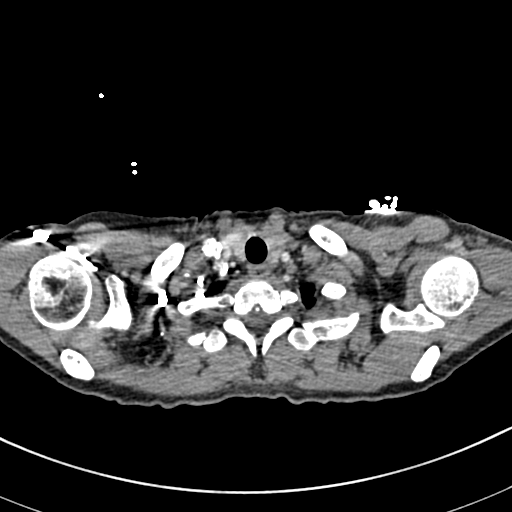

[Series 8: pe coronal mpr · coronal · 0.63mm/px · 1 of 94 slices shown]
[im 47/94  mediastinal]
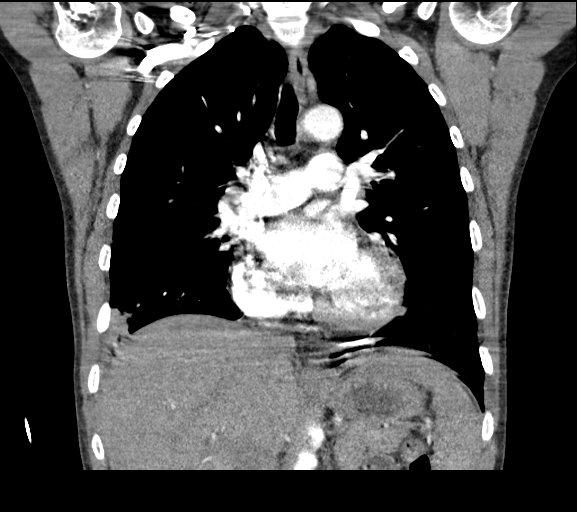

[19 of 36 positions shown; findings below may reference images not displayed]

FINDINGS: Cardiovascular: There is no evidence of a pulmonary embolism. The
heart is normal in size. There are no significant coronary artery
calcifications. The great vessels are normal in caliber. No
significant aortic or branch vessel plaque.

Mediastinum/Nodes: Small low-density thyroid lesions consistent with
sub cm nodules. No mediastinal or hilar masses. Prominent hilar
lymph nodes, largest on the left measuring 9 mm in short axis.

Lungs/Pleura: There is multi focal lung consolidation. There is
consolidation in both lower lobes, in the left upper lobe lingula
and in the anterior inferior right upper lobe and adjacent right
middle lobe. There is no pulmonary edema. No lung mass or suspicious
nodule. No pleural effusion.

Upper Abdomen: No acute abnormality.

Musculoskeletal: No chest wall abnormality. No acute or significant
osseous findings.

Review of the MIP images confirms the above findings.
IMPRESSION: 1. No evidence of a pulmonary embolism.
2. Bilateral areas of lung consolidation consistent with multifocal
pneumonia. No pulmonary edema.

## 2017-10-07 IMAGING — DX DG CHEST 2V
2 series · 2 of 2 positions shown · non-contrast
Comparison: 08/15/2016; 05/29/2014

CLINICAL DATA: Two week history of upper respiratory infection and
productive cough. Fever.

EXAM:
CHEST  2 VIEW

[chest pa]
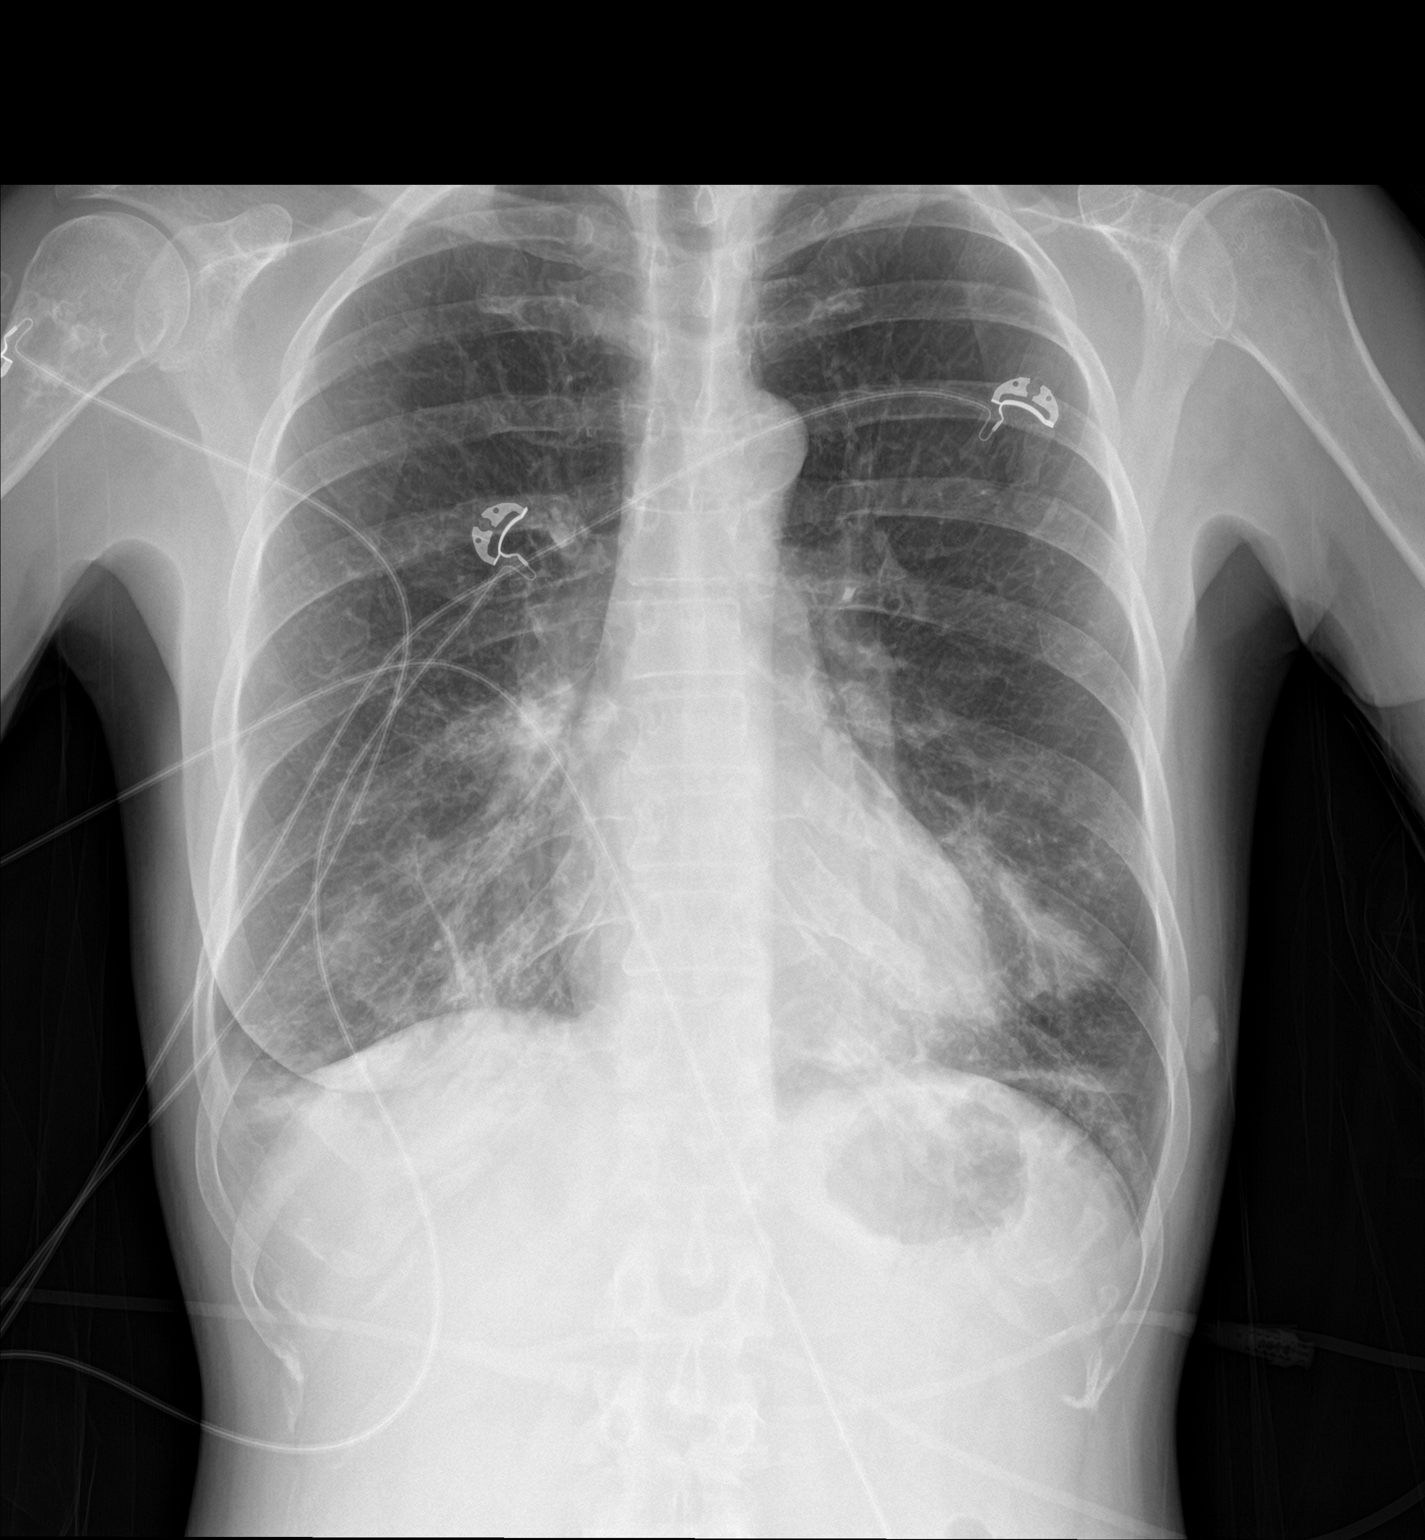

[chest lat]
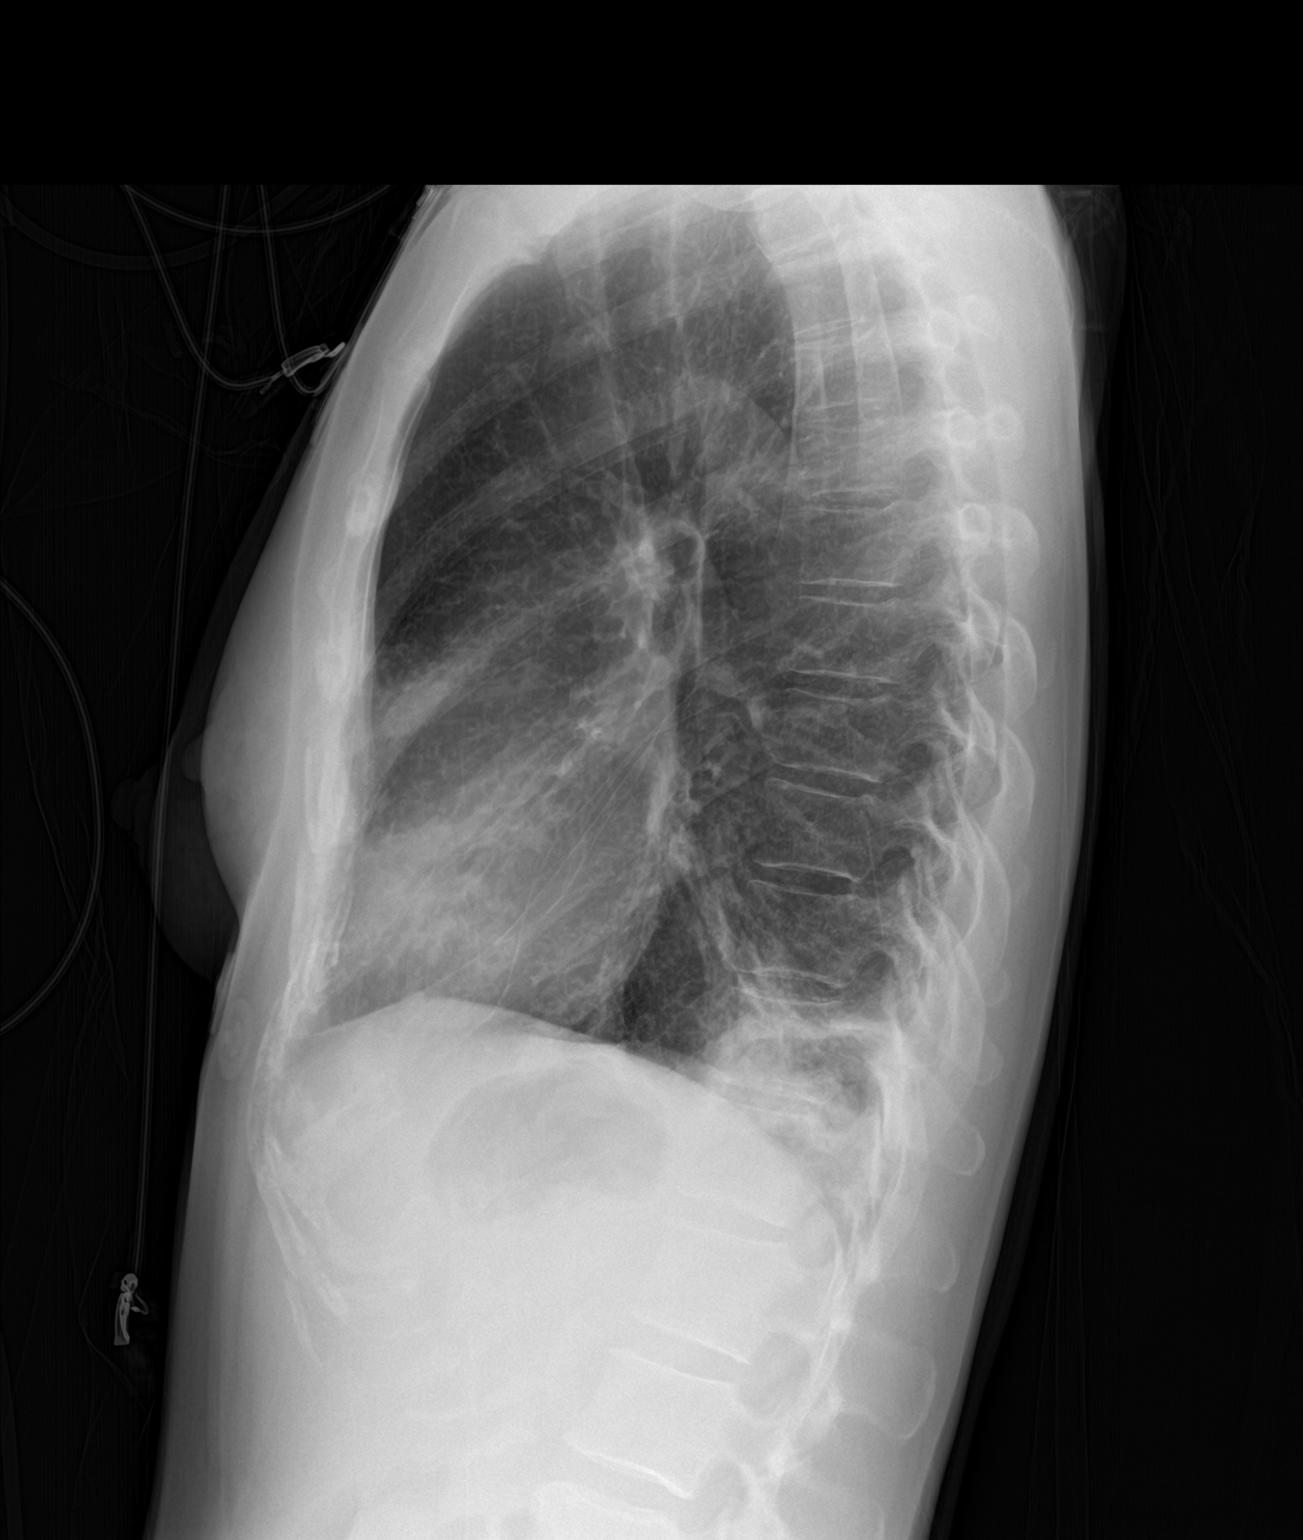

[2 of 2 positions shown; findings below may reference images not displayed]

FINDINGS: Grossly unchanged cardiac silhouette and mediastinal contours with
atherosclerotic plaque within thoracic aorta. Worsening bilateral
lower lung ill-defined heterogeneous airspace opacities. Mild
diffuse slightly nodular thickening of the pulmonary interstitium.
No definite pleural effusion or pneumothorax. No evidence of edema.
No acute osseus abnormalities. Chondroid lesion within the right
humeral metaphysis is grossly unchanged compared to the [DATE]
examination.
IMPRESSION: Findings worrisome for progression of bilateral multi focal lower
lung pneumonia. A follow-up chest radiograph in 3 to 4 weeks after
treatment is recommended to ensure resolution.

## 2017-10-10 ENCOUNTER — Encounter (HOSPITAL_BASED_OUTPATIENT_CLINIC_OR_DEPARTMENT_OTHER): Payer: Self-pay | Admitting: Emergency Medicine

## 2017-10-10 ENCOUNTER — Other Ambulatory Visit: Payer: Self-pay

## 2017-10-10 ENCOUNTER — Emergency Department (HOSPITAL_BASED_OUTPATIENT_CLINIC_OR_DEPARTMENT_OTHER)
Admission: EM | Admit: 2017-10-10 | Discharge: 2017-10-10 | Disposition: A | Discharge status: Home / Self Care | Payer: Self-pay | Attending: Physician Assistant | Admitting: Physician Assistant

## 2017-10-10 DIAGNOSIS — F17210 Nicotine dependence, cigarettes, uncomplicated: Secondary | ICD-10-CM | POA: Insufficient documentation

## 2017-10-10 DIAGNOSIS — Z79899 Other long term (current) drug therapy: Secondary | ICD-10-CM | POA: Insufficient documentation

## 2017-10-10 DIAGNOSIS — K0889 Other specified disorders of teeth and supporting structures: Secondary | ICD-10-CM | POA: Insufficient documentation

## 2017-10-10 DIAGNOSIS — K029 Dental caries, unspecified: Secondary | ICD-10-CM | POA: Insufficient documentation

## 2017-10-10 MED ORDER — OXYCODONE-ACETAMINOPHEN 5-325 MG PO TABS
1.0000 | ORAL_TABLET | Freq: Once | ORAL | Status: AC
  Administered 2017-10-10: 1 via ORAL
  Filled 2017-10-10: qty 1

## 2017-10-10 MED ORDER — TRAMADOL HCL 50 MG PO TABS: 50.0000 mg | ORAL_TABLET | Freq: Four times a day (QID) | ORAL | 0 refills | Status: AC | PRN

## 2017-10-10 MED ORDER — CLINDAMYCIN HCL 300 MG PO CAPS: 300.0000 mg | ORAL_CAPSULE | Freq: Three times a day (TID) | ORAL | 0 refills | Status: AC

## 2017-10-10 MED ORDER — LIDOCAINE VISCOUS 2 % MT SOLN: 20.0000 mL | OROMUCOSAL | 0 refills | Status: AC | PRN

## 2017-10-10 MED ORDER — NAPROXEN 375 MG PO TABS: 375.0000 mg | ORAL_TABLET | Freq: Two times a day (BID) | ORAL | 0 refills | Status: AC

## 2017-10-10 NOTE — Discharge Instructions (Signed)
You have a dental infection. It is very important that you get evaluated by a dentist as soon as possible. Call tomorrow to schedule an appointment. Naproxen and tylenol as needed for pain. Take your full course of antibiotics. Read the instructions below.  Please take the Naproxen as prescribed for pain. Do not take any additional NSAIDs including Motrin, Aleve, Ibuprofen, Advil.   Eat a soft or liquid diet and rinse your mouth out after meals with warm water. You should see a dentist or return here at once if you have increased swelling, increased pain or uncontrolled bleeding from the site of your injury.  SEEK MEDICAL CARE IF:  You have increased pain not controlled with medicines.  You have swelling around your tooth, in your face or neck.  You have bleeding which starts, continues, or gets worse.  You have a fever >101 If you are unable to open your mouth

## 2017-10-10 NOTE — ED Triage Notes (Signed)
L lower dental pain due to broken tooth.

## 2017-10-10 NOTE — ED Provider Notes (Signed)
[facility] EMERGENCY DEPARTMENT Provider Note   CSN: [id number] Arrival date & time: 10/10/17  1526     History   Chief Complaint Chief Complaint  Patient presents with  . Dental Pain    HPI Cassidy Miles is a 51 y.o. female.  HPI 51 year old Caucasian female with no pertinent past medical history presents to the ED with complaints of tooth pain.  Patient states that she broke part of her back left lower molar off approximately 3 months ago.  Patient states that since then she has developed some pain starting 3 days ago.  States that it is excruciating today which is why she came to the ED for evaluation.  She is tried over-the-counter Orajel with only little relief.  Patient states she is been trying to call several dentist to get into see them however they would not take her without any insurance.  Patient denies any associated fever, chills, facial swelling or difficulty breathing, difficulty swallowing.  Palpation, chewing makes the pain worse. Past Medical History:  Diagnosis Date  . Anxiety and depression   . GERD (gastroesophageal reflux disease)   . Hypercholesteremia   . IBS (irritable bowel syndrome)   . Migraines   . Panic attack     Patient Active Problem List   Diagnosis Date Noted  . PNA (pneumonia) 08/19/2016  . Community acquired pneumonia 08/18/2016  . Personality disorder (HCC) 03/12/2013  . Agoraphobia with panic disorder 03/12/2013  . GERD (gastroesophageal reflux disease)   . IBS (irritable bowel syndrome)   . Migraines   . Hypercholesteremia   . Anxiety and depression   . Panic attack   . GERD 02/20/2009  . IRRITABLE BOWEL SYNDROME 02/20/2009    Past Surgical History:  Procedure Laterality Date  . TONSILLECTOMY      OB History    No data available       Home Medications    Prior to Admission medications   Medication Sig Start Date End Date Taking? Authorizing Provider  clindamycin (CLEOCIN) 300 MG capsule Take 1 capsule  (300 mg total) by mouth 3 (three) times daily. 10/10/17   Rise Mu, PA-C  HYDROcodone-acetaminophen (NORCO/VICODIN) 5-325 MG tablet Take 1 tablet by mouth every 4 (four) hours as needed. 02/18/17   Azalia Bilis, MD  lidocaine (XYLOCAINE) 2 % solution Use as directed 20 mLs in the mouth or throat as needed for mouth pain. 10/10/17   Rise Mu, PA-C  naproxen (NAPROSYN) 375 MG tablet Take 1 tablet (375 mg total) by mouth 2 (two) times daily. 10/10/17   Rise Mu, PA-C  sulfamethoxazole-trimethoprim (BACTRIM DS,SEPTRA DS) 800-160 MG tablet Take 1 tablet by mouth every 12 (twelve) hours. For 3days total including today 08/22/16   Zannie Cove, MD    Family History Family History  Problem Relation Age of Onset  . Alzheimer's disease Mother     Social History Social History   Tobacco Use  . Smoking status: Current Every Day Smoker    Packs/day: 0.50    Types: Cigarettes  . Smokeless tobacco: Never Used  Substance Use Topics  . Alcohol use: Yes    Alcohol/week: 0.0 oz    Comment: occ  . Drug use: No     Allergies   Tramadol; Cephalexin; Clarithromycin; Fioricet [butalbital-apap-caffeine]; and Penicillins   Review of Systems Review of Systems  Constitutional: Negative for chills and fever.  HENT: Positive for dental problem. Negative for facial swelling and trouble swallowing.   Gastrointestinal: Negative  for nausea and vomiting.  Musculoskeletal: Negative for neck pain and neck stiffness.  Skin: Negative for rash.     Physical Exam Updated Vital Signs BP (!) 130/91 (BP Location: Left Arm)   Pulse (!) 101   Temp 98.1 F (36.7 C) (Oral)   Resp 18   Ht 5\' 5"  (1.651 m)   Wt 54.4 kg (120 lb)   LMP 10/14/2014 Comment: irregular  SpO2 100%   BMI 19.97 kg/m   Physical Exam  Constitutional: She appears well-developed and well-nourished. No distress.  HENT:  Head: Normocephalic and atraumatic.  Mouth/Throat: Oropharynx is clear and moist and  mucous membranes are normal. No trismus in the jaw. No uvula swelling.    Patient has no facial swelling, no sublingual or submandibular swelling.  Oropharynx is clear.  Managing secretions and tolerating airway.  Speaking complete sentences.  Eyes: Right eye exhibits no discharge. Left eye exhibits no discharge. No scleral icterus.  Neck: Normal range of motion.  Pulmonary/Chest: No respiratory distress.  Musculoskeletal: Normal range of motion.  Neurological: She is alert.  Skin: Skin is warm and dry. Capillary refill takes less than 2 seconds. No pallor.  Psychiatric: Her behavior is normal. Judgment and thought content normal.  Nursing note and vitals reviewed.    ED Treatments / Results  Labs (all labs ordered are listed, but only abnormal results are displayed) Labs Reviewed - No data to display  EKG  EKG Interpretation None       Radiology No results found.  Procedures Procedures (including critical care time)  Medications Ordered in ED Medications  oxyCODONE-acetaminophen (PERCOCET/ROXICET) 5-325 MG per tablet 1 tablet (not administered)     Initial Impression / Assessment and Plan / ED Course  I have reviewed the triage vital signs and the nursing notes.  Pertinent labs & imaging results that were available during my care of the patient were reviewed by me and considered in my medical decision making (see chart for details).     Patient with toothache.  No gross abscess.  Exam unconcerning for Ludwig's angina or spread of infection.  Will treat with penicillin and pain medicine.  Urged patient to follow-up with dentist.     Final Clinical Impressions(s) / ED Diagnoses   Final diagnoses:  Pain, dental    ED Discharge Orders        Ordered    clindamycin (CLEOCIN) 300 MG capsule  3 times daily     10/10/17 1648    naproxen (NAPROSYN) 375 MG tablet  2 times daily     10/10/17 1648    lidocaine (XYLOCAINE) 2 % solution  As needed     10/10/17 1648        Rise MuLeaphart, Dan Dissinger T, PA-C 10/10/17 1656    Mackuen, Cindee Saltourteney Lyn, MD 10/10/17 2254

## 2019-12-15 ENCOUNTER — Other Ambulatory Visit: Payer: Self-pay | Admitting: *Deleted

## 2019-12-15 DIAGNOSIS — Z20822 Contact with and (suspected) exposure to covid-19: Secondary | ICD-10-CM

## 2019-12-17 LAB — NOVEL CORONAVIRUS, NAA: SARS-CoV-2, NAA: NOT DETECTED

## 2020-07-05 ENCOUNTER — Encounter (HOSPITAL_BASED_OUTPATIENT_CLINIC_OR_DEPARTMENT_OTHER): Payer: Self-pay | Admitting: Emergency Medicine

## 2020-07-05 ENCOUNTER — Other Ambulatory Visit: Payer: Self-pay

## 2020-07-05 ENCOUNTER — Emergency Department (HOSPITAL_BASED_OUTPATIENT_CLINIC_OR_DEPARTMENT_OTHER)
Admission: EM | Admit: 2020-07-05 | Discharge: 2020-07-05 | Disposition: A | Discharge status: Home / Self Care | Payer: Self-pay | Attending: Emergency Medicine | Admitting: Emergency Medicine

## 2020-07-05 ENCOUNTER — Emergency Department (HOSPITAL_BASED_OUTPATIENT_CLINIC_OR_DEPARTMENT_OTHER): Payer: Self-pay

## 2020-07-05 DIAGNOSIS — I219 Acute myocardial infarction, unspecified: Secondary | ICD-10-CM | POA: Insufficient documentation

## 2020-07-05 DIAGNOSIS — Z87891 Personal history of nicotine dependence: Secondary | ICD-10-CM | POA: Insufficient documentation

## 2020-07-05 DIAGNOSIS — F410 Panic disorder [episodic paroxysmal anxiety] without agoraphobia: Secondary | ICD-10-CM | POA: Insufficient documentation

## 2020-07-05 DIAGNOSIS — R072 Precordial pain: Secondary | ICD-10-CM | POA: Insufficient documentation

## 2020-07-05 HISTORY — DX: Acute myocardial infarction, unspecified: I21.9

## 2020-07-05 LAB — D-DIMER, QUANTITATIVE: D-Dimer, Quant: <0.27 ug/mL-FEU (ref 0.00–0.50)

## 2020-07-05 LAB — TROPONIN I (HIGH SENSITIVITY): Troponin I (High Sensitivity): 3 ng/L (ref <18)

## 2020-07-05 MED ORDER — ONDANSETRON HCL 4 MG/2ML IJ SOLN
4.0000 mg | Freq: Once | INTRAMUSCULAR | Status: AC
  Administered 2020-07-05: 4 mg via INTRAVENOUS
  Filled 2020-07-05: qty 2

## 2020-07-05 MED ORDER — SODIUM CHLORIDE 0.9 % IV BOLUS
500.0000 mL | Freq: Once | INTRAVENOUS | Status: AC
  Administered 2020-07-05: 500 mL via INTRAVENOUS

## 2020-07-05 MED ORDER — FENTANYL CITRATE (PF) 100 MCG/2ML IJ SOLN
50.0000 ug | Freq: Once | INTRAMUSCULAR | Status: AC
  Administered 2020-07-05: 50 ug via INTRAVENOUS
  Filled 2020-07-05: qty 2

## 2020-07-05 NOTE — ED Triage Notes (Addendum)
Chest pain sob for 3 days.PT been at [facility] since 0900  Today.came here because of wait.  No acute SOB VSS. EKG done in triage. PT had blood work done at [facility] at 1146 AM today.

## 2020-07-05 NOTE — ED Triage Notes (Signed)
PT seen by Cardiologist this am. [facility].

## 2020-07-05 NOTE — Discharge Instructions (Signed)
You were seen in the emergency room today with chest pain.  Your heart enzymes are normal and blood clot labs are also normal.  Please follow closely with your primary care doctor as well as your cardiology team.  Return to the emergency department any new or suddenly worsening symptoms.

## 2020-07-05 NOTE — ED Notes (Signed)
Upon entering room pt had disconnected herself from monitor. Pt very angry with doctor due to not receiving additional pain medication. Pt states "take this shit out of my arm, I'm ready to leave." IV removed and pt given DC papers. Pt ambulatory out of department without difficulty with a swift pace.

## 2020-07-05 NOTE — ED Provider Notes (Signed)
Emergency Department Provider Note   I have reviewed the triage vital signs and the nursing notes.   HISTORY  Chief Complaint No chief complaint on file.   HPI Cassidy Miles is a 54 y.o. female with past medical history reviewed below presents to the emergency department with chest discomfort.  Patient is followed by cardiology at [facility].  She has had chest pain with some associated shortness of breath over the past 3 days which been constant last 12 hours.  She initially presented to [facility] today with initial lab work drawn but ultimately left without being seen due to [facility] wait times.  She denies any active shortness of breath but continues to have some central, nonradiating chest discomfort which feels sharp but also pressure at times.  No diaphoresis or nausea/vomiting.   Past Medical History:  Diagnosis Date  . Anxiety and depression   . GERD (gastroesophageal reflux disease)   . Hypercholesteremia   . IBS (irritable bowel syndrome)   . MI (myocardial infarction) (HCC)   . Migraines   . Panic attack     Patient Active Problem List   Diagnosis Date Noted  . PNA (pneumonia) 08/19/2016  . Community acquired pneumonia 08/18/2016  . Personality disorder (HCC) 03/12/2013  . Agoraphobia with panic disorder 03/12/2013  . GERD (gastroesophageal reflux disease)   . IBS (irritable bowel syndrome)   . Migraines   . Hypercholesteremia   . Anxiety and depression   . Panic attack   . GERD 02/20/2009  . IRRITABLE BOWEL SYNDROME 02/20/2009    Past Surgical History:  Procedure Laterality Date  . CORONARY ANGIOPLASTY WITH STENT PLACEMENT    . TONSILLECTOMY      Allergies Tramadol, Cephalexin, Clarithromycin, Fioricet [butalbital-apap-caffeine], Penicillins, and Naproxen  Family History  Problem Relation Age of Onset  . Alzheimer's disease Mother     Social History Social History   Tobacco Use  . Smoking status: Former Smoker    Packs/day: 0.50     Types: Cigarettes  . Smokeless tobacco: Never Used  Vaping Use  . Vaping Use: Never used  Substance Use Topics  . Alcohol use: Yes    Comment: occ  . Drug use: No    Review of Systems  Constitutional: No fever/chills Eyes: No visual changes. ENT: No sore throat. Cardiovascular: Positive chest pain. Respiratory: Positive shortness of breath. Gastrointestinal: No abdominal pain.  No nausea, no vomiting.  No diarrhea.  No constipation. Genitourinary: Negative for dysuria. Musculoskeletal: Negative for back pain. Skin: Negative for rash. Neurological: Negative for headaches, focal weakness or numbness.  10-point ROS otherwise negative.  ____________________________________________   PHYSICAL EXAM:  VITAL SIGNS: ED Triage Vitals  Enc Vitals Group     BP 07/05/20 1528 131/89     Pulse Rate 07/05/20 1528 88     Resp 07/05/20 1528 20     Temp 07/05/20 1528 98.7 F (37.1 C)     Temp Source 07/05/20 1528 Oral     SpO2 07/05/20 1528 97 %     Weight 07/05/20 1529 120 lb 2.4 oz (54.5 kg)   Constitutional: Alert and oriented. Well appearing and in no acute distress. Eyes: Conjunctivae are normal. Head: Atraumatic. Nose: No congestion/rhinnorhea. Mouth/Throat: Mucous membranes are moist.   Neck: No stridor.   Cardiovascular: Normal rate, regular rhythm. Good peripheral circulation. Grossly normal heart sounds.   Respiratory: Normal respiratory effort.  No retractions. Lungs CTAB. Gastrointestinal: Soft and nontender. No distention.  Musculoskeletal: No lower extremity  tenderness nor edema. No gross deformities of extremities. Neurologic:  Normal speech and language. No gross focal neurologic deficits are appreciated.  Skin:  Skin is warm, dry and intact. No rash noted.  ____________________________________________   LABS (all labs ordered are listed, but only abnormal results are displayed)  Labs Reviewed  D-DIMER, QUANTITATIVE (NOT AT [facility])  TROPONIN I (HIGH  SENSITIVITY)   ____________________________________________  EKG   EKG Interpretation  Date/Time:  Wednesday July 05 2020 15:29:45 EDT Ventricular Rate:  79 PR Interval:  114 QRS Duration: 76 QT Interval:  374 QTC Calculation: 428 R Axis:   88 Text Interpretation: Normal sinus rhythm Normal ECG No STEMI Confirmed by Alona Bene [phone]) on 07/05/2020 8:13:47 PM       ____________________________________________  RADIOLOGY  DG Chest 2 View  Result Date: 07/05/2020 CLINICAL DATA:  Chest pain EXAM: CHEST - 2 VIEW COMPARISON:  Earlier same day FINDINGS: The heart size and mediastinal contours are within normal limits. Both lungs are clear. No pleural effusion or pneumothorax. The visualized skeletal structures are unremarkable. IMPRESSION: No acute process in the chest. Electronically Signed   By: Guadlupe Spanish M.D.   On: 07/05/2020 16:10    ____________________________________________   PROCEDURES  Procedure(s) performed:   Procedures  None  ____________________________________________   INITIAL IMPRESSION / ASSESSMENT AND PLAN / ED COURSE  Pertinent labs & imaging results that were available during my care of the patient were reviewed by me and considered in my medical decision making (see chart for details).   Patient presents to the emergency department with fairly atypical chest pain with some shortness of breath.  I reviewed the lab work from earlier today at [facility] including a negative troponin x1.  Second troponin ordered here along with D-dimer given slightly sharp discomfort with shortness of breath.  That too is negative and patient is overall lower risk by [place].  EKG shows no acute ischemic change.  Plan for continued PCP and cardiology follow-up as an outpatient.  Discussed ED return precautions as well.   ____________________________________________  FINAL CLINICAL IMPRESSION(S) / ED DIAGNOSES  Final diagnoses:  Precordial chest pain      MEDICATIONS GIVEN DURING THIS VISIT:  Medications  sodium chloride 0.9 % bolus 500 mL ( Intravenous Stopped 07/05/20 2141)  ondansetron (ZOFRAN) injection 4 mg (4 mg Intravenous Given 07/05/20 2051)  fentaNYL (SUBLIMAZE) injection 50 mcg (50 mcg Intravenous Given 07/05/20 2052)    Note:  This document was prepared using Dragon voice recognition software and may include unintentional dictation errors.  Alona Bene, MD, [facility]    Alishba Naples, Arlyss Repress, MD 07/10/20 [phone]
